# Patient Record
Sex: Female | Born: 1945
Health system: Southern US, Community
[De-identification: ages and names within clinical notes are randomized; demographics above are authoritative.]

## PROBLEM LIST (undated history)

## (undated) DIAGNOSIS — H6690 Otitis media, unspecified, unspecified ear: Secondary | ICD-10-CM

## (undated) DIAGNOSIS — E7800 Pure hypercholesterolemia, unspecified: Secondary | ICD-10-CM

## (undated) DIAGNOSIS — C189 Malignant neoplasm of colon, unspecified: Secondary | ICD-10-CM

## (undated) DIAGNOSIS — F419 Anxiety disorder, unspecified: Secondary | ICD-10-CM

## (undated) HISTORY — DX: Anxiety disorder, unspecified: F41.9

## (undated) HISTORY — DX: Malignant neoplasm of colon, unspecified: C18.9

## (undated) HISTORY — DX: Otitis media, unspecified, unspecified ear: H66.90

## (undated) HISTORY — DX: Pure hypercholesterolemia, unspecified: E78.00

---

## 1977-09-20 HISTORY — PX: VAGINAL HYSTERECTOMY: SHX2639

## 2000-09-29 ENCOUNTER — Other Ambulatory Visit: Admission: RE | Admit: 2000-09-29 | Discharge: 2000-09-29 | Payer: Self-pay | Admitting: Obstetrics and Gynecology

## 2000-09-29 ENCOUNTER — Encounter (INDEPENDENT_AMBULATORY_CARE_PROVIDER_SITE_OTHER): Payer: Self-pay | Admitting: Specialist

## 2001-12-04 ENCOUNTER — Ambulatory Visit (HOSPITAL_COMMUNITY): Admission: RE | Admit: 2001-12-04 | Discharge: 2001-12-04 | Payer: Self-pay | Admitting: Family Medicine

## 2001-12-04 ENCOUNTER — Encounter: Payer: Self-pay | Admitting: Family Medicine

## 2002-03-28 ENCOUNTER — Other Ambulatory Visit: Admission: RE | Admit: 2002-03-28 | Discharge: 2002-03-28 | Payer: Self-pay | Admitting: Obstetrics and Gynecology

## 2003-11-15 ENCOUNTER — Other Ambulatory Visit: Admission: RE | Admit: 2003-11-15 | Discharge: 2003-11-15 | Payer: Self-pay | Admitting: Obstetrics and Gynecology

## 2004-11-09 ENCOUNTER — Other Ambulatory Visit: Admission: RE | Admit: 2004-11-09 | Discharge: 2004-11-09 | Payer: Self-pay | Admitting: Obstetrics and Gynecology

## 2005-01-22 ENCOUNTER — Ambulatory Visit (HOSPITAL_COMMUNITY): Admission: RE | Admit: 2005-01-22 | Discharge: 2005-01-22 | Payer: Self-pay | Admitting: Gastroenterology

## 2005-01-22 ENCOUNTER — Encounter (INDEPENDENT_AMBULATORY_CARE_PROVIDER_SITE_OTHER): Payer: Self-pay | Admitting: *Deleted

## 2005-01-28 ENCOUNTER — Encounter: Admission: RE | Admit: 2005-01-28 | Discharge: 2005-01-28 | Payer: Self-pay | Admitting: Gastroenterology

## 2005-02-08 ENCOUNTER — Ambulatory Visit (HOSPITAL_COMMUNITY): Admission: RE | Admit: 2005-02-08 | Discharge: 2005-02-08 | Payer: Self-pay | Admitting: Gastroenterology

## 2005-02-09 ENCOUNTER — Encounter (INDEPENDENT_AMBULATORY_CARE_PROVIDER_SITE_OTHER): Payer: Self-pay | Admitting: Specialist

## 2005-02-09 ENCOUNTER — Inpatient Hospital Stay (HOSPITAL_COMMUNITY): Admission: RE | Admit: 2005-02-09 | Discharge: 2005-02-15 | Payer: Self-pay | Admitting: Surgery

## 2005-02-12 ENCOUNTER — Ambulatory Visit: Payer: Self-pay | Admitting: Hematology and Oncology

## 2005-03-01 ENCOUNTER — Ambulatory Visit (HOSPITAL_COMMUNITY): Admission: RE | Admit: 2005-03-01 | Discharge: 2005-03-01 | Payer: Self-pay | Admitting: Hematology and Oncology

## 2005-03-05 ENCOUNTER — Ambulatory Visit (HOSPITAL_COMMUNITY): Admission: RE | Admit: 2005-03-05 | Discharge: 2005-03-05 | Payer: Self-pay | Admitting: Surgery

## 2005-03-05 ENCOUNTER — Ambulatory Visit (HOSPITAL_BASED_OUTPATIENT_CLINIC_OR_DEPARTMENT_OTHER): Admission: RE | Admit: 2005-03-05 | Discharge: 2005-03-05 | Payer: Self-pay | Admitting: Surgery

## 2005-04-05 ENCOUNTER — Ambulatory Visit: Payer: Self-pay | Admitting: Hematology and Oncology

## 2005-05-28 ENCOUNTER — Ambulatory Visit: Payer: Self-pay | Admitting: Hematology and Oncology

## 2005-06-22 ENCOUNTER — Ambulatory Visit (HOSPITAL_COMMUNITY): Admission: RE | Admit: 2005-06-22 | Discharge: 2005-06-22 | Payer: Self-pay | Admitting: Hematology and Oncology

## 2005-07-13 ENCOUNTER — Ambulatory Visit: Payer: Self-pay | Admitting: Hematology and Oncology

## 2005-09-06 ENCOUNTER — Ambulatory Visit: Payer: Self-pay | Admitting: Hematology and Oncology

## 2005-10-04 ENCOUNTER — Ambulatory Visit (HOSPITAL_COMMUNITY): Admission: RE | Admit: 2005-10-04 | Discharge: 2005-10-04 | Payer: Self-pay | Admitting: Hematology and Oncology

## 2005-10-11 ENCOUNTER — Ambulatory Visit (HOSPITAL_BASED_OUTPATIENT_CLINIC_OR_DEPARTMENT_OTHER): Admission: RE | Admit: 2005-10-11 | Discharge: 2005-10-11 | Payer: Self-pay | Admitting: Surgery

## 2006-01-05 ENCOUNTER — Ambulatory Visit: Payer: Self-pay | Admitting: Hematology and Oncology

## 2006-01-06 LAB — COMPREHENSIVE METABOLIC PANEL
ALT: 37 U/L (ref 0–40)
Alkaline Phosphatase: 115 U/L (ref 39–117)
CO2: 27 mEq/L (ref 19–32)
Creatinine, Ser: 0.7 mg/dL (ref 0.4–1.2)
Potassium: 4.3 mEq/L (ref 3.5–5.3)
Total Bilirubin: 1.1 mg/dL (ref 0.3–1.2)
Total Protein: 7.3 g/dL (ref 6.0–8.3)

## 2006-01-06 LAB — CBC WITH DIFFERENTIAL/PLATELET
Basophils Absolute: 0 10*3/uL (ref 0.0–0.1)
Eosinophils Absolute: 0.1 10*3/uL (ref 0.0–0.5)
HCT: 37.6 % (ref 34.8–46.6)
HGB: 13 g/dL (ref 11.6–15.9)
MONO#: 0.4 10*3/uL (ref 0.1–0.9)
NEUT#: 2.2 10*3/uL (ref 1.5–6.5)
NEUT%: 55.6 % (ref 39.6–76.8)
RDW: 13.2 % (ref 11.3–14.5)
lymph#: 1.2 10*3/uL (ref 0.9–3.3)

## 2006-01-11 ENCOUNTER — Ambulatory Visit (HOSPITAL_COMMUNITY): Admission: RE | Admit: 2006-01-11 | Discharge: 2006-01-11 | Payer: Self-pay | Admitting: Hematology and Oncology

## 2006-02-15 ENCOUNTER — Other Ambulatory Visit: Admission: RE | Admit: 2006-02-15 | Discharge: 2006-02-15 | Payer: Self-pay | Admitting: Obstetrics and Gynecology

## 2006-03-01 ENCOUNTER — Ambulatory Visit (HOSPITAL_COMMUNITY): Admission: RE | Admit: 2006-03-01 | Discharge: 2006-03-01 | Payer: Self-pay | Admitting: Endocrinology

## 2006-03-14 ENCOUNTER — Ambulatory Visit (HOSPITAL_COMMUNITY): Admission: RE | Admit: 2006-03-14 | Discharge: 2006-03-14 | Payer: Self-pay | Admitting: Endocrinology

## 2006-03-14 ENCOUNTER — Encounter (INDEPENDENT_AMBULATORY_CARE_PROVIDER_SITE_OTHER): Payer: Self-pay | Admitting: Specialist

## 2006-05-06 ENCOUNTER — Ambulatory Visit: Payer: Self-pay | Admitting: Hematology and Oncology

## 2006-05-13 LAB — CBC WITH DIFFERENTIAL/PLATELET
Basophils Absolute: 0 10*3/uL (ref 0.0–0.1)
EOS%: 2.5 % (ref 0.0–7.0)
Eosinophils Absolute: 0.1 10*3/uL (ref 0.0–0.5)
LYMPH%: 35.4 % (ref 14.0–48.0)
MCV: 86.1 fL (ref 81.0–101.0)
MONO#: 0.5 10*3/uL (ref 0.1–0.9)
NEUT#: 2.1 10*3/uL (ref 1.5–6.5)
NEUT%: 50.1 % (ref 39.6–76.8)
RBC: 4.33 10*6/uL (ref 3.70–5.32)
RDW: 14 % (ref 11.3–14.5)
WBC: 4.2 10*3/uL (ref 3.9–10.0)

## 2006-05-13 LAB — COMPREHENSIVE METABOLIC PANEL
BUN: 18 mg/dL (ref 6–23)
CO2: 29 mEq/L (ref 19–32)
Calcium: 9.6 mg/dL (ref 8.4–10.5)
Chloride: 99 mEq/L (ref 96–112)
Creatinine, Ser: 0.69 mg/dL (ref 0.40–1.20)
Total Bilirubin: 1.4 mg/dL — ABNORMAL HIGH (ref 0.3–1.2)

## 2006-05-13 LAB — CEA: CEA: 1.2 ng/mL (ref 0.0–5.0)

## 2006-05-19 ENCOUNTER — Ambulatory Visit (HOSPITAL_COMMUNITY): Admission: RE | Admit: 2006-05-19 | Discharge: 2006-05-19 | Payer: Self-pay | Admitting: Hematology and Oncology

## 2006-05-24 ENCOUNTER — Ambulatory Visit (HOSPITAL_COMMUNITY): Admission: RE | Admit: 2006-05-24 | Discharge: 2006-05-24 | Payer: Self-pay | Admitting: Hematology and Oncology

## 2006-08-18 ENCOUNTER — Ambulatory Visit: Payer: Self-pay | Admitting: Hematology and Oncology

## 2006-08-22 LAB — CBC WITH DIFFERENTIAL/PLATELET
BASO%: 0.7 % (ref 0.0–2.0)
Basophils Absolute: 0 10*3/uL (ref 0.0–0.1)
EOS%: 3.9 % (ref 0.0–7.0)
HCT: 37.4 % (ref 34.8–46.6)
HGB: 13 g/dL (ref 11.6–15.9)
LYMPH%: 34.6 % (ref 14.0–48.0)
MCH: 30 pg (ref 26.0–34.0)
MCHC: 34.8 g/dL (ref 32.0–36.0)
MCV: 86.3 fL (ref 81.0–101.0)
MONO%: 11.1 % (ref 0.0–13.0)
NEUT%: 49.7 % (ref 39.6–76.8)

## 2006-08-22 LAB — COMPREHENSIVE METABOLIC PANEL
AST: 23 U/L (ref 0–37)
Alkaline Phosphatase: 77 U/L (ref 39–117)
BUN: 17 mg/dL (ref 6–23)
Calcium: 9.2 mg/dL (ref 8.4–10.5)
Creatinine, Ser: 0.64 mg/dL (ref 0.40–1.20)
Total Bilirubin: 0.7 mg/dL (ref 0.3–1.2)

## 2006-08-23 ENCOUNTER — Ambulatory Visit (HOSPITAL_COMMUNITY): Admission: RE | Admit: 2006-08-23 | Discharge: 2006-08-23 | Payer: Self-pay | Admitting: Hematology and Oncology

## 2006-12-14 ENCOUNTER — Ambulatory Visit: Payer: Self-pay | Admitting: Hematology and Oncology

## 2006-12-16 LAB — CBC WITH DIFFERENTIAL/PLATELET
Eosinophils Absolute: 0.2 10*3/uL (ref 0.0–0.5)
HCT: 39.2 % (ref 34.8–46.6)
LYMPH%: 28.1 % (ref 14.0–48.0)
MCHC: 35.4 g/dL (ref 32.0–36.0)
MCV: 85.5 fL (ref 81.0–101.0)
MONO#: 0.7 10*3/uL (ref 0.1–0.9)
MONO%: 10.4 % (ref 0.0–13.0)
NEUT#: 4.2 10*3/uL (ref 1.5–6.5)
NEUT%: 58.4 % (ref 39.6–76.8)
Platelets: 161 10*3/uL (ref 145–400)
WBC: 7.2 10*3/uL (ref 3.9–10.0)

## 2006-12-16 LAB — COMPREHENSIVE METABOLIC PANEL
Alkaline Phosphatase: 85 U/L (ref 39–117)
CO2: 27 mEq/L (ref 19–32)
Creatinine, Ser: 0.67 mg/dL (ref 0.40–1.20)
Glucose, Bld: 118 mg/dL — ABNORMAL HIGH (ref 70–99)
Total Bilirubin: 1.3 mg/dL — ABNORMAL HIGH (ref 0.3–1.2)

## 2006-12-16 LAB — CEA: CEA: 0.8 ng/mL (ref 0.0–5.0)

## 2006-12-22 ENCOUNTER — Encounter: Admission: RE | Admit: 2006-12-22 | Discharge: 2006-12-22 | Payer: Self-pay | Admitting: Hematology and Oncology

## 2007-02-21 ENCOUNTER — Ambulatory Visit (HOSPITAL_BASED_OUTPATIENT_CLINIC_OR_DEPARTMENT_OTHER): Admission: RE | Admit: 2007-02-21 | Discharge: 2007-02-21 | Payer: Self-pay | Admitting: Orthopedic Surgery

## 2007-04-05 ENCOUNTER — Other Ambulatory Visit: Admission: RE | Admit: 2007-04-05 | Discharge: 2007-04-05 | Payer: Self-pay | Admitting: Obstetrics and Gynecology

## 2007-06-16 ENCOUNTER — Ambulatory Visit: Payer: Self-pay | Admitting: Hematology and Oncology

## 2007-06-21 LAB — COMPREHENSIVE METABOLIC PANEL
Alkaline Phosphatase: 77 U/L (ref 39–117)
BUN: 17 mg/dL (ref 6–23)
CO2: 27 mEq/L (ref 19–32)
Creatinine, Ser: 0.74 mg/dL (ref 0.40–1.20)
Glucose, Bld: 157 mg/dL — ABNORMAL HIGH (ref 70–99)
Total Bilirubin: 1.4 mg/dL — ABNORMAL HIGH (ref 0.3–1.2)
Total Protein: 7.2 g/dL (ref 6.0–8.3)

## 2007-06-21 LAB — CBC WITH DIFFERENTIAL/PLATELET
BASO%: 0.7 % (ref 0.0–2.0)
Basophils Absolute: 0 10*3/uL (ref 0.0–0.1)
Eosinophils Absolute: 0.2 10*3/uL (ref 0.0–0.5)
HCT: 37.6 % (ref 34.8–46.6)
HGB: 13.2 g/dL (ref 11.6–15.9)
MCHC: 35 g/dL (ref 32.0–36.0)
MONO#: 0.5 10*3/uL (ref 0.1–0.9)
NEUT#: 2 10*3/uL (ref 1.5–6.5)
NEUT%: 48.5 % (ref 39.6–76.8)
WBC: 4.2 10*3/uL (ref 3.9–10.0)
lymph#: 1.4 10*3/uL (ref 0.9–3.3)

## 2007-06-21 LAB — LACTATE DEHYDROGENASE: LDH: 172 U/L (ref 94–250)

## 2007-06-23 ENCOUNTER — Encounter: Admission: RE | Admit: 2007-06-23 | Discharge: 2007-06-23 | Payer: Self-pay | Admitting: Hematology and Oncology

## 2007-06-28 LAB — CEA: CEA: 1 ng/mL (ref 0.0–5.0)

## 2007-06-29 ENCOUNTER — Ambulatory Visit (HOSPITAL_COMMUNITY): Admission: RE | Admit: 2007-06-29 | Discharge: 2007-06-29 | Payer: Self-pay | Admitting: Hematology and Oncology

## 2007-12-19 ENCOUNTER — Ambulatory Visit: Payer: Self-pay | Admitting: Hematology and Oncology

## 2008-01-12 LAB — CBC WITH DIFFERENTIAL/PLATELET
BASO%: 0.6 % (ref 0.0–2.0)
EOS%: 2.3 % (ref 0.0–7.0)
HCT: 36.6 % (ref 34.8–46.6)
MCH: 29.6 pg (ref 26.0–34.0)
MCHC: 35 g/dL (ref 32.0–36.0)
MONO#: 0.4 10*3/uL (ref 0.1–0.9)
NEUT%: 56.6 % (ref 39.6–76.8)
RBC: 4.33 10*6/uL (ref 3.70–5.32)
WBC: 4.3 10*3/uL (ref 3.9–10.0)
lymph#: 1.3 10*3/uL (ref 0.9–3.3)

## 2008-01-12 LAB — COMPREHENSIVE METABOLIC PANEL
ALT: 35 U/L (ref 0–35)
AST: 33 U/L (ref 0–37)
Calcium: 9.5 mg/dL (ref 8.4–10.5)
Chloride: 100 mEq/L (ref 96–112)
Creatinine, Ser: 0.58 mg/dL (ref 0.40–1.20)
Sodium: 137 mEq/L (ref 135–145)
Total Bilirubin: 1.6 mg/dL — ABNORMAL HIGH (ref 0.3–1.2)
Total Protein: 7 g/dL (ref 6.0–8.3)

## 2008-01-15 ENCOUNTER — Encounter: Admission: RE | Admit: 2008-01-15 | Discharge: 2008-01-15 | Payer: Self-pay | Admitting: Hematology and Oncology

## 2008-04-17 ENCOUNTER — Other Ambulatory Visit: Admission: RE | Admit: 2008-04-17 | Discharge: 2008-04-17 | Payer: Self-pay | Admitting: Obstetrics and Gynecology

## 2008-08-28 ENCOUNTER — Ambulatory Visit: Payer: Self-pay | Admitting: Hematology and Oncology

## 2008-08-30 LAB — CBC WITH DIFFERENTIAL/PLATELET
BASO%: 0.6 % (ref 0.0–2.0)
Eosinophils Absolute: 0.1 10*3/uL (ref 0.0–0.5)
LYMPH%: 32.8 % (ref 14.0–48.0)
MCHC: 34.8 g/dL (ref 32.0–36.0)
MONO#: 0.4 10*3/uL (ref 0.1–0.9)
NEUT#: 1.8 10*3/uL (ref 1.5–6.5)
Platelets: 126 10*3/uL — ABNORMAL LOW (ref 145–400)
RBC: 4.14 10*6/uL (ref 3.70–5.32)
RDW: 13.8 % (ref 11.3–14.5)
WBC: 3.5 10*3/uL — ABNORMAL LOW (ref 3.9–10.0)
lymph#: 1.1 10*3/uL (ref 0.9–3.3)

## 2008-08-30 LAB — COMPREHENSIVE METABOLIC PANEL
ALT: 24 U/L (ref 0–35)
Albumin: 4.3 g/dL (ref 3.5–5.2)
CO2: 28 mEq/L (ref 19–32)
Calcium: 9.3 mg/dL (ref 8.4–10.5)
Chloride: 100 mEq/L (ref 96–112)
Glucose, Bld: 140 mg/dL — ABNORMAL HIGH (ref 70–99)
Potassium: 4.6 mEq/L (ref 3.5–5.3)
Sodium: 138 mEq/L (ref 135–145)
Total Bilirubin: 1.3 mg/dL — ABNORMAL HIGH (ref 0.3–1.2)
Total Protein: 7 g/dL (ref 6.0–8.3)

## 2008-08-30 LAB — CEA: CEA: 1.6 ng/mL (ref 0.0–5.0)

## 2008-09-03 ENCOUNTER — Encounter: Admission: RE | Admit: 2008-09-03 | Discharge: 2008-09-03 | Payer: Self-pay | Admitting: Hematology and Oncology

## 2009-04-30 ENCOUNTER — Ambulatory Visit: Payer: Self-pay | Admitting: Hematology and Oncology

## 2009-05-02 LAB — CBC WITH DIFFERENTIAL/PLATELET
Basophils Absolute: 0 10*3/uL (ref 0.0–0.1)
Eosinophils Absolute: 0.1 10*3/uL (ref 0.0–0.5)
HGB: 12.2 g/dL (ref 11.6–15.9)
LYMPH%: 23.8 % (ref 14.0–49.7)
MCV: 86.1 fL (ref 79.5–101.0)
MONO#: 0.5 10*3/uL (ref 0.1–0.9)
MONO%: 11.8 % (ref 0.0–14.0)
NEUT#: 2.4 10*3/uL (ref 1.5–6.5)
Platelets: 106 10*3/uL — ABNORMAL LOW (ref 145–400)
WBC: 3.9 10*3/uL (ref 3.9–10.3)

## 2009-05-02 LAB — COMPREHENSIVE METABOLIC PANEL
Albumin: 4.4 g/dL (ref 3.5–5.2)
Alkaline Phosphatase: 74 U/L (ref 39–117)
BUN: 13 mg/dL (ref 6–23)
CO2: 25 mEq/L (ref 19–32)
Glucose, Bld: 137 mg/dL — ABNORMAL HIGH (ref 70–99)
Potassium: 4.4 mEq/L (ref 3.5–5.3)
Total Bilirubin: 1.3 mg/dL — ABNORMAL HIGH (ref 0.3–1.2)
Total Protein: 7.3 g/dL (ref 6.0–8.3)

## 2009-05-02 LAB — CEA: CEA: 1.4 ng/mL (ref 0.0–5.0)

## 2009-05-05 ENCOUNTER — Encounter: Admission: RE | Admit: 2009-05-05 | Discharge: 2009-05-05 | Payer: Self-pay | Admitting: Hematology and Oncology

## 2009-08-27 ENCOUNTER — Other Ambulatory Visit: Admission: RE | Admit: 2009-08-27 | Discharge: 2009-08-27 | Payer: Self-pay | Admitting: Obstetrics and Gynecology

## 2009-08-27 ENCOUNTER — Ambulatory Visit: Payer: Self-pay | Admitting: Obstetrics and Gynecology

## 2009-11-06 ENCOUNTER — Ambulatory Visit: Payer: Self-pay | Admitting: Hematology and Oncology

## 2009-11-11 LAB — COMPREHENSIVE METABOLIC PANEL
CO2: 26 mEq/L (ref 19–32)
Creatinine, Ser: 0.6 mg/dL (ref 0.40–1.20)
Glucose, Bld: 114 mg/dL — ABNORMAL HIGH (ref 70–99)
Total Bilirubin: 1.1 mg/dL (ref 0.3–1.2)

## 2009-11-11 LAB — CBC WITH DIFFERENTIAL/PLATELET
BASO%: 0.6 % (ref 0.0–2.0)
HCT: 35 % (ref 34.8–46.6)
LYMPH%: 26.6 % (ref 14.0–49.7)
MCHC: 34 g/dL (ref 31.5–36.0)
MCV: 87.3 fL (ref 79.5–101.0)
MONO#: 0.5 10*3/uL (ref 0.1–0.9)
MONO%: 11 % (ref 0.0–14.0)
NEUT%: 58.3 % (ref 38.4–76.8)
Platelets: 136 10*3/uL — ABNORMAL LOW (ref 145–400)
WBC: 4.4 10*3/uL (ref 3.9–10.3)

## 2009-11-11 LAB — CEA: CEA: 2 ng/mL (ref 0.0–5.0)

## 2010-05-27 ENCOUNTER — Ambulatory Visit: Payer: Self-pay | Admitting: Cardiology

## 2010-05-28 ENCOUNTER — Ambulatory Visit: Payer: Self-pay | Admitting: Cardiology

## 2010-08-04 ENCOUNTER — Ambulatory Visit (HOSPITAL_COMMUNITY): Admission: RE | Admit: 2010-08-04 | Discharge: 2010-08-04 | Payer: Self-pay | Admitting: Gastroenterology

## 2010-08-04 HISTORY — PX: ESOPHAGOGASTRODUODENOSCOPY: SHX1529

## 2010-08-05 ENCOUNTER — Encounter: Admission: RE | Admit: 2010-08-05 | Discharge: 2010-08-05 | Payer: Self-pay | Admitting: Hematology and Oncology

## 2010-08-06 ENCOUNTER — Ambulatory Visit: Payer: Self-pay | Admitting: Hematology and Oncology

## 2010-08-10 LAB — CBC WITH DIFFERENTIAL/PLATELET
Basophils Absolute: 0 10*3/uL (ref 0.0–0.1)
Eosinophils Absolute: 0.1 10*3/uL (ref 0.0–0.5)
HCT: 23.8 % — ABNORMAL LOW (ref 34.8–46.6)
HGB: 8.1 g/dL — ABNORMAL LOW (ref 11.6–15.9)
MONO#: 0.4 10*3/uL (ref 0.1–0.9)
NEUT%: 60.9 % (ref 38.4–76.8)
WBC: 3.7 10*3/uL — ABNORMAL LOW (ref 3.9–10.3)
lymph#: 0.9 10*3/uL (ref 0.9–3.3)

## 2010-08-10 LAB — COMPREHENSIVE METABOLIC PANEL
ALT: 21 U/L (ref 0–35)
BUN: 11 mg/dL (ref 6–23)
CO2: 27 mEq/L (ref 19–32)
Calcium: 9.3 mg/dL (ref 8.4–10.5)
Chloride: 101 mEq/L (ref 96–112)
Creatinine, Ser: 0.67 mg/dL (ref 0.40–1.20)

## 2010-08-10 LAB — CEA: CEA: 1.1 ng/mL (ref 0.0–5.0)

## 2010-08-18 LAB — CBC WITH DIFFERENTIAL/PLATELET
Basophils Absolute: 0 10*3/uL (ref 0.0–0.1)
EOS%: 1.9 % (ref 0.0–7.0)
HGB: 8.9 g/dL — ABNORMAL LOW (ref 11.6–15.9)
MCH: 25.9 pg (ref 25.1–34.0)
NEUT#: 2.6 10*3/uL (ref 1.5–6.5)
RDW: 15.4 % — ABNORMAL HIGH (ref 11.2–14.5)
lymph#: 1 10*3/uL (ref 0.9–3.3)

## 2010-08-18 LAB — IRON AND TIBC
TIBC: 463 ug/dL (ref 250–470)
UIBC: 436 ug/dL

## 2010-08-18 LAB — VITAMIN B12: Vitamin B-12: 247 pg/mL (ref 211–911)

## 2010-08-28 ENCOUNTER — Encounter
Admission: RE | Admit: 2010-08-28 | Discharge: 2010-08-28 | Payer: Self-pay | Source: Home / Self Care | Attending: Hematology and Oncology | Admitting: Hematology and Oncology

## 2010-09-16 ENCOUNTER — Ambulatory Visit: Payer: Self-pay | Admitting: Hematology and Oncology

## 2010-09-22 ENCOUNTER — Ambulatory Visit: Payer: Self-pay | Admitting: Hematology and Oncology

## 2010-09-22 ENCOUNTER — Encounter: Payer: Self-pay | Admitting: Cardiology

## 2010-09-22 LAB — COMPREHENSIVE METABOLIC PANEL
ALT: 32 U/L (ref 0–35)
AST: 37 U/L (ref 0–37)
Albumin: 4.7 g/dL (ref 3.5–5.2)
Alkaline Phosphatase: 64 U/L (ref 39–117)
BUN: 12 mg/dL (ref 6–23)
CO2: 26 mEq/L (ref 19–32)
Calcium: 9.7 mg/dL (ref 8.4–10.5)
Chloride: 99 mEq/L (ref 96–112)
Creatinine, Ser: 0.68 mg/dL (ref 0.40–1.20)
Glucose, Bld: 135 mg/dL — ABNORMAL HIGH (ref 70–99)
Potassium: 3.8 mEq/L (ref 3.5–5.3)
Sodium: 137 mEq/L (ref 135–145)
Total Bilirubin: 1 mg/dL (ref 0.3–1.2)
Total Protein: 7.1 g/dL (ref 6.0–8.3)

## 2010-09-22 LAB — FERRITIN: Ferritin: 57 ng/mL (ref 10–291)

## 2010-09-22 LAB — CBC WITH DIFFERENTIAL/PLATELET
Basophils Absolute: 0 10*3/uL (ref 0.0–0.1)
Eosinophils Absolute: 0.2 10*3/uL (ref 0.0–0.5)
HCT: 37.6 % (ref 34.8–46.6)
HGB: 12.7 g/dL (ref 11.6–15.9)
LYMPH%: 24.6 % (ref 14.0–49.7)
MCV: 85.8 fL (ref 79.5–101.0)
MONO#: 0.4 10*3/uL (ref 0.1–0.9)
NEUT#: 2.2 10*3/uL (ref 1.5–6.5)
NEUT%: 58.4 % (ref 38.4–76.8)
Platelets: 129 10*3/uL — ABNORMAL LOW (ref 145–400)
RBC: 4.38 10*6/uL (ref 3.70–5.45)
WBC: 3.8 10*3/uL — ABNORMAL LOW (ref 3.9–10.3)

## 2010-09-22 LAB — IRON AND TIBC
%SAT: 21 % (ref 20–55)
Iron: 73 ug/dL (ref 42–145)
TIBC: 342 ug/dL (ref 250–470)
UIBC: 269 ug/dL

## 2010-10-01 ENCOUNTER — Encounter
Admission: RE | Admit: 2010-10-01 | Discharge: 2010-10-01 | Payer: Self-pay | Source: Home / Self Care | Attending: Gastroenterology | Admitting: Gastroenterology

## 2010-10-10 ENCOUNTER — Encounter: Payer: Self-pay | Admitting: Hematology and Oncology

## 2010-10-11 ENCOUNTER — Encounter: Payer: Self-pay | Admitting: Hematology and Oncology

## 2010-11-26 ENCOUNTER — Other Ambulatory Visit (INDEPENDENT_AMBULATORY_CARE_PROVIDER_SITE_OTHER): Payer: 59

## 2010-11-26 DIAGNOSIS — E785 Hyperlipidemia, unspecified: Secondary | ICD-10-CM

## 2010-11-30 ENCOUNTER — Ambulatory Visit (INDEPENDENT_AMBULATORY_CARE_PROVIDER_SITE_OTHER): Payer: 59 | Admitting: Cardiology

## 2010-11-30 DIAGNOSIS — Z79899 Other long term (current) drug therapy: Secondary | ICD-10-CM

## 2010-11-30 DIAGNOSIS — E789 Disorder of lipoprotein metabolism, unspecified: Secondary | ICD-10-CM

## 2010-11-30 DIAGNOSIS — I119 Hypertensive heart disease without heart failure: Secondary | ICD-10-CM

## 2010-12-01 LAB — COMPREHENSIVE METABOLIC PANEL
ALT: 27 U/L (ref 0–35)
AST: 30 U/L (ref 0–37)
Albumin: 3.9 g/dL (ref 3.5–5.2)
Alkaline Phosphatase: 66 U/L (ref 39–117)
BUN: 8 mg/dL (ref 6–23)
Chloride: 104 mEq/L (ref 96–112)
Potassium: 4.2 mEq/L (ref 3.5–5.1)
Total Bilirubin: 1.5 mg/dL — ABNORMAL HIGH (ref 0.3–1.2)

## 2010-12-01 LAB — DIFFERENTIAL
Basophils Absolute: 0 10*3/uL (ref 0.0–0.1)
Basophils Relative: 1 % (ref 0–1)
Eosinophils Absolute: 0.1 10*3/uL (ref 0.0–0.7)
Eosinophils Relative: 2 % (ref 0–5)
Monocytes Absolute: 0.4 10*3/uL (ref 0.1–1.0)
Neutro Abs: 2.3 10*3/uL (ref 1.7–7.7)

## 2010-12-01 LAB — CBC
HCT: 25.4 % — ABNORMAL LOW (ref 36.0–46.0)
MCH: 28.6 pg (ref 26.0–34.0)
MCHC: 34.5 g/dL (ref 30.0–36.0)
MCV: 82.9 fL (ref 78.0–100.0)
RDW: 15.6 % — ABNORMAL HIGH (ref 11.5–15.5)

## 2010-12-01 LAB — PROTIME-INR: INR: 1.1 (ref 0.00–1.49)

## 2010-12-01 LAB — ABO/RH: ABO/RH(D): A POS

## 2010-12-16 ENCOUNTER — Encounter (HOSPITAL_BASED_OUTPATIENT_CLINIC_OR_DEPARTMENT_OTHER): Payer: 59 | Admitting: Hematology and Oncology

## 2010-12-16 ENCOUNTER — Other Ambulatory Visit: Payer: Self-pay | Admitting: Hematology and Oncology

## 2010-12-16 DIAGNOSIS — D509 Iron deficiency anemia, unspecified: Secondary | ICD-10-CM

## 2010-12-16 DIAGNOSIS — C189 Malignant neoplasm of colon, unspecified: Secondary | ICD-10-CM

## 2010-12-16 DIAGNOSIS — C186 Malignant neoplasm of descending colon: Secondary | ICD-10-CM

## 2010-12-17 ENCOUNTER — Other Ambulatory Visit: Payer: Self-pay | Admitting: Cardiology

## 2010-12-17 DIAGNOSIS — E7800 Pure hypercholesterolemia, unspecified: Secondary | ICD-10-CM

## 2010-12-17 NOTE — Telephone Encounter (Signed)
escribe request  

## 2010-12-17 NOTE — Telephone Encounter (Signed)
[facility] °

## 2010-12-31 ENCOUNTER — Other Ambulatory Visit: Payer: Self-pay | Admitting: Cardiology

## 2010-12-31 DIAGNOSIS — E7800 Pure hypercholesterolemia, unspecified: Secondary | ICD-10-CM

## 2011-02-02 NOTE — Op Note (Signed)
NAMEFRANCYS, Amber Crosby                 ACCOUNT NO.:  [id number]   MEDICAL RECORD NO.:  [id number]          PATIENT TYPE:  AMB   LOCATION:  DSC                          FACILITY:  [facility]   PHYSICIAN:  Katy Fitch. Sypher, M.D. DATE OF BIRTH:  [birth year]   DATE OF PROCEDURE:  02/21/2007  DATE OF DISCHARGE:                               OPERATIVE REPORT   PREOPERATIVE DIAGNOSIS:  Chronic stenosing tenosynovitis, right long  finger at A1 pulley with development of early flexion contracture, right  long finger proximal interphalangeal joint.   POSTOPERATIVE DIAGNOSIS:  Chronic stenosing tenosynovitis, right long  finger at A1 pulley with development of early flexion contracture, right  long finger proximal interphalangeal joint.   OPERATIONS:  Release of right long finger A1 pulley and examination of  long finger under anesthesia noting relief of flexion contracture after  release of A1 pulley and limited synovectomy.   OPERATIONS:  Dr. Josephine Igo.   ASSISTANT:  Annye Rusk PA-C.   ANESTHESIA:  2% lidocaine metacarpal head level block right long finger  supplemented by IV sedation, supervising anesthesiologist Dr. Krista Blue.   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 Ronny Flurry for evaluation and  management of locking of her right long finger at the A1 pulley.  Amber Crosby  has a history of type 2 diabetes.  She has had chronic stenosing  tenosynovitis treated in the past with injection and activity  modification.   Due to failure to respond to nonoperative measures, she is brought to  the operating room at this time for release of her right long finger A1  pulley.  Preoperatively she was noted to be developing a flexion  contracture of the right long finger PIP joint.  We will examine her  finger under anesthesia to determine if this is due to capsular  contracture or simply due to loss of excursion of her  superficialis  tendon.   After informed consent, she is brought to the operating room at this  time.   PROCEDURE:  Amber Crosby was brought to the operating room and placed in  supine position on the operating table.  Following light sedation her  right arm was prepped with chlorhexidine due to a shellfish allergy and  IVP allergy with concerns about iodine exposure.  The right arm was then  prepped with alcohol and 2% lidocaine infiltrated into the region of the  anticipated incision and into the right long finger flexor sheath.   After a few moments excellent anesthesia of long finger was achieved.   The right arm and hand were exsanguinated with an Esmarch bandage and  the arterial tourniquet on the proximal right brachium inflated to 250  mmHg.  Procedure commenced with short oblique incision in the distal  palmar crease.  Subcutaneous tissues were carefully divided taking care  to identify the A1 pulley.  The A1 pulley was split along its radial  border with scalpel and scissors.  The flexor tendons were delivered and  a cuff of fibrotic tenosynovium removed between the  superficialis  profundus tendons.  Thereafter full passive extension of the PIP joint  was recovered after release of the tethering tenosynovium.   The wound was inspected.  Bleeding points repaired with interrupted  sutures of 5-0 nylon.   There no apparent complications.   Amber Crosby tolerated surgery and anesthesia well.  She was transferred to  recovery room with stable vital signs.   She is discharged home with a prescription for Vicodin 5 mg one p.o. q.4-  6 h p.r.n. pain 20 tablets without refill.   We will see her back for follow-up in 7 days suture removal and  initiation of a structured exercise program.      Katy Fitch. Sypher, M.D.  Electronically Signed     RVS/MEDQ  D:  02/21/2007  T:  02/21/2007  Job:  [id number]

## 2011-02-05 NOTE — Op Note (Signed)
Amber Crosby, Amber Crosby                 ACCOUNT NO.:  [id number]   MEDICAL RECORD NO.:  [id number]          PATIENT TYPE:  AMB   LOCATION:  ENDO                         FACILITY:  [facility]   PHYSICIAN:  Petra Kuba, M.D.    DATE OF BIRTH:  [birth year]   DATE OF PROCEDURE:  01/22/2005  DATE OF DISCHARGE:                                 OPERATIVE REPORT   PROCEDURE:  Colonoscopy.   INDICATIONS:  Family history of colonic polyps.  Due for colonic screening.  Consent was signed after risks, benefits, methods, options were thoroughly  discussed in the office.   MEDICINES USED:  1.  Fentanyl 87.5 mcg.  2.  Versed 10 mg.   PROCEDURE:  Rectal inspection was pertinent for external hemorrhoids.  Digital exam was negative.  The video pediatric adjustable colonoscope was  inserted, and with minimal difficulty due to __________ we were able to  advance to the cecum.  On insertion, rare left-sided diverticula were seen,  but no other abnormalities.  To advance to the cecum required rolling her on  her back and some abdominal pressure.  The cecum was identified by the  appendiceal orifice and the ileocecal valve.  In fact, the scope was  inserted a short ways into the terminal ileum, which was normal.  Photodocumentation was obtained.  The scope was slowly withdrawn.  The prep  was adequate.  There was some liquid stool that required washing and  suctioning.  On slow withdraw through the colon, the cecum and the ascending  were normal.  In the hepatic flexure, a small pedunculated polyp was seen,  snared, electrocautery applied, and the polyp was suctioned through the  scope and collected in the trap.  Possibly, there was a little bit of  residual polypoid tissue, which was snared, electrocautery applied, and this  was suctioned through the scope and collected in the trap.  These two pieces  were put in the first container.  The scope was slowly withdrawn.  Other  than a rare left-sided diverticula,  no other abnormalities were seen until  we withdrew back to about 30 cm, at probably the proximal sigmoid and  possibly sigmoid-descending junction, where a 1.5 cm flat ulcerative mass  was seen, and multiple cold biopsies were obtained.  It was probably a  cancerous polyp.  The scope was slowly withdrawn.  The rest of the sigmoid  was normal.  Anorectal pull through and retroflexion confirmed some small  hemorrhoids.  The scope was straightened and readvanced to the mass, which  again confirmed its above-mentioned position.  A few more biopsies were  obtained.  Air was suctioned, scope removed.  The patient tolerated the  procedure adequately.  There was no obvious immediate complication.   ENDOSCOPIC DIAGNOSES:  1.  Internal and external hemorrhoids/  2.  Rare left-sided early diverticula.  3.  At 30 cm, proximal sigmoid, 1.5 cm flat ulcerative mass, status post      biopsy.  4.  Pedunculated small hepatic flexure polyp, status post snare x 2.  5.  Otherwise within normal limits in the  cecum.   PLAN:  Await pathology, but probably will need surgical options on her left-  sided lesion, and will discuss colon screening based on pathology.      MEM/MEDQ  D:  01/22/2005  T:  01/22/2005  Job:  [id number]   cc:   Cassell Clement, M.D.  [address]  [place]  [place] [address]  Fax: [phone]   Rande Brunt. Eda Paschal, M.D.  [address], [address]  [place]  [place] [address]  Fax: [phone]

## 2011-02-05 NOTE — Op Note (Signed)
Amber Crosby, Amber Crosby                 ACCOUNT NO.:  [id number]   MEDICAL RECORD NO.:  [id number]          PATIENT TYPE:  AMB   LOCATION:  DSC                          FACILITY:  [facility]   PHYSICIAN:  Sandria Bales. Ezzard Standing, M.D.  DATE OF BIRTH:  [birth year]   DATE OF PROCEDURE:  03/05/2005  DATE OF DISCHARGE:                                 OPERATIVE REPORT   PREOPERATIVE DIAGNOSIS:  Colon cancer, anticipating chemotherapy.   POSTOPERATIVE DIAGNOSIS:  Colon cancer, anticipating chemotherapy.   OPERATION PERFORMED:  Left subclavian Port-A-Cath.   SURGEON:  Sandria Bales. Ezzard Standing, M.D.   ANESTHESIA:  MAC with about 20 mL of 1% Xylocaine.   COMPLICATIONS:  None.   INDICATIONS FOR PROCEDURE:  Amber Crosby is a 65 year old white female who had a  sigmoid colectomy and was found to have a T1, N1 colon carcinoma.  She has  met Lauretta I. Odogwu, M.D. and is anticipating chemotherapy.  She now  comes to me for placement of Port-A-Cath.  I discussed with her the  indications and potential complications of Port-A-Cath placement.  The  potential complications include but not limited to bleeding, infection,  pneumothorax, and venous thrombosis.   DESCRIPTION OF PROCEDURE:  The patient was taken to the operating room.  Her  upper chest was prepped with Betadine solution and sterilely draped.  She is  given 1 g of Ancef at the initiation of the procedure, both arms were tucked  by her side and a roll placed under her back.  I used the Bard X-Port kit to  access her left subclavian vein and thread a guide wire into this and this  was checked with fluoroscopy.   I then developed a reservoir site in the upper aspect of her left breast,  threaded Silastic tubing from the reservoir to the left subclavian stick  site and introduced the catheter into the subclavian vein using 8 [place]  introducer.  The position was checked with fluoroscopy.  I then attached to  Silastic tubing to the reservoir, flushed the entire unit  first with dilute  heparin, 10 units per cc, then with concentrated heparin 100 units per cc.  The entire unit was checked with fluoroscopy showing good position of the  tip at the junction of the superior vena cava and right atrium.  The port  was sewn in place with 3-0 Vicryl sutures.  The subcutaneous tissues were  closed with 3-0 Vicryl sutures the skin closed with 5-0 Vicryl suture,  painted with tincture of benzoin and steri-stripped.   The patient tolerated the procedure well.  Sponge and needle count were  correct at end of the case.  She is going home today, will see me back in a  couple of weeks.  She is to start her chemotherapy the first week of July.  Chest x-ray is pending at time of this dictation.       DHN/MEDQ  D:  03/05/2005  T:  03/05/2005  Job:  [id number]   cc:   Dario Guardian, M.D.  [address] [place] [address]  [place] [address]  Fax: [phone]   Cassell Clement, M.D.  [address]  [place]  [place] [address]  Fax: [phone]   Petra Kuba, M.D.  [address]  [facility]  [place] [address]  Fax: [phone]   Rande Brunt. Eda Paschal, M.D.  [address], [address]  [place]  [place] [address]  Fax: [phone]   Vicente Serene I. Odogwu, M.D.  Fax: [phone]

## 2011-02-05 NOTE — Op Note (Signed)
Amber Crosby, Amber Crosby                 ACCOUNT NO.:  [id number]   MEDICAL RECORD NO.:  [id number]          PATIENT TYPE:  AMB   LOCATION:  ENDO                         FACILITY:  [facility]   PHYSICIAN:  Petra Kuba, M.D.    DATE OF BIRTH:  19-Sep-1946   DATE OF PROCEDURE:  02/08/2005  DATE OF DISCHARGE:                                 OPERATIVE REPORT   PROCEDURE:  Flexible sigmoidoscopy with injection therapy.   INDICATIONS:  Sigmoid cancer, one preop marking. Consent was signed after  risks, benefits, methods, options were thoroughly discussed.   MEDICINES USED:  Fentanyl 87.5, Versed 9.   DESCRIPTION OF PROCEDURE:  Rectal inspection is pertinent for external  hemorrhoids, small. Digital exam was negative. The video pediatric  adjustable colonoscope was inserted to 35 cm easily and the lesion was seen  at about 33 cm. It seemed to be unchanged. No other abnormalities were seen  on insertion. We went ahead and injected in the customary fashion spot  injection,  a total of three injections and a total of 2 mL were used. We  got a good bled on two of the injections and a small bleb on one of the  injections. We elected to withdraw at this juncture. On slow withdrawal back  to the rectum, no abnormalities were seen. There was a little stool that  required washing and suctioning. Anorectal pull-through and retroflexion  confirmed the small hemorrhoids. Scope was straightened, air was suctioned,  scope removed. The patient tolerated the procedure well. There was no  obvious or immediate complication.   ENDOSCOPIC DIAGNOSIS:  1.  Internal and external small hemorrhoids.  2.  Proximal sigmoid ulcerative mass at about 33 cm status post injection      with spot, three injections with a total of 2 mL.  3.  Otherwise within normal limits to the sigmoid only.   PLAN:  Await surgical pathology. I will be on standby to help p.r.n.,  otherwise probably repeat colonoscopy in one year.      MEM/MEDQ  D:  02/08/2005  T:  02/08/2005  Job:  [id number]   cc:   Sandria Bales. Ezzard Standing, M.D.  [address]  [place]  [place] [address]   Cassell Clement, M.D.  [address]  [place]  [place] [address]  Fax: [phone]   Rande Brunt. Eda Paschal, M.D.  [address]  [place]  [place] [address]  Fax: [phone]

## 2011-02-05 NOTE — Op Note (Signed)
NAMEJONATHON, CASTELO                 ACCOUNT NO.:  [id number]   MEDICAL RECORD NO.:  [id number]          PATIENT TYPE:  INP   LOCATION:  X010                         [facility]ACILITY:  [facility]   PHYSICIAN:  Sandria Bales. Ezzard Standing, M.D.  DATE OF BIRTH:  04-Oct-1945   DATE OF PROCEDURE:  02/09/2005  DATE OF DISCHARGE:                                 OPERATIVE REPORT   PREOPERATIVE DIAGNOSIS:  Sigmoid colon carcinoma, 30 cm from the anal verge.   POSTOPERATIVE DIAGNOSIS:  Mid sigmoid colon carcinoma, 30 cm from the anal  verge.   PROCEDURE:  Laparoscopic-assisted sigmoid colectomy.   SURGEON:  Dr. Ezzard Standing.   FIRST ASSISTANT:  Dr. Danna Hefty.   ANESTHESIA:  General with approximately 45 cc of 0.25% Marcaine with epi as  an infiltrate.   ESTIMATED BLOOD LOSS:  250 cc.   DRAINS:  Telfa wicks.   OPERATIVE NOTE:  Ms. Sistare is a 65 year old white female who is a patient of  Dr. Archie Endo, Dr. Caesar Chestnut, and Dr. Ronny Flurry. She had a  screening colonoscopy by Dr. Vida Rigger and was found to have a lesion 30 cm  from her anal verge which was biopsy proven as adenocarcinoma.  CT scan  showed a small hiatal hernia but no evidence of metastatic disease.  She now  comes for laparoscopic-assisted sigmoid colectomy.   The indications and potential complications were explained to the patient.  Potential complications included but were not limited to bleeding,  infection, leak from the bowel, possible recurrent tumor.  I had to size of  the incision on very little but on how much I could do laparoscopically and  how much I had to do open.   Patient had completed a mechanical bowel prep at home the day before her  colonoscopy.  The day before her surgery, Dr. Ewing Schlein did a colonoscopy to  mark the location of the tumor.  She presents after a bowel prep with the  bowel now marked.  She is given a general endotracheal anesthetic and 1 gm  of Mefoxin at the initiation of the procedure.  Foley  catheter was then  placed.  She was placed in a lithotomy position.   The abdomen and perineum were prepped with Betadine solution and sterilely  draped.  She had a Foley catheter in place, and an NG tube in place.  I went  through an infraumbilical incision with a 0 degree, 10 mm Hasson trocar.  I  then put a 5 mm in the right lower quadrant and a 5 mm in the left upper  quadrant.  I did an abdominal exploration.  The right and left lobes of the  liver were unremarkable.  The anterior wall of the stomach was unremarkable.  She had a lot of omentum which covered much of her small bowel, but I could  see a tattooed colon in kind of a proximal-to-mid sigmoid colon.  I was able  to mobilize the left colon up midway up the left colon, reflecting this  medially.  I was able to free up adhesions in the sigmoid  colon to the  pelvic brim.  I was able to identify the ureter laparoscopically.  At this  point, I thought I had adequate mobilization to perform the operation  infraumbilical and went through a lower midline abdominal incision.   To remove the trocars, I made a lower incision.  I again identified the  colon.  I found where the ureter crossed the pelvic brim, which was well  posterior to where we were dissecting.  I then went at least 10 cm proximal  and 10 cm distal to the marked tumor, and I resected the mesentery down to  the pelvic brim.   I used Kocher clamps for the dissection.  I then did an end-to-end hand-sewn  anastomosis with 2-0 silk suture.  This allowed a fingerbreadth through the  colon.  I did inverting of the sutures except for the anterior, where I did  Gambee with 2-0 silk.  I irrigated down with four liters of saline and  closed the mesenteric defect with a 2-0 silk suture.  The colon actually lay  over the pelvic brim, as it was straightened out.   I then returned the small bowel to the lower abdomen, returned the omentum  anterior to the small bowel and then  closed the lower abdomen with two  running #1 PDS sutures.  She did have at least probably a 2 inch  subcutaneous layer of fat and then skin staples through the skin, but I did  place two Telfa wicks to the wound to help drain this out because of the  thickness of her subcutaneous space.  Certainly, she is at risk for wound  infection with all that space.  She was then sterilely dressed with 4x4s and  Hypafix.  Transported to the recovery room in good condition.  The sponge  and needle counts were correct at the end of the case.  Again, the specimens  were removed.  I did open on the table and identified the tumor, which Dr.  Ewing Schlein had done a good job of marking colonoscopically.  It was in the middle  of the specimen.  The tumor actually did not look more than about 1.5 cm in  size.  I thought I would get an adequate mesenteric apron with this and then  was sent to pathology.      DHN/MEDQ  D:  02/09/2005  T:  02/09/2005  Job:  [id number]   cc:   Petra Kuba, M.D.  [address]  [place]  [place] [address]  Fax: [phone]   Dario Guardian, M.D.  [address]  [place]  [place] [address]  Fax: [phone]   Cassell Clement, M.D.  [address]  [place]  [place] [address]  Fax: [phone]   Rande Brunt. Eda Paschal, M.D.  [address]  [place]  [place] [address]  Fax: [phone]

## 2011-02-05 NOTE — Discharge Summary (Signed)
NAMECARALINA, NOP                 ACCOUNT NO.:  [id number]   MEDICAL RECORD NO.:  [id number]          PATIENT TYPE:  INP   LOCATION:  1607                         FACILITY:  [facility]   PHYSICIAN:  Sandria Bales. Ezzard Standing, M.D.  DATE OF BIRTH:  [birth year]   DATE OF ADMISSION:  02/09/2005  DATE OF DISCHARGE:  02/15/2005                                 DISCHARGE SUMMARY   DISCHARGE DIAGNOSES:  1.  Colonic adenocarcinoma 1 cm in size invading to the submucosa, 3 of 10      nodes positive (T1 N1 M0).  2.  Hypertension.  3.  Hypercholesterolemia.  4.  Non-insulin-dependent diabetes mellitus.  5.  Hiatal hernia.  6.  Status post lumbar laminectomy.   OPERATIONS PERFORMED:  The patient had a laparoscopically-assisted sigmoid  colectomy on Feb 09, 2005.   HISTORY OF PRESENT ILLNESS:  Ms. Schow is a pleasant 65 year old white female  who is a retired Engineer, civil (consulting) from [employer] who sees Dr. Archie Endo as  her primary medical doctor, Dr. Ronny Flurry from a hypertension/  cardiology standpoint. Underwent a screening colonoscopy by Dr. Leary Roca  and was found to have a superficial-appearing lesion at approximately 30 cm  from the anal verge.   HOSPITAL COURSE:  The patient completed a physiologic bowel prep. Dr. Ewing Schlein  marked this colonoscopically the day before surgery by injecting dye into  the mucosa of the colon, and the patient then completed an antibiotic prep  and presented the morning of the operation for surgery.   She underwent a laparoscopically-assisted sigmoid colectomy. At the time of  laparoscopy I identified the stained sigmoid colon and resected this segment  of the colon.   Postoperatively, she did well. Her first postoperative day her temperature  was 98.7. Her sodium 133, potassium 5.4, chloride of 100, creatinine of 0.6.  Hemoglobin 12; white blood cell count of 10,900. Her Foley was removed on  postoperative day #2. This was left in a little bit because she had some  dizziness early after surgery which we think may have been due to her beta  blockers; we held these.   She also had a little bit of an injury to her tongue and was given Magic  Mouthwash. By postoperative day #3 her dizziness was better, her tongue was  better, I had given her Toradol for pain, and we started her on a diet.   By postoperative day #6 she was afebrile, her wound looked good, she had  passed flatus, and was ready for discharge.   Her final pathology report revealed a 1 cm T1 primary carcinoma but,  unfortunately, she had three of 10 nodes positive with metastatic carcinoma.   She has requested Dr. Marikay Alar Magrinat as an oncology consult and arrangements  will be made to see him from an oncology standpoint. Her CEA drawn on May 26  was less than 0.5, and she was ready for discharge.   DISCHARGE INSTRUCTIONS:  Include Vicodin for pain. She was to have no heavy  lifting beyond about 15 pounds. She could shower. She will see me  back in  the office in 1-2 weeks and have follow-up with Dr. Darnelle Catalan for adjuvant  chemotherapy.       DHN/MEDQ  D:  04/19/2005  T:  04/19/2005  Job:  [id number]   cc:   Dario Guardian, M.D.  Fax: [phone]   Cassell Clement, M.D.  [address]  [place]  [place] [address]  Fax: [phone]   Petra Kuba, M.D.  [address]  [place]  [place] [address]  Fax: [phone]   Valentino Hue. Magrinat, M.D.  [address] [facility]  [place]  [place] [address]  Fax: [phone]

## 2011-02-05 NOTE — Op Note (Signed)
NAMEQUINCIE, HAROON                 ACCOUNT NO.:  [id number]   MEDICAL RECORD NO.:  [id number]          PATIENT TYPE:  AMB   LOCATION:  DSC                          FACILITY:  [facility]   PHYSICIAN:  Sandria Bales. Ezzard Standing, M.D.  DATE OF BIRTH:  02-Feb-1946   DATE OF PROCEDURE:  10/11/2005  DATE OF DISCHARGE:                                 OPERATIVE REPORT   PREOPERATIVE DIAGNOSIS:  Port-A-Cath, completion of chemotherapy.   POSTOPERATIVE DIAGNOSIS:  Left subclavian Port-A-Cath, completion of  chemotherapy.   OPERATION PERFORMED:  Removal of left subclavian Port-A-Cath.   SURGEON:  Sandria Bales. Ezzard Standing, M.D.   ASSISTANT:  None.   ANESTHESIA:  20 mL of local.   COMPLICATIONS:  None.   INDICATIONS FOR PROCEDURE:  Ms. Coventry is a 65 year old white female who has  completed her chemotherapy for colon cancer and now comes for removal of her  Port-A-Cath.   DESCRIPTION OF PROCEDURE:  The patient placed in supine position.  Her left  chest prepped with Betadine solution and sterilely draped.  The skin over  the Port-A-Cath was infiltrated with about 20 mL of 0.25% Marcaine.  An  incision was then made directly down on top of the Port-A-Cath.  The Port-A-  Cath was removed in its entirety.  The patient did not want to keep this and  it was thrown in the trash.   The wound was then closed with 3-0 Vicryl suture, 5-0 Vicryl suture, and  painted with tincture of benzoin and Steri-Strips.  The patient tolerated  the procedure well.  She will see me back in two or three weeks for just a  routine follow-up and wound check.      Sandria Bales. Ezzard Standing, M.D.  Electronically Signed     DHN/MEDQ  D:  10/11/2005  T:  10/11/2005  Job:  [id number]   cc:   Vicente Serene I. Odogwu, M.D.  Fax: [phone]   Cassell Clement, M.D.  Fax: [phone]   Petra Kuba, M.D.  Fax: [phone]

## 2011-04-10 ENCOUNTER — Other Ambulatory Visit: Payer: Self-pay | Admitting: Cardiology

## 2011-04-10 DIAGNOSIS — I119 Hypertensive heart disease without heart failure: Secondary | ICD-10-CM

## 2011-04-13 NOTE — Telephone Encounter (Signed)
escribe request  

## 2011-04-21 ENCOUNTER — Other Ambulatory Visit: Payer: Self-pay | Admitting: Hematology and Oncology

## 2011-04-21 ENCOUNTER — Other Ambulatory Visit: Payer: Self-pay | Admitting: Cardiology

## 2011-04-21 ENCOUNTER — Encounter: Payer: Medicare Other | Admitting: Hematology and Oncology

## 2011-04-21 DIAGNOSIS — D649 Anemia, unspecified: Secondary | ICD-10-CM

## 2011-04-21 DIAGNOSIS — C186 Malignant neoplasm of descending colon: Secondary | ICD-10-CM

## 2011-04-21 DIAGNOSIS — Z23 Encounter for immunization: Secondary | ICD-10-CM

## 2011-04-21 LAB — CBC WITH DIFFERENTIAL/PLATELET
BASO%: 1.3 % (ref 0.0–2.0)
EOS%: 6.1 % (ref 0.0–7.0)
HCT: 37.7 % (ref 34.8–46.6)
LYMPH%: 21.3 % (ref 14.0–49.7)
MCH: 32 pg (ref 25.1–34.0)
MCHC: 35.1 g/dL (ref 31.5–36.0)
MCV: 91.2 fL (ref 79.5–101.0)
MONO%: 11.6 % (ref 0.0–14.0)
NEUT%: 59.7 % (ref 38.4–76.8)
Platelets: 101 10*3/uL — ABNORMAL LOW (ref 145–400)

## 2011-04-23 ENCOUNTER — Encounter: Payer: Self-pay | Admitting: Obstetrics and Gynecology

## 2011-04-26 ENCOUNTER — Encounter: Payer: Self-pay | Admitting: Cardiology

## 2011-04-26 ENCOUNTER — Encounter (HOSPITAL_BASED_OUTPATIENT_CLINIC_OR_DEPARTMENT_OTHER): Payer: Medicare Other | Admitting: Hematology and Oncology

## 2011-04-26 DIAGNOSIS — C186 Malignant neoplasm of descending colon: Secondary | ICD-10-CM

## 2011-04-26 DIAGNOSIS — D509 Iron deficiency anemia, unspecified: Secondary | ICD-10-CM

## 2011-06-29 ENCOUNTER — Other Ambulatory Visit: Payer: Medicare Other | Admitting: *Deleted

## 2011-07-06 ENCOUNTER — Ambulatory Visit: Payer: Medicare Other | Admitting: Cardiology

## 2011-07-08 LAB — POCT HEMOGLOBIN-HEMACUE
Hemoglobin: 14.4
Operator id: 123881

## 2011-07-08 LAB — BASIC METABOLIC PANEL
BUN: 11
Chloride: 98
GFR calc Af Amer: >60
GFR calc non Af Amer: >60
Potassium: 4.3
Sodium: 137

## 2011-07-26 ENCOUNTER — Ambulatory Visit (INDEPENDENT_AMBULATORY_CARE_PROVIDER_SITE_OTHER): Payer: Medicare Other | Admitting: *Deleted

## 2011-07-26 ENCOUNTER — Other Ambulatory Visit: Payer: Self-pay | Admitting: *Deleted

## 2011-07-26 DIAGNOSIS — E119 Type 2 diabetes mellitus without complications: Secondary | ICD-10-CM

## 2011-07-26 DIAGNOSIS — E7800 Pure hypercholesterolemia, unspecified: Secondary | ICD-10-CM

## 2011-07-26 DIAGNOSIS — I10 Essential (primary) hypertension: Secondary | ICD-10-CM

## 2011-07-26 DIAGNOSIS — C189 Malignant neoplasm of colon, unspecified: Secondary | ICD-10-CM

## 2011-07-26 LAB — HEMOGLOBIN A1C: Hgb A1c MFr Bld: 6.5 % (ref 4.6–6.5)

## 2011-07-26 LAB — HEPATIC FUNCTION PANEL
ALT: 37 U/L — ABNORMAL HIGH (ref 0–35)
Albumin: 4.2 g/dL (ref 3.5–5.2)
Bilirubin, Direct: 0.3 mg/dL (ref 0.0–0.3)
Total Protein: 7.2 g/dL (ref 6.0–8.3)

## 2011-07-26 LAB — LIPID PANEL
Cholesterol: 169 mg/dL (ref 0–200)
Triglycerides: 82 mg/dL (ref 0.0–149.0)
VLDL: 16.4 mg/dL (ref 0.0–40.0)

## 2011-07-26 LAB — BASIC METABOLIC PANEL
BUN: 14 mg/dL (ref 6–23)
Creatinine, Ser: 0.6 mg/dL (ref 0.4–1.2)
GFR: 110.79 mL/min (ref >60.00)
Potassium: 4.3 mEq/L (ref 3.5–5.1)

## 2011-07-30 ENCOUNTER — Ambulatory Visit (INDEPENDENT_AMBULATORY_CARE_PROVIDER_SITE_OTHER): Payer: Medicare Other | Admitting: Cardiology

## 2011-07-30 ENCOUNTER — Encounter: Payer: Self-pay | Admitting: Cardiology

## 2011-07-30 VITALS — BP 142/82 | HR 64 | Ht 61.0 in | Wt 157.0 lb

## 2011-07-30 DIAGNOSIS — I119 Hypertensive heart disease without heart failure: Secondary | ICD-10-CM | POA: Insufficient documentation

## 2011-07-30 DIAGNOSIS — E119 Type 2 diabetes mellitus without complications: Secondary | ICD-10-CM | POA: Insufficient documentation

## 2011-07-30 DIAGNOSIS — E7800 Pure hypercholesterolemia, unspecified: Secondary | ICD-10-CM | POA: Insufficient documentation

## 2011-07-30 NOTE — Patient Instructions (Signed)
Your physician recommends that you continue on your current medications as directed. Please refer to the Current Medication list given to you today.  Your physician wants you to follow-up in: 6 months with fasting labs (lp/bmet/hfp/A1C) You will receive a reminder letter in the mail two months in advance. If you don't receive a letter, please call our office to schedule the follow-up appointment.  

## 2011-07-30 NOTE — Progress Notes (Signed)
Amber Crosby Date of Birth:  07/25/1946 [facility] / [facility] [address]   [address] [place], [place]  [address] [phone]           Fax   [phone]  History of Present Illness: This pleasant 65 year old woman is seen for a scheduled 6 month followup office visit.  He has a past history of colon cancer without evidence of recurrence.  She also has a history of portal hypertension and esophageal varices possibly secondary to an adverse effect of previous chemotherapy.  In addition she has risk factors including essential hypertension, hypercholesterolemia, diabetes type 2.  Since last visit she has been doing a little better.  Her stress level has improved since her sister has moved back to [place].  The patient has not been experiencing any chest pain or palpitations or dyspnea  Current Outpatient Prescriptions  Medication Sig Dispense Refill  . ALPRAZolam (XANAX) 0.25 MG tablet Take 0.25 mg by mouth at bedtime as needed.        [name] amLODipine (NORVASC) 5 MG tablet TAKE 1 TABLET DAILY  90 tablet  3  . Cholecalciferol (VITAMIN D PO) Take by mouth daily.        . Coenzyme Q10 (CO Q 10 PO) Take by mouth daily.        . CRESTOR 5 MG tablet TAKE 1 TABLET DAILY  90 tablet  3  . losartan-hydrochlorothiazide (HYZAAR) 100-12.5 MG per tablet TAKE 1 TABLET DAILY  90 tablet  3  . metFORMIN (GLUCOPHAGE) 500 MG tablet Take 500 mg by mouth 2 (two) times daily with a meal.        . nadolol (CORGARD) 40 MG tablet Take 80 mg by mouth daily. Take 1 and 1/2 tablets daily      . ZETIA 10 MG tablet TAKE 1 TABLET DAILY  90 tablet  3    Allergies  Allergen Reactions  . Iohexol      Desc: pt had pre-meds and got hives.so in future will need IV prep in addition to regular prep and pepcid also!!!     Patient Active Problem List  Diagnoses  . Diabetes mellitus without mention of complication  . Benign hypertensive heart disease without heart failure  . Pure hypercholesterolemia     History  Smoking status  . Unknown If Ever Smoked  Smokeless tobacco  . Not on file    History  Alcohol Use     Family History  Problem Relation Age of Onset  . Hypertension Mother   . Cancer Mother   . Diabetes Mother     Review of Systems: Constitutional: no fever chills diaphoresis or fatigue or change in weight.  Head and neck: no hearing loss, no epistaxis, no photophobia or visual disturbance. Respiratory: No cough, shortness of breath or wheezing. Cardiovascular: No chest pain peripheral edema, palpitations. Gastrointestinal: No abdominal distention, no abdominal pain, no change in bowel habits hematochezia or melena. Genitourinary: No dysuria, no frequency, no urgency, no nocturia. Musculoskeletal:No arthralgias, no back pain, no gait disturbance or myalgias. Neurological: No dizziness, no headaches, no numbness, no seizures, no syncope, no weakness, no tremors. Hematologic: No lymphadenopathy, no easy bruising. Psychiatric: No confusion, no hallucinations, no sleep disturbance.    Physical Exam: Filed Vitals:   07/30/11 1145  BP: 142/82  Pulse: 64   The patient appears to be in no distress.  Head and neck exam reveals that the pupils are equal and reactive.  The extraocular movements are full.  There is no scleral icterus.  Mouth and pharynx are benign.  No lymphadenopathy.  No carotid bruits.  The jugular venous pressure is normal.  Thyroid is not enlarged or tender.  Chest is clear to percussion and auscultation.  No rales or rhonchi.  Expansion of the chest is symmetrical.  Heart reveals no abnormal lift or heave.  First and second heart sounds are normal.  There is no murmur gallop rub or click.  The abdomen is soft and nontender.  Bowel sounds are normoactive.  There is no hepatosplenomegaly or mass.  There are no abdominal bruits.  Extremities reveal no phlebitis or edema.  Pedal pulses are good.  There is no cyanosis or clubbing.  Neurologic  exam is normal strength and no lateralizing weakness.  No sensory deficits.  Integument reveals no rash   Assessment / Plan:  Continue same medication.  Continue careful diet.  Recheck in 6 months for office visit and extensive lab work including hemoglobin A1c

## 2011-07-30 NOTE — Assessment & Plan Note (Signed)
Patient has not been having any recent symptoms from her hypertension.  She is tolerating her amlodipine without any peripheral edema

## 2011-07-30 NOTE — Assessment & Plan Note (Signed)
The patient remains on Crestor.  She's not having any side effects.  Her lipids show improvement

## 2011-07-30 NOTE — Assessment & Plan Note (Signed)
The patient is diabetic.  She is trying to watch her diet.  Her blood sugars are improving and she has not had any hypoglycemic episodes.

## 2011-08-20 ENCOUNTER — Telehealth: Payer: Self-pay | Admitting: *Deleted

## 2011-08-20 ENCOUNTER — Other Ambulatory Visit: Payer: Medicare Other | Admitting: Lab

## 2011-08-20 NOTE — Telephone Encounter (Signed)
Received call from pt on 08/18/11  Wanting to inform nurse re:   Pt had called and cancelled CT scans appt for 12/3 ;   Pt also would like to cancel f/u appt with md on 08/25/11.    Pt stated she has too much going on now;  Pt had discussed with md at her last visit about doing scans if pt would like.   Pt stated she would call office back after the holidays to reschedule scans and f/u visit. Pt stated she was feeling fine,  Had f/u with Dr. Ewing Schlein and her primary  -  All labs were fine.

## 2011-08-21 ENCOUNTER — Encounter: Payer: Self-pay | Admitting: *Deleted

## 2011-08-23 ENCOUNTER — Other Ambulatory Visit: Payer: 59

## 2011-08-25 ENCOUNTER — Ambulatory Visit: Payer: Medicare Other | Admitting: Hematology and Oncology

## 2011-09-11 ENCOUNTER — Telehealth: Payer: Self-pay | Admitting: Nurse Practitioner

## 2011-09-11 NOTE — Telephone Encounter (Signed)
Pt is primary care pt of dr. Patty Sermons.  She has family in town for the holidays and 2 of the children tested positive for flu and currently being treated.  She wishes to obtain a Rx for prophylactic Tamiflu.  I called in a Rx for Tamiflu 75mg  1 tab po daily x 10 days, #10, zero refills to [facility] @ [phone].

## 2011-09-23 DIAGNOSIS — K921 Melena: Secondary | ICD-10-CM | POA: Diagnosis not present

## 2011-09-23 DIAGNOSIS — I8510 Secondary esophageal varices without bleeding: Secondary | ICD-10-CM | POA: Diagnosis not present

## 2011-09-23 DIAGNOSIS — D509 Iron deficiency anemia, unspecified: Secondary | ICD-10-CM | POA: Diagnosis not present

## 2011-09-23 DIAGNOSIS — Z8500 Personal history of malignant neoplasm of unspecified digestive organ: Secondary | ICD-10-CM | POA: Diagnosis not present

## 2011-09-30 ENCOUNTER — Telehealth: Payer: Self-pay | Admitting: *Deleted

## 2011-09-30 NOTE — Telephone Encounter (Signed)
Pt. Left vm reporting that she has been out of town for few days & Dr. Lonell Face nurse, Thu had called re: labwork & she is returning the call.  Per  Thu, Dr. Dalene Carrow wanted pt to know that her plts were 93 so that she would be aware.  This was relayed to pt.  She reports that her sister in [place] died & if Dr.Odogwu thinks she needs to have a CT or be soon to contact her.  She is not having any bleeding or bruising but will be aware of this.

## 2011-10-01 ENCOUNTER — Other Ambulatory Visit: Payer: Self-pay | Admitting: *Deleted

## 2011-10-04 ENCOUNTER — Telehealth: Payer: Self-pay | Admitting: Hematology and Oncology

## 2011-10-04 ENCOUNTER — Other Ambulatory Visit: Payer: Self-pay | Admitting: *Deleted

## 2011-10-04 DIAGNOSIS — C186 Malignant neoplasm of descending colon: Secondary | ICD-10-CM

## 2011-10-04 NOTE — Telephone Encounter (Signed)
S/w pt re appt for 1/21.

## 2011-10-11 ENCOUNTER — Other Ambulatory Visit: Payer: Medicare Other | Admitting: Lab

## 2011-10-11 DIAGNOSIS — C186 Malignant neoplasm of descending colon: Secondary | ICD-10-CM | POA: Diagnosis not present

## 2011-10-11 LAB — CBC WITH DIFFERENTIAL/PLATELET
BASO%: 0.9 % (ref 0.0–2.0)
Basophils Absolute: 0 10*3/uL (ref 0.0–0.1)
HCT: 38.5 % (ref 34.8–46.6)
HGB: 13.4 g/dL (ref 11.6–15.9)
LYMPH%: 19.8 % (ref 14.0–49.7)
MCHC: 34.9 g/dL (ref 31.5–36.0)
MONO#: 0.5 10*3/uL (ref 0.1–0.9)
NEUT%: 65.5 % (ref 38.4–76.8)
Platelets: 115 10*3/uL — ABNORMAL LOW (ref 145–400)
WBC: 4.8 10*3/uL (ref 3.9–10.3)
lymph#: 0.9 10*3/uL (ref 0.9–3.3)

## 2011-10-11 LAB — FERRITIN: Ferritin: 229 ng/mL (ref 10–291)

## 2011-10-12 ENCOUNTER — Telehealth: Payer: Self-pay | Admitting: *Deleted

## 2011-10-12 NOTE — Telephone Encounter (Signed)
Called pt at home and left message on voice mail re:  Plts were much better from labs done 10/11/11 ;   Ferritin level also was great as per md's instructions.

## 2011-10-14 ENCOUNTER — Other Ambulatory Visit: Payer: Self-pay | Admitting: Cardiology

## 2011-11-04 ENCOUNTER — Other Ambulatory Visit: Payer: Self-pay | Admitting: Cardiology

## 2011-11-04 DIAGNOSIS — E7800 Pure hypercholesterolemia, unspecified: Secondary | ICD-10-CM

## 2011-11-04 MED ORDER — ZETIA 10 MG PO TABS: 10.0000 mg | ORAL_TABLET | Freq: Every day | ORAL | Status: DC

## 2011-11-04 MED ORDER — CRESTOR 5 MG PO TABS: 5.0000 mg | ORAL_TABLET | Freq: Every day | ORAL | Status: DC

## 2011-11-04 MED ORDER — NADOLOL 80 MG PO TABS: 80.0000 mg | ORAL_TABLET | Freq: Every day | ORAL | Status: DC

## 2011-11-04 NOTE — Telephone Encounter (Signed)
Refilled zetia,crestor and corgard  To [facility]

## 2011-11-04 NOTE — Telephone Encounter (Signed)
New Msg: pt stated she needed 90 day supply of crestor, zetia, and nadolol called into [facility].  [facility] Fax # per pt: [phone]

## 2012-01-11 DIAGNOSIS — I8510 Secondary esophageal varices without bleeding: Secondary | ICD-10-CM | POA: Diagnosis not present

## 2012-01-11 DIAGNOSIS — Z85038 Personal history of other malignant neoplasm of large intestine: Secondary | ICD-10-CM | POA: Diagnosis not present

## 2012-01-11 DIAGNOSIS — D509 Iron deficiency anemia, unspecified: Secondary | ICD-10-CM | POA: Diagnosis not present

## 2012-01-11 DIAGNOSIS — K921 Melena: Secondary | ICD-10-CM | POA: Diagnosis not present

## 2012-01-21 ENCOUNTER — Ambulatory Visit (INDEPENDENT_AMBULATORY_CARE_PROVIDER_SITE_OTHER): Payer: Medicare Other | Admitting: *Deleted

## 2012-01-21 DIAGNOSIS — E119 Type 2 diabetes mellitus without complications: Secondary | ICD-10-CM

## 2012-01-21 DIAGNOSIS — I119 Hypertensive heart disease without heart failure: Secondary | ICD-10-CM

## 2012-01-21 DIAGNOSIS — E7800 Pure hypercholesterolemia, unspecified: Secondary | ICD-10-CM | POA: Diagnosis not present

## 2012-01-21 LAB — BASIC METABOLIC PANEL
BUN: 15 mg/dL (ref 6–23)
CO2: 28 mEq/L (ref 19–32)
Chloride: 100 mEq/L (ref 96–112)
Creatinine, Ser: 0.6 mg/dL (ref 0.4–1.2)

## 2012-01-21 LAB — CBC WITH DIFFERENTIAL/PLATELET
Basophils Relative: 0.7 % (ref 0.0–3.0)
Eosinophils Absolute: 0.2 10*3/uL (ref 0.0–0.7)
MCHC: 34.6 g/dL (ref 30.0–36.0)
MCV: 93.9 fl (ref 78.0–100.0)
Monocytes Absolute: 0.5 10*3/uL (ref 0.1–1.0)
Neutrophils Relative %: 77.1 % — ABNORMAL HIGH (ref 43.0–77.0)
Platelets: 92 10*3/uL — ABNORMAL LOW (ref 150.0–400.0)
RDW: 13.7 % (ref 11.5–14.6)

## 2012-01-21 LAB — LIPID PANEL
Cholesterol: 144 mg/dL (ref 0–200)
LDL Cholesterol: 76 mg/dL (ref 0–99)
Total CHOL/HDL Ratio: 3

## 2012-01-21 LAB — HEPATIC FUNCTION PANEL
Alkaline Phosphatase: 82 U/L (ref 39–117)
Bilirubin, Direct: 0.3 mg/dL (ref 0.0–0.3)

## 2012-01-23 NOTE — Progress Notes (Signed)
Quick Note:  Please make copy of labs for patient visit. ______ 

## 2012-01-26 ENCOUNTER — Ambulatory Visit (INDEPENDENT_AMBULATORY_CARE_PROVIDER_SITE_OTHER): Payer: Medicare Other | Admitting: Cardiology

## 2012-01-26 ENCOUNTER — Encounter: Payer: Self-pay | Admitting: Cardiology

## 2012-01-26 VITALS — BP 128/80 | HR 66 | Ht 61.0 in | Wt 149.0 lb

## 2012-01-26 DIAGNOSIS — R110 Nausea: Secondary | ICD-10-CM

## 2012-01-26 DIAGNOSIS — E119 Type 2 diabetes mellitus without complications: Secondary | ICD-10-CM | POA: Diagnosis not present

## 2012-01-26 DIAGNOSIS — E7800 Pure hypercholesterolemia, unspecified: Secondary | ICD-10-CM

## 2012-01-26 DIAGNOSIS — I119 Hypertensive heart disease without heart failure: Secondary | ICD-10-CM | POA: Diagnosis not present

## 2012-01-26 MED ORDER — ONDANSETRON 4 MG PO TBDP: 4.0000 mg | ORAL_TABLET | Freq: Four times a day (QID) | ORAL | Status: DC | PRN

## 2012-01-26 MED ORDER — METFORMIN HCL 500 MG PO TABS: ORAL_TABLET | ORAL | Status: DC

## 2012-01-26 NOTE — Progress Notes (Signed)
Joaquin Courts Date of Birth:  1945-10-09 [facility] [address] [place], [place]  [address] [phone]         Fax   [phone]  History of Present Illness: This pleasant 66 year old woman is seen for a scheduled followup office visit.[name] she has a past history of hypertension and diabetes and hypercholesterolemia.  She also has a past history of esophageal varices and a past history of colon cancer.  She is a problem with chronic mild anemia and also a problem with thrombocytopenia.  Since last visit she has made a discovery concerning her diet.  She feels that she has gluten intolerance.  Since she has switched to a low gluten diet her symptoms have improved markedly in terms of bloating and postprandial diarrhea  Current Outpatient Prescriptions  Medication Sig Dispense Refill  . ALPRAZolam (XANAX) 0.25 MG tablet Take 0.25 mg by mouth at bedtime as needed.        [name] amLODipine (NORVASC) 5 MG tablet TAKE 1 TABLET DAILY  90 tablet  3  . Cholecalciferol (VITAMIN D PO) Take by mouth daily.        . Coenzyme Q10 (CO Q 10 PO) Take by mouth daily.        . CRESTOR 5 MG tablet Take 1 tablet (5 mg total) by mouth daily.  90 tablet  3  . losartan-hydrochlorothiazide (HYZAAR) 100-12.5 MG per tablet TAKE 1 TABLET DAILY  90 tablet  3  . metFORMIN (GLUCOPHAGE) 500 MG tablet 2 twice daily  360 tablet  3  . nadolol (CORGARD) 80 MG tablet Take 1 tablet (80 mg total) by mouth daily.  90 tablet  3  . ZETIA 10 MG tablet Take 1 tablet (10 mg total) by mouth daily.  90 tablet  3  . DISCONTD: metFORMIN (GLUCOPHAGE) 500 MG tablet Take 500 mg by mouth. 2 twice daily      . ondansetron (ZOFRAN ODT) 4 MG disintegrating tablet Take 1 tablet (4 mg total) by mouth every 6 (six) hours as needed for nausea.  30 tablet  2  . DISCONTD: nadolol (CORGARD) 40 MG tablet Take 80 mg by mouth daily.         Allergies  Allergen Reactions  . Meperidine And Related Nausea And Vomiting  . Contrast  Media (Iodinated Diagnostic Agents)   . Iohexol      Desc: pt had pre-meds and got hives.so in future will need IV prep in addition to regular prep and pepcid also!!!   . Morphine And Related     Patient Active Problem List  Diagnoses  . Diabetes mellitus without mention of complication  . Benign hypertensive heart disease without heart failure  . Pure hypercholesterolemia    History  Smoking status  . Never Smoker   Smokeless tobacco  . Not on file    History  Alcohol Use: Not on file    Family History  Problem Relation Age of Onset  . Hypertension Mother   . Cancer Mother   . Diabetes Mother     Review of Systems: Constitutional: no fever chills diaphoresis or fatigue or change in weight.  Head and neck: no hearing loss, no epistaxis, no photophobia or visual disturbance. Respiratory: No cough, shortness of breath or wheezing. Cardiovascular: No chest pain peripheral edema, palpitations. Gastrointestinal: No abdominal distention, no abdominal pain, no change in bowel habits hematochezia or melena. Genitourinary: No dysuria, no frequency, no urgency, no nocturia. Musculoskeletal:No arthralgias, no back pain, no  gait disturbance or myalgias. Neurological: No dizziness, no headaches, no numbness, no seizures, no syncope, no weakness, no tremors. Hematologic: No lymphadenopathy, no easy bruising. Psychiatric: No confusion, no hallucinations, no sleep disturbance.    Physical Exam: Filed Vitals:   01/26/12 0856  BP: 128/80  Pulse: 66   the general appearance reveals a well-developed well-nourished woman in no distress.The head and neck exam reveals pupils equal and reactive.  Extraocular movements are full.  There is no scleral icterus.  The mouth and pharynx are normal.  The neck is supple.  The carotids reveal no bruits.  The jugular venous pressure is normal.  The  thyroid is not enlarged.  There is no lymphadenopathy.  The chest is clear to percussion and  auscultation.  There are no rales or rhonchi.  Expansion of the chest is symmetrical.  The precordium is quiet.  The first heart sound is normal.  The second heart sound is physiologically split.  There is no murmur gallop rub or click.  There is no abnormal lift or heave.  The abdomen is soft and nontender.  The bowel sounds are normal.  The liver and spleen are not enlarged.  There are no abdominal masses.  There are no abdominal bruits.  Extremities reveal good pedal pulses.  There is no phlebitis or edema.  There is no cyanosis or clubbing.  Strength is normal and symmetrical in all extremities.  There is no lateralizing weakness.  There are no sensory deficits.  The skin is warm and dry.  There is no rash.     Assessment / Plan: Patient is to continue same medication.  Recheck here in 6 months for followup office visit EKG CBC and lipid panel hepatic function panel basal metabolic panel and hemoglobin Z6X.  Her platelet count is lower and we're faxing those results to Dr. Ewing Schlein and to her oncologist

## 2012-01-26 NOTE — Assessment & Plan Note (Signed)
The patient is not having any hypoglycemic episodes.  Her blood sugars are running high and we are going to increase her metformin back to 1000 mg twice a day

## 2012-01-26 NOTE — Assessment & Plan Note (Signed)
She has history of hypercholesterolemia and is on ezetimibe and a low-dose Crestor.  Her lipids are satisfactory on current regimen

## 2012-01-26 NOTE — Assessment & Plan Note (Signed)
The patient has not been expressing any chest pain or shortness of breath.  She has been trying to walk for exercise.  Her weight is down 8 pounds since last visit

## 2012-01-26 NOTE — Patient Instructions (Signed)
Increase Metformin to 2 twice a day  Your physician wants you to follow-up in: 6 months You will receive a reminder letter in the mail two months in advance. If you don't receive a letter, please call our office to schedule the follow-up appointment.

## 2012-01-27 ENCOUNTER — Telehealth: Payer: Self-pay | Admitting: Cardiology

## 2012-01-27 DIAGNOSIS — I119 Hypertensive heart disease without heart failure: Secondary | ICD-10-CM

## 2012-01-27 MED ORDER — LOSARTAN POTASSIUM-HCTZ 100-12.5 MG PO TABS: 1.0000 | ORAL_TABLET | Freq: Every day | ORAL | Status: DC

## 2012-01-27 MED ORDER — METFORMIN HCL 500 MG PO TABS: ORAL_TABLET | ORAL | Status: DC

## 2012-01-27 MED ORDER — AMLODIPINE BESYLATE 5 MG PO TABS: 5.0000 mg | ORAL_TABLET | Freq: Every day | ORAL | Status: DC

## 2012-01-27 NOTE — Telephone Encounter (Signed)
Pt calls back with fax number for [facility] [phone] . I will print prescriptions today and fax for pt.  She has been on phone with them all morning. Mylo Red RN

## 2012-01-27 NOTE — Telephone Encounter (Signed)
PT CALLING BACK [phone] RE MEDICATION/ [facility]

## 2012-01-27 NOTE — Telephone Encounter (Signed)
Addended by: Lisabeth Devoid F on: 01/27/2012 10:45 AM   Modules accepted: Orders

## 2012-01-27 NOTE — Telephone Encounter (Signed)
Addended by: Lisabeth Devoid F on: 01/27/2012 10:44 AM   Modules accepted: Orders

## 2012-01-28 ENCOUNTER — Other Ambulatory Visit: Payer: Self-pay

## 2012-01-28 DIAGNOSIS — R110 Nausea: Secondary | ICD-10-CM

## 2012-01-28 MED ORDER — ONDANSETRON 4 MG PO TBDP: 4.0000 mg | ORAL_TABLET | Freq: Four times a day (QID) | ORAL | Status: AC | PRN

## 2012-02-25 ENCOUNTER — Other Ambulatory Visit (INDEPENDENT_AMBULATORY_CARE_PROVIDER_SITE_OTHER): Payer: Medicare Other

## 2012-02-25 ENCOUNTER — Telehealth: Payer: Self-pay | Admitting: *Deleted

## 2012-02-25 ENCOUNTER — Telehealth: Payer: Self-pay | Admitting: Cardiology

## 2012-02-25 DIAGNOSIS — K921 Melena: Secondary | ICD-10-CM

## 2012-02-25 LAB — CBC WITH DIFFERENTIAL/PLATELET
Eosinophils Absolute: 0.3 10*3/uL (ref 0.0–0.7)
Eosinophils Relative: 3.3 % (ref 0.0–5.0)
HCT: 35.7 % — ABNORMAL LOW (ref 36.0–46.0)
Lymphs Abs: 1.2 10*3/uL (ref 0.7–4.0)
MCHC: 33.2 g/dL (ref 30.0–36.0)
MCV: 92.7 fl (ref 78.0–100.0)
Monocytes Absolute: 0.6 10*3/uL (ref 0.1–1.0)
Neutrophils Relative %: 75.7 % (ref 43.0–77.0)
Platelets: 179 10*3/uL (ref 150.0–400.0)
RDW: 13.1 % (ref 11.5–14.6)

## 2012-02-25 NOTE — Telephone Encounter (Signed)
Advised patient

## 2012-02-25 NOTE — Telephone Encounter (Signed)
Has been having black stools and Dr Ewing Schlein out of town.  His office recommended she call here to see if she could get CBC.  Patient going to [facility] lab to have drawn now. Ok per  Dr. Patty Sermons

## 2012-02-25 NOTE — Telephone Encounter (Signed)
Message copied by Burnell Blanks on Fri Feb 25, 2012  5:37 PM ------      Message from: Cassell Clement      Created: Fri Feb 25, 2012  5:22 PM       Please report.  Hemoglobin is only mildly down at 11.9.  She should followup with Dr. Ewing Schlein in another week or so.

## 2012-02-25 NOTE — Telephone Encounter (Signed)
Please return call to patient at [phone] regarding CBC labs.

## 2012-03-10 DIAGNOSIS — K2980 Duodenitis without bleeding: Secondary | ICD-10-CM | POA: Diagnosis not present

## 2012-03-10 DIAGNOSIS — K766 Portal hypertension: Secondary | ICD-10-CM | POA: Diagnosis not present

## 2012-03-10 DIAGNOSIS — I8500 Esophageal varices without bleeding: Secondary | ICD-10-CM | POA: Diagnosis not present

## 2012-03-10 DIAGNOSIS — K319 Disease of stomach and duodenum, unspecified: Secondary | ICD-10-CM | POA: Diagnosis not present

## 2012-03-10 DIAGNOSIS — K921 Melena: Secondary | ICD-10-CM | POA: Diagnosis not present

## 2012-03-10 DIAGNOSIS — D509 Iron deficiency anemia, unspecified: Secondary | ICD-10-CM | POA: Diagnosis not present

## 2012-03-22 ENCOUNTER — Telehealth: Payer: Self-pay | Admitting: Hematology and Oncology

## 2012-03-22 ENCOUNTER — Other Ambulatory Visit: Payer: Self-pay | Admitting: *Deleted

## 2012-03-22 ENCOUNTER — Telehealth: Payer: Self-pay | Admitting: *Deleted

## 2012-03-22 DIAGNOSIS — C186 Malignant neoplasm of descending colon: Secondary | ICD-10-CM

## 2012-03-22 NOTE — Telephone Encounter (Signed)
S/w pt re appts for lb/ct 7/5 and LO 7/9.

## 2012-03-22 NOTE — Telephone Encounter (Signed)
Pt lmonvm requesting appt. Message to desk nurse. Returned call and made pt aware she would be contacted w/appt when receive orders from LO.

## 2012-03-22 NOTE — Telephone Encounter (Signed)
Received message from [facility], scheduler that pt requested a f/u appt with md.   Spoke with pt and was informed that pt would like to schedule appt with md,  Labs, and CT scans if needed.   Pt stated she has appt with Dr. Ewing Schlein on 03/27/12 for discussion of possible shunting surgery for rectal bleeding caused by worsening of ulcers in stomach.   Pt had EDG this past Fri 03/17/12 by Dr. Ewing Schlein.   Pt wanted to touch base with Dr. Dalene Carrow  To discuss further about her problems.  Message to md.

## 2012-03-24 ENCOUNTER — Encounter: Payer: Self-pay | Admitting: *Deleted

## 2012-03-24 ENCOUNTER — Other Ambulatory Visit (HOSPITAL_BASED_OUTPATIENT_CLINIC_OR_DEPARTMENT_OTHER): Payer: Medicare Other | Admitting: Lab

## 2012-03-24 ENCOUNTER — Other Ambulatory Visit: Payer: Self-pay | Admitting: Hematology and Oncology

## 2012-03-24 ENCOUNTER — Ambulatory Visit (HOSPITAL_COMMUNITY)
Admission: RE | Admit: 2012-03-24 | Discharge: 2012-03-24 | Disposition: A | Discharge status: Home / Self Care | Payer: Medicare Other | Source: Ambulatory Visit | Attending: Hematology and Oncology | Admitting: Hematology and Oncology

## 2012-03-24 ENCOUNTER — Encounter (HOSPITAL_COMMUNITY): Payer: Self-pay

## 2012-03-24 DIAGNOSIS — K7689 Other specified diseases of liver: Secondary | ICD-10-CM | POA: Insufficient documentation

## 2012-03-24 DIAGNOSIS — C186 Malignant neoplasm of descending colon: Secondary | ICD-10-CM | POA: Diagnosis not present

## 2012-03-24 DIAGNOSIS — Z90710 Acquired absence of both cervix and uterus: Secondary | ICD-10-CM | POA: Insufficient documentation

## 2012-03-24 DIAGNOSIS — K449 Diaphragmatic hernia without obstruction or gangrene: Secondary | ICD-10-CM | POA: Diagnosis not present

## 2012-03-24 DIAGNOSIS — R911 Solitary pulmonary nodule: Secondary | ICD-10-CM | POA: Insufficient documentation

## 2012-03-24 DIAGNOSIS — R161 Splenomegaly, not elsewhere classified: Secondary | ICD-10-CM | POA: Insufficient documentation

## 2012-03-24 DIAGNOSIS — R188 Other ascites: Secondary | ICD-10-CM | POA: Insufficient documentation

## 2012-03-24 DIAGNOSIS — I2510 Atherosclerotic heart disease of native coronary artery without angina pectoris: Secondary | ICD-10-CM | POA: Insufficient documentation

## 2012-03-24 LAB — CBC WITH DIFFERENTIAL/PLATELET
Basophils Absolute: 0.1 10*3/uL (ref 0.0–0.1)
EOS%: 5.7 % (ref 0.0–7.0)
HCT: 33.5 % — ABNORMAL LOW (ref 34.8–46.6)
HGB: 11.4 g/dL — ABNORMAL LOW (ref 11.6–15.9)
LYMPH%: 18.4 % (ref 14.0–49.7)
MCH: 30.9 pg (ref 25.1–34.0)
MCV: 91.3 fL (ref 79.5–101.0)
MONO%: 10.7 % (ref 0.0–14.0)
NEUT%: 63.8 % (ref 38.4–76.8)
Platelets: 125 10*3/uL — ABNORMAL LOW (ref 145–400)
RDW: 14.1 % (ref 11.2–14.5)

## 2012-03-24 LAB — CMP (CANCER CENTER ONLY)
ALT(SGPT): 35 U/L (ref 10–47)
AST: 37 U/L (ref 11–38)
BUN, Bld: 8 mg/dL (ref 7–22)
CO2: 29 mEq/L (ref 18–33)
Creat: 0.5 mg/dl — ABNORMAL LOW (ref 0.6–1.2)
Total Bilirubin: 1.5 mg/dl (ref 0.20–1.60)

## 2012-03-24 LAB — CEA: CEA: 2 ng/mL (ref 0.0–5.0)

## 2012-03-27 ENCOUNTER — Other Ambulatory Visit (HOSPITAL_COMMUNITY): Payer: Medicare Other

## 2012-03-27 ENCOUNTER — Other Ambulatory Visit: Payer: Medicare Other

## 2012-03-27 DIAGNOSIS — K921 Melena: Secondary | ICD-10-CM | POA: Diagnosis not present

## 2012-03-27 DIAGNOSIS — I8510 Secondary esophageal varices without bleeding: Secondary | ICD-10-CM | POA: Diagnosis not present

## 2012-03-27 DIAGNOSIS — D509 Iron deficiency anemia, unspecified: Secondary | ICD-10-CM | POA: Diagnosis not present

## 2012-03-28 ENCOUNTER — Ambulatory Visit (HOSPITAL_BASED_OUTPATIENT_CLINIC_OR_DEPARTMENT_OTHER): Payer: Medicare Other | Admitting: Hematology and Oncology

## 2012-03-28 ENCOUNTER — Encounter: Payer: Self-pay | Admitting: Hematology and Oncology

## 2012-03-28 VITALS — BP 139/73 | HR 64 | Temp 97.8°F | Ht 61.0 in | Wt 147.0 lb

## 2012-03-28 DIAGNOSIS — I8510 Secondary esophageal varices without bleeding: Secondary | ICD-10-CM | POA: Diagnosis not present

## 2012-03-28 DIAGNOSIS — D509 Iron deficiency anemia, unspecified: Secondary | ICD-10-CM

## 2012-03-28 DIAGNOSIS — K7460 Unspecified cirrhosis of liver: Secondary | ICD-10-CM

## 2012-03-28 DIAGNOSIS — C187 Malignant neoplasm of sigmoid colon: Secondary | ICD-10-CM

## 2012-03-28 DIAGNOSIS — C189 Malignant neoplasm of colon, unspecified: Secondary | ICD-10-CM | POA: Insufficient documentation

## 2012-03-28 DIAGNOSIS — D539 Nutritional anemia, unspecified: Secondary | ICD-10-CM | POA: Insufficient documentation

## 2012-03-28 NOTE — Patient Instructions (Signed)
CELESTER MORGAN  [id number]  [facility] Discharge Instructions  RECOMMENDATIONS MADE BY THE CONSULTANT AND ANY TEST RESULTS WILL BE SENT TO YOUR REFERRING DOCTOR.   EXAM FINDINGS BY MD TODAY AND SIGNS AND SYMPTOMS TO REPORT TO CLINIC OR PRIMARY MD:   Your current list of medications are: Current Outpatient Prescriptions  Medication Sig Dispense Refill  . ALPRAZolam (XANAX) 0.25 MG tablet Take 0.25 mg by mouth at bedtime as needed.        [name] amLODipine (NORVASC) 5 MG tablet Take 1 tablet (5 mg total) by mouth daily.  90 tablet  3  . Cholecalciferol (VITAMIN D PO) Take by mouth daily.        . Coenzyme Q10 (CO Q 10 PO) Take by mouth daily.        . CRESTOR 5 MG tablet Take 1 tablet (5 mg total) by mouth daily.  90 tablet  3  . losartan-hydrochlorothiazide (HYZAAR) 100-12.5 MG per tablet Take 1 tablet by mouth daily.  90 tablet  3  . metFORMIN (GLUCOPHAGE) 500 MG tablet 2 twice daily  360 tablet  3  . NADOLOL PO Take 120 mg by mouth daily.      [name] ZETIA 10 MG tablet Take 1 tablet (10 mg total) by mouth daily.  90 tablet  3     INSTRUCTIONS GIVEN AND DISCUSSED:   SPECIAL INSTRUCTIONS/FOLLOW-UP:  See above.  I acknowledge that I have been informed and understand all the instructions given to me and received a copy. I do not have any more questions at this time, but understand that I may call the [facility] at [phone] during business hours should I have any further questions or need assistance in obtaining follow-up care.

## 2012-03-28 NOTE — Progress Notes (Signed)
This office note has been dictated.

## 2012-03-28 NOTE — Progress Notes (Signed)
CC:   Amber Crosby, M.D. Amber Crosby, M.D.  IDENTIFYING STATEMENT:  The patient is a 66 year old woman with history of colon cancer and cirrhosis and iron-deficiency anemia who presents for followup.  INTERVAL HISTORY:  Amber Crosby is doing fairly well.  Reports 2 episodes of black tarry stools approximately 2 weeks ago.  She received an endoscopy with Dr. Ewing Schlein who reported stable esophageal varices but noted that the gastric varices were bigger.  He is recommending shunt surgery at tertiary center.  The patient is waiting to hear.  Otherwise is not short of breath or fatigued.  She had obtained CT scans on 03/24/2012 that essentially showed no evidence of metastatic disease. There is a moderate size hiatal hernia with irregular thickening of distal 3rd of the esophagus.  The liver did suggest cirrhosis with evidence of portal hypertension including splenomegaly and dilatation of portal vein and gastric distal esophageal varices.  The persistent thickened wall gallbladder suggestive of chronic cholecystitis.  MEDICATIONS:  Reviewed and updated.  ALLERGIES:  Meperidine, contrast media, morphine.  Past medical history, family history and social history unchanged.  REVIEW OF SYSTEMS:  10-point review of systems negative.  PHYSICAL EXAM:  The patient is a well-appearing, well-nourished woman in no distress.  Vitals:  Pulse 64, blood pressure 139/73, temperature 97.8, respirations 20, weight 147 pounds.  HEENT:  Head is atraumatic, normocephalic.  Sclerae anicteric.  Mouth moist.  Chest:  Clear.  CVS: Unremarkable.  Abdomen:  Soft, nontender.  Bowel sounds present. Extremities:  No edema.  LABORATORY DATA:  03/24/2012, white cell count 4.2, hemoglobin 11.4, hematocrit 33.5, platelets 125.  Sodium 139, potassium 5, chloride 95, CO2 29, BUN 8, creatinine. 0.5, total bilirubin 1.5, alkaline phosphatase 80, ALT 35, AST 37, iron 55, TIBC, saturation 17%, ferritin 76.  IMPRESSION  AND PLAN:  Amber Crosby is a 66 year old woman with: 1. History of stage III, T1 N1 M0 moderately-differentiated     adenocarcinoma of the ascending colon.  She is status post sigmoid     colectomy on Feb 12, 2005 with 2/10 positive lymph nodes.  Received     adjuvant FOLFOX 6 for 6 months from March 25, 2005 through September 08, 2005.  Her most updated CT scan showed no evidence of     reoccurrence. 2. Esophageal varices with liver cirrhosis and splenomegaly.  Managed     by Dr. Ewing Schlein and Dr. Julieta Gutting at [facility]. 3. Iron-deficiency anemia.  Has had 2 episodes of black tarry stools.     Will benefit from [facility].  The patient will proceed for infusion in a.m.  She will call for questions or concerns as she has close follow up with Dr. Patty Sermons. She follows up p.r.n.    ______________________________ Laurice Record, M.D. LIO/MEDQ  D:  03/28/2012  T:  03/28/2012  Job:  [id number]

## 2012-03-29 ENCOUNTER — Ambulatory Visit (HOSPITAL_BASED_OUTPATIENT_CLINIC_OR_DEPARTMENT_OTHER): Payer: Medicare Other

## 2012-03-29 VITALS — BP 125/82 | HR 65 | Temp 98.3°F

## 2012-03-29 DIAGNOSIS — D509 Iron deficiency anemia, unspecified: Secondary | ICD-10-CM | POA: Diagnosis not present

## 2012-03-29 DIAGNOSIS — D539 Nutritional anemia, unspecified: Secondary | ICD-10-CM

## 2012-03-29 MED ORDER — SODIUM CHLORIDE 0.9 % IV SOLN
1020.0000 mg | Freq: Once | INTRAVENOUS | Status: AC
  Administered 2012-03-29: 1020 mg via INTRAVENOUS
  Filled 2012-03-29: qty 34

## 2012-03-29 MED ORDER — SODIUM CHLORIDE 0.9 % IV SOLN
Freq: Once | INTRAVENOUS | Status: AC
  Administered 2012-03-29: 09:00:00 via INTRAVENOUS

## 2012-03-29 NOTE — Patient Instructions (Signed)
Iron Deficiency Anemia  Anemia is when you have a low number of healthy red blood cells. HOME CARE   Ask your doctor or dietician what foods you should eat.   Take iron and vitamins as told by your doctor.   Eat foods that have iron in them. This includes liver, lean beef, whole-grain bread, eggs, dried fruit, and dark green leafy vegetables.  GET HELP RIGHT AWAY IF:  You pass out (faint).   You have chest pain.   You feel sick to your stomach (nauseous) or throw up (vomit).   You get very short of breath with activity.   You are weak.   You are thirstier than normal.   You have a fast heartbeat.   You start to sweat or become lightheaded when getting up from a chair or bed.  MAKE SURE YOU:  Understand these instructions.   Will watch your condition.   Will get help right away if you are not doing well or get worse.  Document Released: 10/09/2010 Document Revised: 08/26/2011 Document Reviewed: 10/09/2010 [facility] Patient Information 2012 [place], [place].

## 2012-03-30 ENCOUNTER — Encounter: Payer: Self-pay | Admitting: Cardiology

## 2012-04-18 ENCOUNTER — Other Ambulatory Visit: Payer: Self-pay | Admitting: Cardiology

## 2012-04-18 ENCOUNTER — Other Ambulatory Visit: Payer: Self-pay | Admitting: *Deleted

## 2012-04-18 MED ORDER — LOSARTAN POTASSIUM-HCTZ 100-12.5 MG PO TABS: 1.0000 | ORAL_TABLET | Freq: Every day | ORAL | Status: DC

## 2012-04-28 DIAGNOSIS — D509 Iron deficiency anemia, unspecified: Secondary | ICD-10-CM | POA: Diagnosis not present

## 2012-04-28 DIAGNOSIS — I8510 Secondary esophageal varices without bleeding: Secondary | ICD-10-CM | POA: Diagnosis not present

## 2012-04-28 DIAGNOSIS — K921 Melena: Secondary | ICD-10-CM | POA: Diagnosis not present

## 2012-04-28 DIAGNOSIS — Z8500 Personal history of malignant neoplasm of unspecified digestive organ: Secondary | ICD-10-CM | POA: Diagnosis not present

## 2012-05-25 DIAGNOSIS — K766 Portal hypertension: Secondary | ICD-10-CM | POA: Diagnosis not present

## 2012-06-20 DIAGNOSIS — I868 Varicose veins of other specified sites: Secondary | ICD-10-CM | POA: Diagnosis not present

## 2012-06-20 DIAGNOSIS — I8500 Esophageal varices without bleeding: Secondary | ICD-10-CM | POA: Diagnosis not present

## 2012-06-20 DIAGNOSIS — K766 Portal hypertension: Secondary | ICD-10-CM | POA: Diagnosis not present

## 2012-06-20 DIAGNOSIS — K319 Disease of stomach and duodenum, unspecified: Secondary | ICD-10-CM | POA: Diagnosis not present

## 2012-07-11 DIAGNOSIS — K766 Portal hypertension: Secondary | ICD-10-CM | POA: Diagnosis not present

## 2012-07-11 DIAGNOSIS — Z79899 Other long term (current) drug therapy: Secondary | ICD-10-CM | POA: Diagnosis not present

## 2012-07-11 DIAGNOSIS — I8510 Secondary esophageal varices without bleeding: Secondary | ICD-10-CM | POA: Diagnosis not present

## 2012-07-11 DIAGNOSIS — I8500 Esophageal varices without bleeding: Secondary | ICD-10-CM | POA: Diagnosis not present

## 2012-07-11 DIAGNOSIS — K319 Disease of stomach and duodenum, unspecified: Secondary | ICD-10-CM | POA: Diagnosis not present

## 2012-07-11 DIAGNOSIS — I10 Essential (primary) hypertension: Secondary | ICD-10-CM | POA: Diagnosis not present

## 2012-07-11 DIAGNOSIS — E119 Type 2 diabetes mellitus without complications: Secondary | ICD-10-CM | POA: Diagnosis not present

## 2012-07-17 DIAGNOSIS — K766 Portal hypertension: Secondary | ICD-10-CM | POA: Diagnosis not present

## 2012-07-19 DIAGNOSIS — Z23 Encounter for immunization: Secondary | ICD-10-CM | POA: Diagnosis not present

## 2012-07-26 DIAGNOSIS — D509 Iron deficiency anemia, unspecified: Secondary | ICD-10-CM | POA: Diagnosis not present

## 2012-07-26 DIAGNOSIS — K921 Melena: Secondary | ICD-10-CM | POA: Diagnosis not present

## 2012-07-26 DIAGNOSIS — I8510 Secondary esophageal varices without bleeding: Secondary | ICD-10-CM | POA: Diagnosis not present

## 2012-07-26 DIAGNOSIS — Z85038 Personal history of other malignant neoplasm of large intestine: Secondary | ICD-10-CM | POA: Diagnosis not present

## 2012-08-01 DIAGNOSIS — K766 Portal hypertension: Secondary | ICD-10-CM | POA: Diagnosis not present

## 2012-08-01 DIAGNOSIS — Z79899 Other long term (current) drug therapy: Secondary | ICD-10-CM | POA: Diagnosis not present

## 2012-08-01 DIAGNOSIS — I10 Essential (primary) hypertension: Secondary | ICD-10-CM | POA: Diagnosis not present

## 2012-08-01 DIAGNOSIS — K319 Disease of stomach and duodenum, unspecified: Secondary | ICD-10-CM | POA: Diagnosis not present

## 2012-08-01 DIAGNOSIS — E119 Type 2 diabetes mellitus without complications: Secondary | ICD-10-CM | POA: Diagnosis not present

## 2012-08-01 DIAGNOSIS — I8501 Esophageal varices with bleeding: Secondary | ICD-10-CM | POA: Diagnosis not present

## 2012-08-22 ENCOUNTER — Ambulatory Visit (INDEPENDENT_AMBULATORY_CARE_PROVIDER_SITE_OTHER): Payer: Medicare Other | Admitting: Cardiology

## 2012-08-22 ENCOUNTER — Encounter: Payer: Self-pay | Admitting: Cardiology

## 2012-08-22 VITALS — BP 143/67 | HR 62 | Ht 62.0 in | Wt 137.0 lb

## 2012-08-22 DIAGNOSIS — E7800 Pure hypercholesterolemia, unspecified: Secondary | ICD-10-CM | POA: Diagnosis not present

## 2012-08-22 DIAGNOSIS — I119 Hypertensive heart disease without heart failure: Secondary | ICD-10-CM

## 2012-08-22 NOTE — Assessment & Plan Note (Signed)
The patient is on metformin thousand milligrams twice a day.  She will return later this week for a hemoglobin A1c.  She is not having any hypoglycemic episodes.  Her weight is down 12 pounds since last visit 6 months ago

## 2012-08-22 NOTE — Progress Notes (Signed)
Amber Crosby Date of Birth:  04-28-1946 [facility] [address] [place], [place]  [address] [phone]         Fax   [phone]  History of Present Illness: This pleasant 66 year old woman is seen for a scheduled followup office visit.[name] she has a past history of hypertension and diabetes and hypercholesterolemia. She also has a past history of esophageal varices and a past history of colon cancer. She is a problem with chronic mild anemia and also a problem with thrombocytopenia. Since last visit she has made a discovery concerning her diet. She feels that she has gluten intolerance. Since she has switched to a low gluten diet her symptoms have improved markedly in terms of bloating and postprandial diarrhea .  She has a history of portal hypertension secondary to previous chemotherapy and has developed esophageal varices which are being treated endoscopically at [facility]   Current Outpatient Prescriptions  Medication Sig Dispense Refill  . ALPRAZolam (XANAX) 0.25 MG tablet Take 0.25 mg by mouth at bedtime as needed.        [name] amLODipine (NORVASC) 5 MG tablet Take 1 tablet (5 mg total) by mouth daily.  90 tablet  3  . Cholecalciferol (VITAMIN D PO) Take by mouth daily.        . Coenzyme Q10 (CO Q 10 PO) Take by mouth daily.        . CRESTOR 5 MG tablet Take 1 tablet (5 mg total) by mouth daily.  90 tablet  3  . esomeprazole (NEXIUM) 20 MG capsule Take one tablet twice daily      . losartan-hydrochlorothiazide (HYZAAR) 100-12.5 MG per tablet Take 1 tablet by mouth daily.  90 tablet  3  . metFORMIN (GLUCOPHAGE) 500 MG tablet 2 twice daily  360 tablet  3  . NADOLOL PO Take 120 mg by mouth daily.      [name] ZETIA 10 MG tablet Take 1 tablet (10 mg total) by mouth daily.  90 tablet  3    Allergies  Allergen Reactions  . Meperidine And Related Nausea And Vomiting  . Contrast Media (Iodinated Diagnostic Agents)     Pt can not have IV dye under any circumstances  .  Iohexol     Pt had reaction (hives) even with pre-meds, so patient can not have IV dye for any CT   . Morphine And Related     Patient Active Problem List  Diagnosis  . Diabetes mellitus without mention of complication  . Benign hypertensive heart disease without heart failure  . Pure hypercholesterolemia  . Unspecified deficiency anemia  . Colon cancer    History  Smoking status  . Never Smoker   Smokeless tobacco  . Not on file    History  Alcohol Use: Not on file    Family History  Problem Relation Age of Onset  . Hypertension Mother   . Cancer Mother   . Diabetes Mother     Review of Systems: Constitutional: no fever chills diaphoresis or fatigue or change in weight.  Head and neck: no hearing loss, no epistaxis, no photophobia or visual disturbance. Respiratory: No cough, shortness of breath or wheezing. Cardiovascular: No chest pain peripheral edema, palpitations. Gastrointestinal: No abdominal distention, no abdominal pain, no change in bowel habits hematochezia or melena. Genitourinary: No dysuria, no frequency, no urgency, no nocturia. Musculoskeletal:No arthralgias, no back pain, no gait disturbance or myalgias. Neurological: No dizziness, no headaches, no numbness, no seizures, no syncope,  no weakness, no tremors. Hematologic: No lymphadenopathy, no easy bruising. Psychiatric: No confusion, no hallucinations, no sleep disturbance.    Physical Exam: Filed Vitals:   08/22/12 1141  BP: 143/67  Pulse: 62   the general appearance reveals a well-developed well-nourished woman in no distress.The head and neck exam reveals pupils equal and reactive.  Extraocular movements are full.  There is no scleral icterus.  The mouth and pharynx are normal.  The neck is supple.  The carotids reveal no bruits.  The jugular venous pressure is normal.  The  thyroid is not enlarged.  There is no lymphadenopathy.  The chest is clear to percussion and auscultation.  There are no  rales or rhonchi.  Expansion of the chest is symmetrical.  The precordium is quiet.  The first heart sound is normal.  The second heart sound is physiologically split.  There is no murmur gallop rub or click.  There is no abnormal lift or heave.  The abdomen is soft and nontender.  The bowel sounds are normal.  The liver and spleen are not enlarged.  There are no abdominal masses.  There are no abdominal bruits.  Extremities reveal good pedal pulses.  There is no phlebitis or edema.  There is no cyanosis or clubbing.  Strength is normal and symmetrical in all extremities.  There is no lateralizing weakness.  There are no sensory deficits.  The skin is warm and dry.  There is no rash.  EKG shows normal sinus rhythm and is within normal limits   Assessment / Plan: Patient is doing well from a cardiovascular standpoint.  She will return later this week for fasting lab work.  She will return in 6 months for office visit and fasting lab work.  Continue same medication for now.

## 2012-08-22 NOTE — Assessment & Plan Note (Signed)
The patient has a history of hypercholesterolemia and is on Crestor and ezetimibe.  Blood work will be obtained later this week fasting.  She's not having any side effects from the statin therapy

## 2012-08-22 NOTE — Assessment & Plan Note (Signed)
The patient has not been experiencing any chest pain or shortness of breath. 

## 2012-08-22 NOTE — Patient Instructions (Addendum)
Return Friday December 6 for fasting labs (anytime after 7:30)  Your physician recommends that you continue on your current medications as directed. Please refer to the Current Medication list given to you today.  Your physician wants you to follow-up in: 6 months with fasting labs (lp/bmet/hfp)  You will receive a reminder letter in the mail two months in advance. If you don't receive a letter, please call our office to schedule the follow-up appointment.

## 2012-08-25 ENCOUNTER — Other Ambulatory Visit (INDEPENDENT_AMBULATORY_CARE_PROVIDER_SITE_OTHER): Payer: Medicare Other

## 2012-08-25 DIAGNOSIS — E7800 Pure hypercholesterolemia, unspecified: Secondary | ICD-10-CM

## 2012-08-25 DIAGNOSIS — I119 Hypertensive heart disease without heart failure: Secondary | ICD-10-CM

## 2012-08-25 DIAGNOSIS — E119 Type 2 diabetes mellitus without complications: Secondary | ICD-10-CM | POA: Diagnosis not present

## 2012-08-25 LAB — HEMOGLOBIN A1C: Hgb A1c MFr Bld: 6.3 % (ref 4.6–6.5)

## 2012-08-25 NOTE — Progress Notes (Signed)
Quick Note:  Please report to patient. The recent labs are stable. Continue same medication and careful diet. ______ 

## 2012-08-30 ENCOUNTER — Ambulatory Visit: Payer: Medicare Other | Admitting: Cardiology

## 2012-08-30 ENCOUNTER — Telehealth: Payer: Self-pay | Admitting: *Deleted

## 2012-08-30 NOTE — Telephone Encounter (Signed)
Advised patient of lab results  

## 2012-08-30 NOTE — Telephone Encounter (Signed)
Message copied by Burnell Blanks on Wed Aug 30, 2012  8:23 AM ------      Message from: Cassell Clement      Created: Fri Aug 25, 2012  3:51 PM       Please report to patient.  The recent labs are stable. Continue same medication and careful diet.

## 2012-09-05 ENCOUNTER — Telehealth: Payer: Self-pay | Admitting: Cardiology

## 2012-09-26 DIAGNOSIS — Z79899 Other long term (current) drug therapy: Secondary | ICD-10-CM | POA: Diagnosis not present

## 2012-09-26 DIAGNOSIS — K766 Portal hypertension: Secondary | ICD-10-CM | POA: Diagnosis not present

## 2012-09-26 DIAGNOSIS — I10 Essential (primary) hypertension: Secondary | ICD-10-CM | POA: Diagnosis not present

## 2012-09-26 DIAGNOSIS — K922 Gastrointestinal hemorrhage, unspecified: Secondary | ICD-10-CM | POA: Diagnosis not present

## 2012-09-26 DIAGNOSIS — I8510 Secondary esophageal varices without bleeding: Secondary | ICD-10-CM | POA: Diagnosis not present

## 2012-09-26 DIAGNOSIS — E119 Type 2 diabetes mellitus without complications: Secondary | ICD-10-CM | POA: Diagnosis not present

## 2012-10-26 ENCOUNTER — Encounter: Payer: Self-pay | Admitting: Cardiology

## 2012-10-26 DIAGNOSIS — R634 Abnormal weight loss: Secondary | ICD-10-CM | POA: Diagnosis not present

## 2012-10-26 DIAGNOSIS — K529 Noninfective gastroenteritis and colitis, unspecified: Secondary | ICD-10-CM | POA: Diagnosis not present

## 2012-10-26 DIAGNOSIS — D509 Iron deficiency anemia, unspecified: Secondary | ICD-10-CM | POA: Diagnosis not present

## 2012-11-22 ENCOUNTER — Other Ambulatory Visit: Payer: Self-pay | Admitting: *Deleted

## 2012-11-22 MED ORDER — ZETIA 10 MG PO TABS: 10.0000 mg | ORAL_TABLET | Freq: Every day | ORAL | Status: DC

## 2012-11-28 DIAGNOSIS — H268 Other specified cataract: Secondary | ICD-10-CM | POA: Diagnosis not present

## 2012-12-01 ENCOUNTER — Telehealth: Payer: Self-pay | Admitting: *Deleted

## 2012-12-01 ENCOUNTER — Other Ambulatory Visit: Payer: Self-pay | Admitting: *Deleted

## 2012-12-01 DIAGNOSIS — I119 Hypertensive heart disease without heart failure: Secondary | ICD-10-CM

## 2012-12-01 MED ORDER — AMLODIPINE BESYLATE 5 MG PO TABS: 5.0000 mg | ORAL_TABLET | Freq: Every day | ORAL | Status: DC

## 2012-12-01 MED ORDER — NADOLOL 80 MG PO TABS: ORAL_TABLET | ORAL | Status: DC

## 2012-12-01 NOTE — Telephone Encounter (Signed)
Follow-up: ° ° ° °Patient called in returning your call.  Please call back. °

## 2012-12-01 NOTE — Telephone Encounter (Signed)
Refilled meds a requested

## 2012-12-01 NOTE — Telephone Encounter (Signed)
Received refill request from [facility] regarding Nadolol, unsure of dose. Left message to call back

## 2012-12-26 DIAGNOSIS — D509 Iron deficiency anemia, unspecified: Secondary | ICD-10-CM | POA: Diagnosis not present

## 2012-12-26 DIAGNOSIS — K529 Noninfective gastroenteritis and colitis, unspecified: Secondary | ICD-10-CM | POA: Diagnosis not present

## 2012-12-26 DIAGNOSIS — R109 Unspecified abdominal pain: Secondary | ICD-10-CM | POA: Diagnosis not present

## 2012-12-28 ENCOUNTER — Encounter: Payer: Self-pay | Admitting: Cardiology

## 2012-12-28 ENCOUNTER — Other Ambulatory Visit: Payer: Self-pay | Admitting: *Deleted

## 2012-12-28 DIAGNOSIS — I119 Hypertensive heart disease without heart failure: Secondary | ICD-10-CM

## 2012-12-28 DIAGNOSIS — E7800 Pure hypercholesterolemia, unspecified: Secondary | ICD-10-CM

## 2012-12-28 MED ORDER — CRESTOR 5 MG PO TABS: 5.0000 mg | ORAL_TABLET | Freq: Every day | ORAL | Status: DC

## 2012-12-28 MED ORDER — AMLODIPINE BESYLATE 5 MG PO TABS: 5.0000 mg | ORAL_TABLET | Freq: Every day | ORAL | Status: DC

## 2013-01-30 DIAGNOSIS — K319 Disease of stomach and duodenum, unspecified: Secondary | ICD-10-CM | POA: Diagnosis not present

## 2013-01-30 DIAGNOSIS — E119 Type 2 diabetes mellitus without complications: Secondary | ICD-10-CM | POA: Diagnosis not present

## 2013-01-30 DIAGNOSIS — I10 Essential (primary) hypertension: Secondary | ICD-10-CM | POA: Diagnosis not present

## 2013-01-30 DIAGNOSIS — Z85038 Personal history of other malignant neoplasm of large intestine: Secondary | ICD-10-CM | POA: Diagnosis not present

## 2013-01-30 DIAGNOSIS — Z79899 Other long term (current) drug therapy: Secondary | ICD-10-CM | POA: Diagnosis not present

## 2013-01-30 DIAGNOSIS — I8500 Esophageal varices without bleeding: Secondary | ICD-10-CM | POA: Diagnosis not present

## 2013-01-30 DIAGNOSIS — K766 Portal hypertension: Secondary | ICD-10-CM | POA: Diagnosis not present

## 2013-02-13 ENCOUNTER — Other Ambulatory Visit: Payer: Self-pay | Admitting: *Deleted

## 2013-02-13 DIAGNOSIS — E119 Type 2 diabetes mellitus without complications: Secondary | ICD-10-CM

## 2013-02-13 MED ORDER — METFORMIN HCL 500 MG PO TABS: ORAL_TABLET | ORAL | Status: DC

## 2013-02-15 ENCOUNTER — Encounter: Payer: Self-pay | Admitting: Cardiology

## 2013-03-01 ENCOUNTER — Other Ambulatory Visit (INDEPENDENT_AMBULATORY_CARE_PROVIDER_SITE_OTHER): Payer: Medicare Other

## 2013-03-01 DIAGNOSIS — E7800 Pure hypercholesterolemia, unspecified: Secondary | ICD-10-CM | POA: Diagnosis not present

## 2013-03-01 DIAGNOSIS — I119 Hypertensive heart disease without heart failure: Secondary | ICD-10-CM | POA: Diagnosis not present

## 2013-03-01 LAB — LIPID PANEL
HDL: 59 mg/dL (ref >39.00)
Total CHOL/HDL Ratio: 3
Triglycerides: 62 mg/dL (ref 0.0–149.0)
VLDL: 12.4 mg/dL (ref 0.0–40.0)

## 2013-03-01 LAB — BASIC METABOLIC PANEL
CO2: 33 mEq/L — ABNORMAL HIGH (ref 19–32)
Calcium: 9.7 mg/dL (ref 8.4–10.5)
Creatinine, Ser: 0.6 mg/dL (ref 0.4–1.2)

## 2013-03-01 LAB — HEPATIC FUNCTION PANEL
Albumin: 4 g/dL (ref 3.5–5.2)
Bilirubin, Direct: 0.1 mg/dL (ref 0.0–0.3)
Total Protein: 6.8 g/dL (ref 6.0–8.3)

## 2013-03-01 NOTE — Progress Notes (Signed)
Quick Note:  Please make copy of labs for patient visit. ______ 

## 2013-03-05 ENCOUNTER — Ambulatory Visit (INDEPENDENT_AMBULATORY_CARE_PROVIDER_SITE_OTHER): Payer: Medicare Other | Admitting: Cardiology

## 2013-03-05 ENCOUNTER — Encounter: Payer: Self-pay | Admitting: Cardiology

## 2013-03-05 VITALS — BP 122/72 | HR 64 | Ht 62.0 in | Wt 139.1 lb

## 2013-03-05 DIAGNOSIS — I119 Hypertensive heart disease without heart failure: Secondary | ICD-10-CM

## 2013-03-05 DIAGNOSIS — E7800 Pure hypercholesterolemia, unspecified: Secondary | ICD-10-CM

## 2013-03-05 MED ORDER — ALPRAZOLAM 0.25 MG PO TABS: 0.2500 mg | ORAL_TABLET | Freq: Every evening | ORAL | Status: DC | PRN

## 2013-03-05 NOTE — Assessment & Plan Note (Signed)
Her lipids and chemistries were reviewed.  She is doing well.  Liver function studies are remaining normal.

## 2013-03-05 NOTE — Patient Instructions (Addendum)
Your physician wants you to follow-up in: 6 MONTHS WITH EKG/ LABS You will receive a reminder letter in the mail two months in advance. If you don't receive a letter, please call our office to schedule the follow-up appointment.  Your physician recommends that you return for a FASTING lipid profile: 6 MONTHS   Your physician recommends that you continue on your current medications as directed. Please refer to the Current Medication list given to you today.

## 2013-03-05 NOTE — Assessment & Plan Note (Signed)
No chest pain or shortness of breath.  No palpitations.  She tries not to get her heart rate elevated at the instruction of her gastroenterology endoscopist at [facility].  She tries to keep her heart rate below 64

## 2013-03-05 NOTE — Progress Notes (Signed)
Joaquin Courts Date of Birth:  1946-05-29 [facility] [address] [place], [place]  [address] [phone]         Fax   [phone]  History of Present Illness:  This pleasant 67 year old woman is seen for a scheduled followup office visit.[name] she has a past history of hypertension and diabetes and hypercholesterolemia. She also has a past history of esophageal varices and a past history of colon cancer. She is a problem with chronic mild anemia and also a problem with thrombocytopenia. Since last visit she has made a discovery concerning her diet. She feels that she has gluten intolerance. Since she has switched to a low gluten diet her symptoms have improved markedly in terms of bloating and postprandial diarrhea . She has a history of portal hypertension secondary to previous chemotherapy and has developed esophageal varices which are being treated endoscopically at [facility].  Since last visit she has been generally doing well.   Current Outpatient Prescriptions  Medication Sig Dispense Refill  . ALPRAZolam (XANAX) 0.25 MG tablet Take 1 tablet (0.25 mg total) by mouth at bedtime as needed.  30 tablet  3  . amLODipine (NORVASC) 5 MG tablet Take 1 tablet (5 mg total) by mouth daily.  90 tablet  3  . Cholecalciferol (VITAMIN D PO) Take by mouth daily.        . Coenzyme Q10 (CO Q 10 PO) Take by mouth daily.        . CRESTOR 5 MG tablet Take 1 tablet (5 mg total) by mouth daily.  90 tablet  3  . dicyclomine (BENTYL) 10 MG capsule Take 10 mg by mouth as needed.      [name] losartan-hydrochlorothiazide (HYZAAR) 100-12.5 MG per tablet Take 1 tablet by mouth daily.  90 tablet  3  . metFORMIN (GLUCOPHAGE) 500 MG tablet 2 twice daily  360 tablet  3  . nadolol (CORGARD) 80 MG tablet 1 and 1/2 tablets daily  135 tablet  3  . ZETIA 10 MG tablet Take 1 tablet (10 mg total) by mouth daily.  90 tablet  3   No current facility-administered medications for this visit.    Allergies    Allergen Reactions  . Meperidine And Related Nausea And Vomiting  . Contrast Media (Iodinated Diagnostic Agents)     Pt can not have IV dye under any circumstances  . Iohexol     Pt had reaction (hives) even with pre-meds, so patient can not have IV dye for any CT   . Morphine And Related     Patient Active Problem List   Diagnosis Date Noted  . Unspecified deficiency anemia 03/28/2012  . Colon cancer 03/28/2012  . Diabetes mellitus without mention of complication 07/30/2011  . Benign hypertensive heart disease without heart failure 07/30/2011  . Pure hypercholesterolemia 07/30/2011    History  Smoking status  . Never Smoker   Smokeless tobacco  . Not on file    History  Alcohol Use: Not on file    Family History  Problem Relation Age of Onset  . Hypertension Mother   . Cancer Mother   . Diabetes Mother     Review of Systems: Constitutional: no fever chills diaphoresis or fatigue or change in weight.  Head and neck: no hearing loss, no epistaxis, no photophobia or visual disturbance. Respiratory: No cough, shortness of breath or wheezing. Cardiovascular: No chest pain peripheral edema, palpitations. Gastrointestinal: No abdominal distention, no abdominal pain, no change in  bowel habits hematochezia or melena. Genitourinary: No dysuria, no frequency, no urgency, no nocturia. Musculoskeletal:No arthralgias, no back pain, no gait disturbance or myalgias. Neurological: No dizziness, no headaches, no numbness, no seizures, no syncope, no weakness, no tremors. Hematologic: No lymphadenopathy, no easy bruising. Psychiatric: No confusion, no hallucinations, no sleep disturbance.    Physical Exam: Filed Vitals:   03/05/13 1055  BP: 122/72  Pulse: 64   the general appearance reveals a well-developed well-nourished woman in no distress.The head and neck exam reveals pupils equal and reactive.  Extraocular movements are full.  There is no scleral icterus.  The mouth and  pharynx are normal.  The neck is supple.  The carotids reveal no bruits.  The jugular venous pressure is normal.  The  thyroid is not enlarged.  There is no lymphadenopathy.  The chest is clear to percussion and auscultation.  There are no rales or rhonchi.  Expansion of the chest is symmetrical.  The precordium is quiet.  The first heart sound is normal.  The second heart sound is physiologically split.  There is no murmur gallop rub or click.  There is no abnormal lift or heave.  The abdomen is soft and nontender.  The bowel sounds are normal.  The liver and spleen are not enlarged.  There are no abdominal masses.  There are no abdominal bruits.  Extremities reveal good pedal pulses.  There is no phlebitis or edema.  There is no cyanosis or clubbing.  Strength is normal and symmetrical in all extremities.  There is no lateralizing weakness.  There are no sensory deficits.  The skin is warm and dry.  There is no rash.    Assessment / Plan:  Continue same medication.  We refilled her Xanax.  Recheck 6 months for office visit EKG and fasting lab work.

## 2013-03-05 NOTE — Assessment & Plan Note (Signed)
The patient has not been experiencing any glycemic episodes.

## 2013-03-22 ENCOUNTER — Other Ambulatory Visit: Payer: Self-pay | Admitting: Cardiology

## 2013-03-22 DIAGNOSIS — R110 Nausea: Secondary | ICD-10-CM

## 2013-04-04 ENCOUNTER — Other Ambulatory Visit: Payer: Self-pay | Admitting: Cardiology

## 2013-04-09 DIAGNOSIS — Z8500 Personal history of malignant neoplasm of unspecified digestive organ: Secondary | ICD-10-CM | POA: Diagnosis not present

## 2013-04-09 DIAGNOSIS — D509 Iron deficiency anemia, unspecified: Secondary | ICD-10-CM | POA: Diagnosis not present

## 2013-04-09 DIAGNOSIS — K529 Noninfective gastroenteritis and colitis, unspecified: Secondary | ICD-10-CM | POA: Diagnosis not present

## 2013-06-12 IMAGING — CT CT ABD-PELV W/O CM
2 of 4 series · 15 of 46 positions shown, 17 images · non-contrast
Comparison: CT of abdomen and pelvis 08/28/2010.

CT CHEST

CLINICAL DATA: History of colon cancer.  Restaging scan.

CT CHEST, ABDOMEN AND PELVIS WITHOUT CONTRAST
TECHNIQUE: Multidetector CT imaging of the chest, abdomen and
pelvis was performed following the standard protocol without IV
contrast.

[Series 2: cap w/o w/o st · axial · non-contrast · 0.71mm/px · z∈[-590,-64]mm · 12 of 117 slices shown, 14 images]
[im 6/117  soft-tissue]
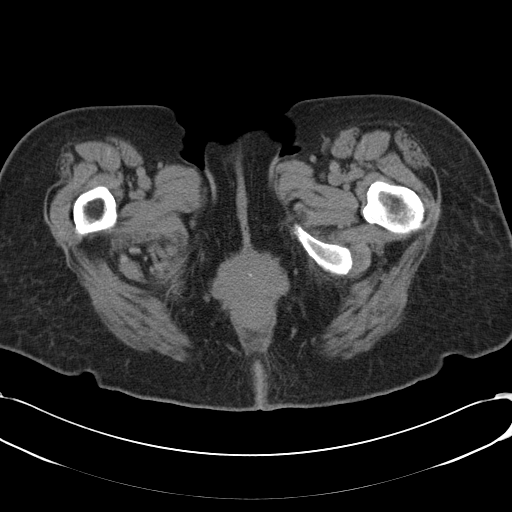
[im 6/117  bone]
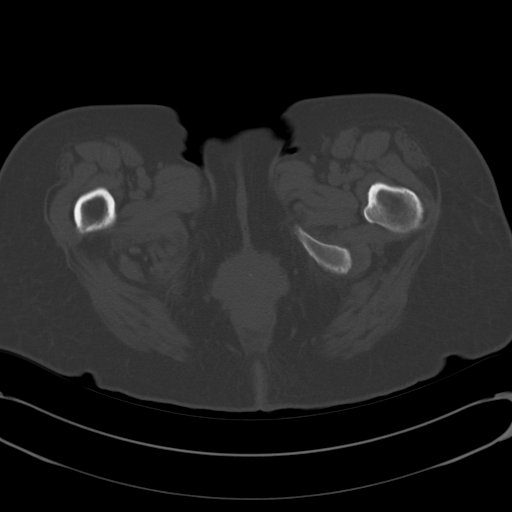
[im 16/117  soft-tissue]
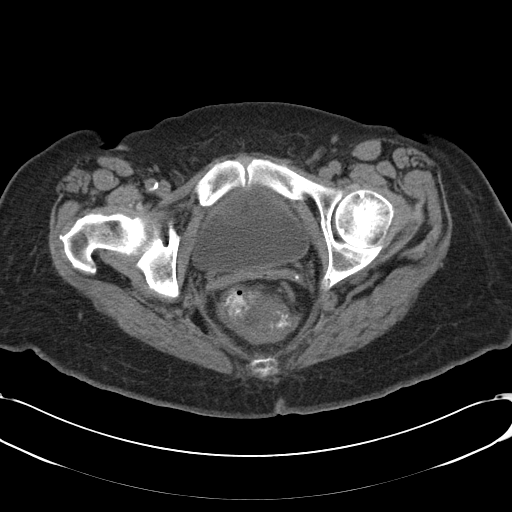
[im 27/117  soft-tissue]
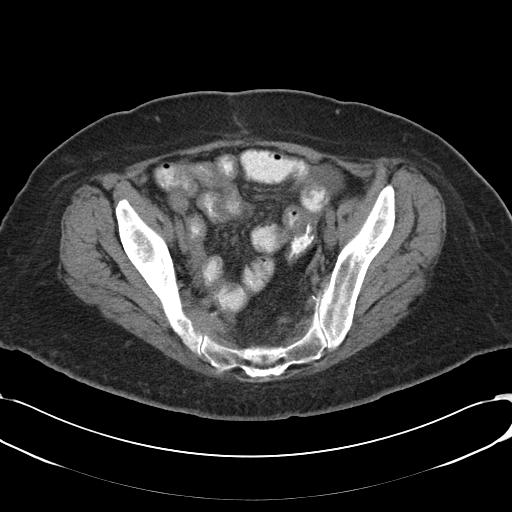
[im 37/117  soft-tissue]
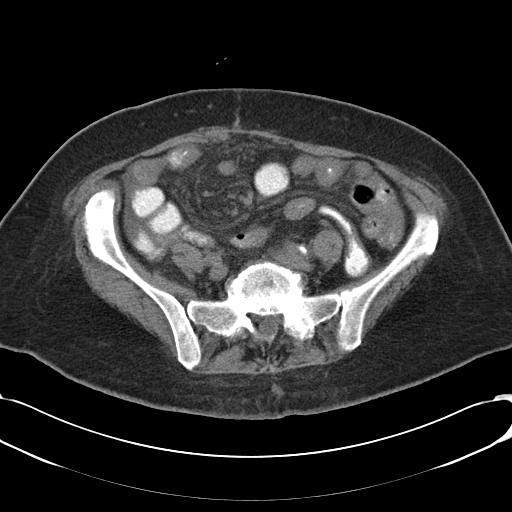
[im 43/117  soft-tissue]
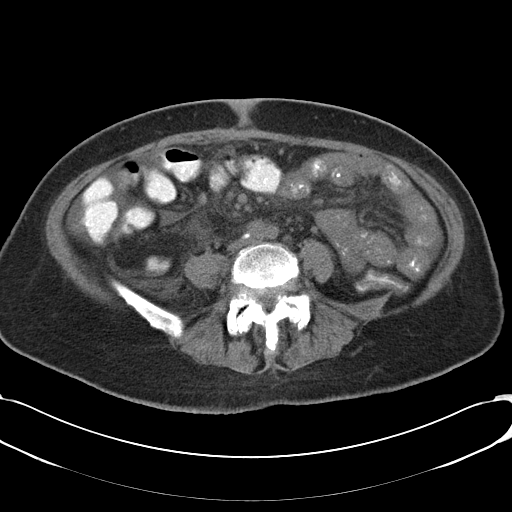
[im 53/117  soft-tissue]
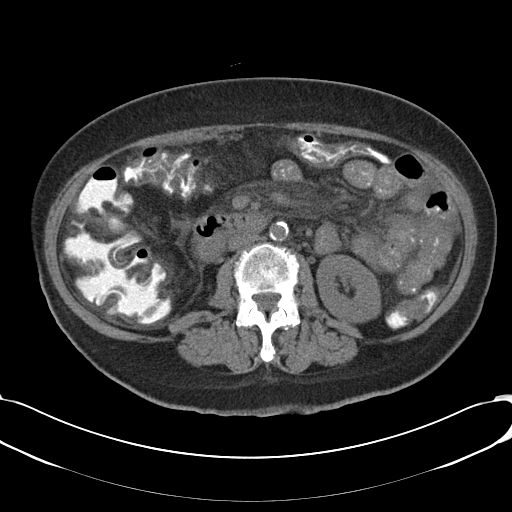
[im 64/117  soft-tissue]
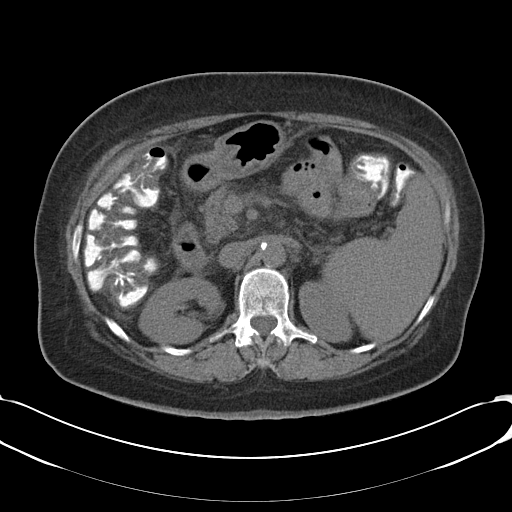
[im 74/117  soft-tissue]
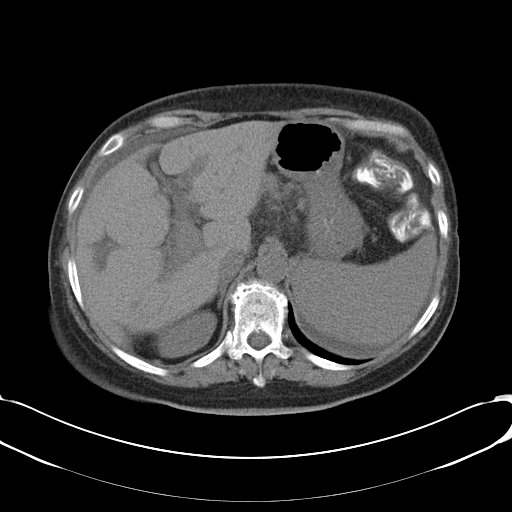
[im 80/117  soft-tissue]
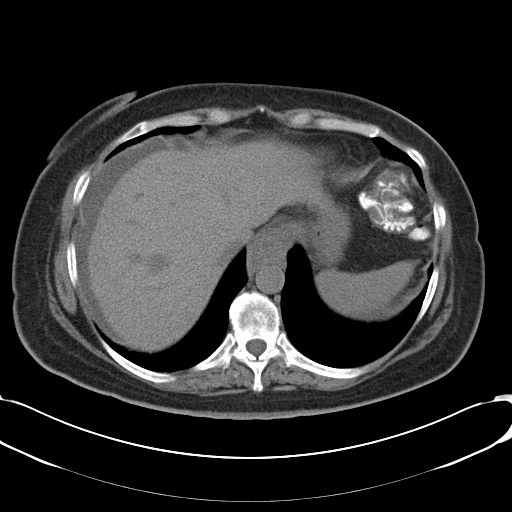
[im 80/117  bone]
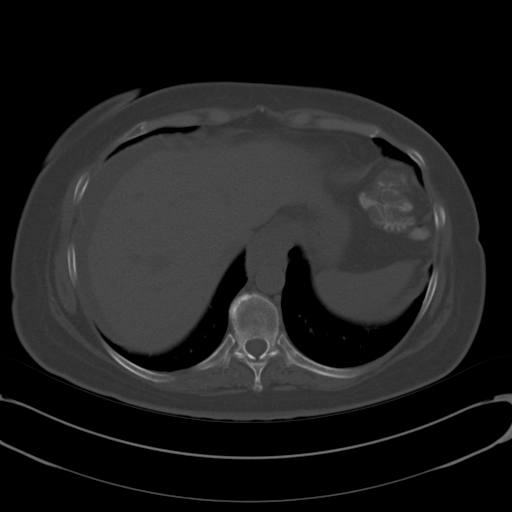
[im 90/117  soft-tissue]
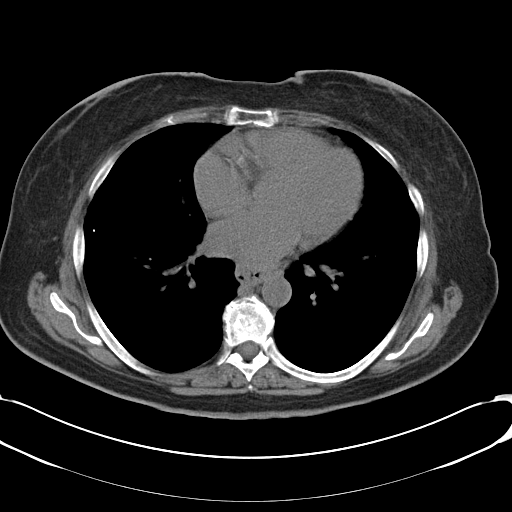
[im 101/117  soft-tissue]
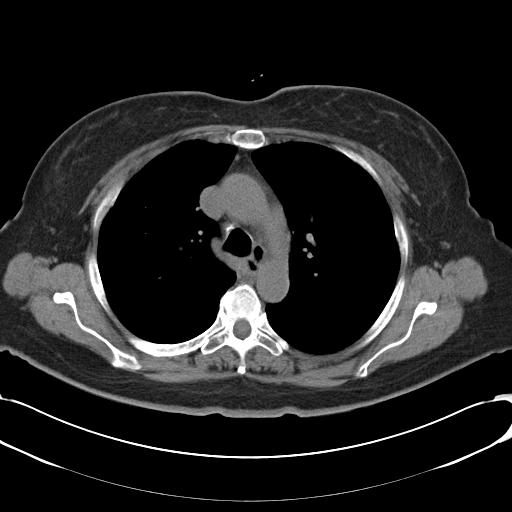
[im 111/117  soft-tissue]
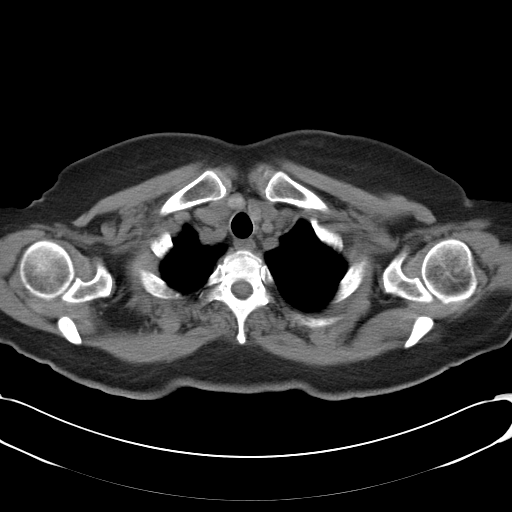

[Series 602: <mpr thick range> · coronal · 1.14mm/px · 3 of 86 slices shown]
[im 29/86  soft-tissue]
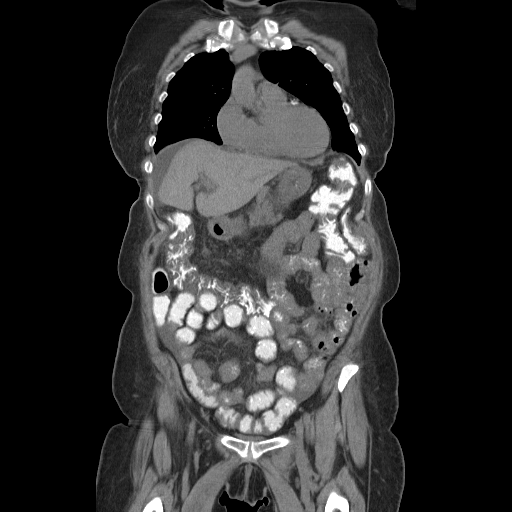
[im 38/86  soft-tissue]
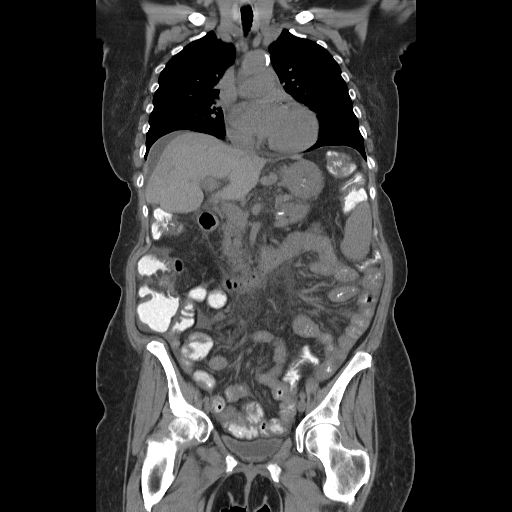
[im 48/86  soft-tissue]
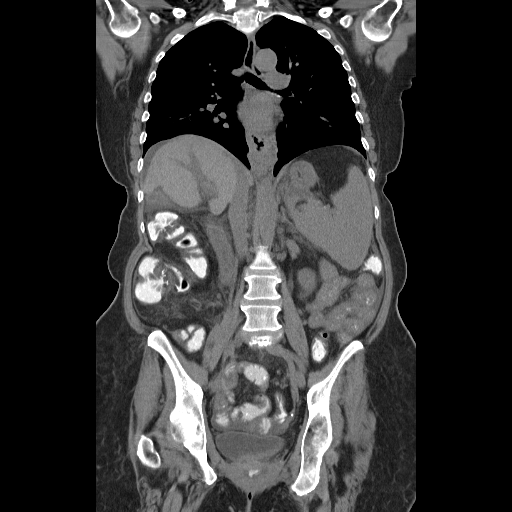

[15 of 46 positions shown; findings below may reference images not displayed]

FINDINGS: Mediastinum: Heart size is normal. There is no significant
pericardial fluid, thickening or pericardial calcification. There
is atherosclerosis of the thoracic aorta, the great vessels of the
mediastinum and the coronary arteries, including calcified
atherosclerotic plaque in the left anterior descending, left
circumflex and right coronary arteries. No pathologically enlarged
mediastinal or hilar lymph nodes. Please note that accurate
exclusion of hilar adenopathy is limited on noncontrast CT scans.
There is a small hiatal hernia. Irregular thickening of the distal
esophageal wall.

Lungs/Pleura: There are multiple tiny 2-3 mm pulmonary nodules
scattered throughout the lungs bilaterally which appear unchanged
in size, number and distribution compared to prior study
04/25/2009, and can therefore be considered radiographically
benign.  Several of these nodules are calcified, consistent with
calcified granulomas. No new or enlarging suspicious appearing
pulmonary nodules or masses are otherwise identified.  No
consolidative airspace disease.  No pleural effusions.

Musculoskeletal: There are no aggressive appearing lytic or blastic
lesions noted in the visualized portions of the skeleton.
IMPRESSION: 1.  No evidence to suggest metastatic disease to the thorax on
today's examination.
2.  Moderate sized hiatal hernia with irregular thickening of the
distal third of the esophagus.  This may represent changes of
reflux esophagitis, or could represent varices.  However, if there
is clinical concern for Loving metaplasia or esophageal
neoplasia, further evaluation with endoscopy may be warranted.
3. Atherosclerosis, including three-vessel coronary artery disease.
Assessment for potential risk factor modification, dietary therapy
or pharmacologic therapy may be warranted, if clinically indicated.

CT ABDOMEN AND PELVIS
FINDINGS: Abdomen/Pelvis: The liver has a slightly shrunken appearance and
nodular contour, suggestive of underlying cirrhosis.  There is
dilatation of the portal vein (17 mm in diameter), suggesting
portal hypertension.  Splenomegaly is similar to priors.  There are
numerous serpiginous densities in the region of the lesser
curvature of the stomach, likely represent gastric varices.  These
extend toward the GE junction.  There is a small volume of
perihepatic ascites which tracks into the pelvis to the right
paracolic gutter.  The gallbladder is contracted and thick-walled,
similar to numerous prior examinations, which could suggest chronic
cholecystitis.  The appearance of the adrenal glands and kidneys
bilaterally is unremarkable.

Atherosclerosis throughout the abdominal and pelvic vasculature,
without definite aneurysm.  No pneumoperitoneum.  No pathologic
distension of bowel.  The patient is status post hysterectomy.
Ovaries are atrophic.  Urinary bladder is unremarkable in
appearance.

Musculoskeletal: There are no aggressive appearing lytic or blastic
lesions noted in the visualized portions of the skeleton.  6 mm of
anterolisthesis of L4 on L5 is noted (unchanged).
IMPRESSION: 1.  No definitive findings to suggest metastatic disease in the
abdomen or pelvis.
2.  The appearance of the liver suggests cirrhosis, and there is
evidence of portal hypertension, including splenomegaly, dilatation
of the portal vein, probable gastric and distal esophageal varices,
and a small volume of ascites, as detailed above.
3.  Persistent contracted and thick-walled gallbladder suggestive
of chronic cholecystitis.

## 2013-06-29 DIAGNOSIS — Z23 Encounter for immunization: Secondary | ICD-10-CM | POA: Diagnosis not present

## 2013-08-03 ENCOUNTER — Other Ambulatory Visit: Payer: Self-pay | Admitting: Gastroenterology

## 2013-08-03 DIAGNOSIS — Z86010 Personal history of colonic polyps: Secondary | ICD-10-CM | POA: Diagnosis not present

## 2013-08-03 DIAGNOSIS — D126 Benign neoplasm of colon, unspecified: Secondary | ICD-10-CM | POA: Diagnosis not present

## 2013-08-03 DIAGNOSIS — Z09 Encounter for follow-up examination after completed treatment for conditions other than malignant neoplasm: Secondary | ICD-10-CM | POA: Diagnosis not present

## 2013-08-03 DIAGNOSIS — K6389 Other specified diseases of intestine: Secondary | ICD-10-CM | POA: Diagnosis not present

## 2013-08-03 DIAGNOSIS — Z85038 Personal history of other malignant neoplasm of large intestine: Secondary | ICD-10-CM | POA: Diagnosis not present

## 2013-08-03 DIAGNOSIS — Z980 Intestinal bypass and anastomosis status: Secondary | ICD-10-CM | POA: Diagnosis not present

## 2013-08-14 DIAGNOSIS — K766 Portal hypertension: Secondary | ICD-10-CM | POA: Diagnosis not present

## 2013-08-21 ENCOUNTER — Other Ambulatory Visit (INDEPENDENT_AMBULATORY_CARE_PROVIDER_SITE_OTHER): Payer: Medicare Other

## 2013-08-21 DIAGNOSIS — E7800 Pure hypercholesterolemia, unspecified: Secondary | ICD-10-CM

## 2013-08-21 DIAGNOSIS — I119 Hypertensive heart disease without heart failure: Secondary | ICD-10-CM

## 2013-08-21 LAB — HEPATIC FUNCTION PANEL
Bilirubin, Direct: 0.3 mg/dL (ref 0.0–0.3)
Total Bilirubin: 1.8 mg/dL — ABNORMAL HIGH (ref 0.3–1.2)

## 2013-08-21 LAB — BASIC METABOLIC PANEL
BUN: 12 mg/dL (ref 6–23)
CO2: 26 mEq/L (ref 19–32)
Chloride: 101 mEq/L (ref 96–112)
Glucose, Bld: 111 mg/dL — ABNORMAL HIGH (ref 70–99)
Potassium: 4.9 mEq/L (ref 3.5–5.1)

## 2013-08-21 LAB — LIPID PANEL
HDL: 57 mg/dL (ref >39.00)
LDL Cholesterol: 89 mg/dL (ref 0–99)
VLDL: 13.8 mg/dL (ref 0.0–40.0)

## 2013-08-21 NOTE — Progress Notes (Signed)
Quick Note:  Please make copy of labs for patient visit. ______ 

## 2013-08-27 ENCOUNTER — Ambulatory Visit (INDEPENDENT_AMBULATORY_CARE_PROVIDER_SITE_OTHER): Payer: Medicare Other | Admitting: Cardiology

## 2013-08-27 ENCOUNTER — Encounter: Payer: Self-pay | Admitting: Cardiology

## 2013-08-27 VITALS — BP 114/76 | HR 63 | Ht 61.0 in | Wt 140.4 lb

## 2013-08-27 DIAGNOSIS — R6889 Other general symptoms and signs: Secondary | ICD-10-CM | POA: Diagnosis not present

## 2013-08-27 DIAGNOSIS — E7800 Pure hypercholesterolemia, unspecified: Secondary | ICD-10-CM

## 2013-08-27 DIAGNOSIS — I119 Hypertensive heart disease without heart failure: Secondary | ICD-10-CM | POA: Diagnosis not present

## 2013-08-27 LAB — TSH: TSH: 0.77 u[IU]/mL (ref 0.35–5.50)

## 2013-08-27 NOTE — Patient Instructions (Signed)
Will obtain labs today and call you with the results (tsh)  Your physician recommends that you continue on your current medications as directed. Please refer to the Current Medication list given to you today.  Your physician wants you to follow-up in:6 months with fasting labs (lp/bmet/hfp)  You will receive a reminder letter in the mail two months in advance. If you don't receive a letter, please call our office to schedule the follow-up appointment.

## 2013-08-27 NOTE — Assessment & Plan Note (Signed)
The patient has a history of hypercholesterolemia.  She is on Crestor and ezetimibe.  Blood work is satisfactory we will continue same dose.  She is not having any myalgias or other side effects.

## 2013-08-27 NOTE — Progress Notes (Signed)
Joaquin Courts Date of Birth:  12/19/1945 [address] [place], [place]  [address] [phone]         Fax   [phone]  History of Present Illness:  This pleasant 67 year old woman is seen for a scheduled followup office visit.[name] she has a past history of hypertension and diabetes and hypercholesterolemia. She also has a past history of esophageal varices and a past history of colon cancer. She is a problem with chronic mild anemia and also a problem with thrombocytopenia.  She thinks that she may have some gluten intolerance.. She has a history of portal hypertension secondary to previous chemotherapy and has developed esophageal varices which are being treated endoscopically at [facility].  Since last visit she has been generally doing well.  She had a recent colonoscopy which showed some minor inflammation but biopsies were negative and there was no evidence of recurrent colon cancer.  Current Outpatient Prescriptions  Medication Sig Dispense Refill  . ALPRAZolam (XANAX) 0.25 MG tablet Take 1 tablet (0.25 mg total) by mouth at bedtime as needed.  30 tablet  3  . amLODipine (NORVASC) 5 MG tablet Take 1 tablet (5 mg total) by mouth daily.  90 tablet  3  . Cholecalciferol (VITAMIN D PO) Take by mouth daily.        . CRESTOR 5 MG tablet Take 1 tablet (5 mg total) by mouth daily.  90 tablet  3  . losartan-hydrochlorothiazide (HYZAAR) 100-12.5 MG per tablet TAKE 1 TABLET DAILY  90 tablet  3  . metFORMIN (GLUCOPHAGE) 500 MG tablet 2 twice daily  360 tablet  3  . nadolol (CORGARD) 80 MG tablet 1 and 1/2 tablets daily  135 tablet  3  . ondansetron (ZOFRAN-ODT) 4 MG disintegrating tablet TAKE 1 TABLET EVERY 6 HOURS AS NEEDED FOR NAUSEA  30 tablet  0  . ZETIA 10 MG tablet Take 1 tablet (10 mg total) by mouth daily.  90 tablet  3  . Coenzyme Q10 (CO Q 10 PO) Take by mouth daily.         No current facility-administered medications for this visit.    Allergies  Allergen Reactions   . Meperidine And Related Nausea And Vomiting  . Contrast Media [Iodinated Diagnostic Agents]     Pt can not have IV dye under any circumstances  . Iohexol     Pt had reaction (hives) even with pre-meds, so patient can not have IV dye for any CT   . Morphine And Related     Patient Active Problem List   Diagnosis Date Noted  . Cold intolerance 08/27/2013  . Unspecified deficiency anemia 03/28/2012  . Colon cancer 03/28/2012  . Diabetes mellitus without mention of complication 07/30/2011  . Benign hypertensive heart disease without heart failure 07/30/2011  . Pure hypercholesterolemia 07/30/2011    History  Smoking status  . Never Smoker   Smokeless tobacco  . Not on file    History  Alcohol Use: Not on file    Family History  Problem Relation Age of Onset  . Hypertension Mother   . Cancer Mother   . Diabetes Mother     Review of Systems: Constitutional: no fever chills diaphoresis or fatigue or change in weight.  Head and neck: no hearing loss, no epistaxis, no photophobia or visual disturbance. Respiratory: No cough, shortness of breath or wheezing. Cardiovascular: No chest pain peripheral edema, palpitations. Gastrointestinal: No abdominal distention, no abdominal pain, no change in bowel habits  hematochezia or melena. Genitourinary: No dysuria, no frequency, no urgency, no nocturia. Musculoskeletal:No arthralgias, no back pain, no gait disturbance or myalgias. Neurological: No dizziness, no headaches, no numbness, no seizures, no syncope, no weakness, no tremors. Hematologic: No lymphadenopathy, no easy bruising. Psychiatric: No confusion, no hallucinations, no sleep disturbance.    Physical Exam: Filed Vitals:   08/27/13 0923  BP: 114/76  Pulse: 63   the general appearance reveals a well-developed well-nourished woman in no distress.The head and neck exam reveals pupils equal and reactive.  Extraocular movements are full.  There is no scleral icterus.  The  mouth and pharynx are normal.  The neck is supple.  The carotids reveal no bruits.  The jugular venous pressure is normal.  The  thyroid is not enlarged.  There is no lymphadenopathy.  The chest is clear to percussion and auscultation.  There are no rales or rhonchi.  Expansion of the chest is symmetrical.  The precordium is quiet.  The first heart sound is normal.  The second heart sound is physiologically split.  There is no murmur gallop rub or click.  There is no abnormal lift or heave.  The abdomen is soft and nontender.  The bowel sounds are normal.  The liver and spleen are not enlarged.  There are no abdominal masses.  There are no abdominal bruits.  Extremities reveal good pedal pulses.  There is no phlebitis or edema.  There is no cyanosis or clubbing.  Strength is normal and symmetrical in all extremities.  There is no lateralizing weakness.  There are no sensory deficits.  The skin is warm and dry.  There is no rash.  EKG today shows normal sinus rhythm and no ischemic changes.  There are minor nonspecific T-wave flattening in the lateral leads.  Assessment / Plan:  Continue same medication.  Overall the patient is doing well.  She has been under more stress.  Her daughter is in the midst of a marital separation. She also unexpectedly lost her good friend Clabe Seal recently.  The patient is to continue same medication and be rechecked in 6 months for office visit lipid panel hepatic function panel and basal metabolic panel.  We are checking a TSH today because of history of cold intolerance.

## 2013-08-27 NOTE — Assessment & Plan Note (Signed)
The patient has been having some cold intolerance.  We have not checked any thyroid function in several years.  We will get a TSH level today.  Her weight is up 1 pound since last visit

## 2013-08-27 NOTE — Assessment & Plan Note (Signed)
The patient has not been having any symptoms of CHF.  No chest pain or shortness of breath.  No palpitations.  No orthopnea.

## 2013-08-27 NOTE — Assessment & Plan Note (Signed)
The patient has not been having any hypoglycemic episodes.  She is tolerating metformin.

## 2013-08-27 NOTE — Progress Notes (Signed)
Quick Note:  Please report to patient. The recent labs are stable. Continue same medication and careful diet. Thyroid level is normal. ______

## 2013-08-28 ENCOUNTER — Telehealth: Payer: Self-pay | Admitting: *Deleted

## 2013-08-28 NOTE — Telephone Encounter (Signed)
Advised patient of lab results  

## 2013-08-28 NOTE — Telephone Encounter (Signed)
Message copied by Burnell Blanks on Tue Aug 28, 2013  8:49 AM ------      Message from: Cassell Clement      Created: Mon Aug 27, 2013  9:19 PM       Please report to patient.  The recent labs are stable. Continue same medication and careful diet. Thyroid level is normal. ------

## 2013-09-19 ENCOUNTER — Encounter: Payer: Self-pay | Admitting: Cardiology

## 2013-11-27 DIAGNOSIS — H268 Other specified cataract: Secondary | ICD-10-CM | POA: Diagnosis not present

## 2013-11-27 DIAGNOSIS — E119 Type 2 diabetes mellitus without complications: Secondary | ICD-10-CM | POA: Diagnosis not present

## 2013-11-27 DIAGNOSIS — H5200 Hypermetropia, unspecified eye: Secondary | ICD-10-CM | POA: Diagnosis not present

## 2014-01-01 DIAGNOSIS — I10 Essential (primary) hypertension: Secondary | ICD-10-CM | POA: Diagnosis not present

## 2014-01-01 DIAGNOSIS — I8510 Secondary esophageal varices without bleeding: Secondary | ICD-10-CM | POA: Diagnosis not present

## 2014-01-01 DIAGNOSIS — Z85038 Personal history of other malignant neoplasm of large intestine: Secondary | ICD-10-CM | POA: Diagnosis not present

## 2014-01-01 DIAGNOSIS — I8500 Esophageal varices without bleeding: Secondary | ICD-10-CM | POA: Diagnosis not present

## 2014-01-01 DIAGNOSIS — K319 Disease of stomach and duodenum, unspecified: Secondary | ICD-10-CM | POA: Diagnosis not present

## 2014-01-01 DIAGNOSIS — I868 Varicose veins of other specified sites: Secondary | ICD-10-CM | POA: Diagnosis not present

## 2014-01-01 DIAGNOSIS — E119 Type 2 diabetes mellitus without complications: Secondary | ICD-10-CM | POA: Diagnosis not present

## 2014-01-01 DIAGNOSIS — K766 Portal hypertension: Secondary | ICD-10-CM | POA: Diagnosis not present

## 2014-01-24 ENCOUNTER — Encounter: Payer: Self-pay | Admitting: Cardiology

## 2014-01-29 DIAGNOSIS — K766 Portal hypertension: Secondary | ICD-10-CM | POA: Diagnosis not present

## 2014-01-29 DIAGNOSIS — E119 Type 2 diabetes mellitus without complications: Secondary | ICD-10-CM | POA: Diagnosis not present

## 2014-01-29 DIAGNOSIS — Z90710 Acquired absence of both cervix and uterus: Secondary | ICD-10-CM | POA: Diagnosis not present

## 2014-01-29 DIAGNOSIS — Z79899 Other long term (current) drug therapy: Secondary | ICD-10-CM | POA: Diagnosis not present

## 2014-01-29 DIAGNOSIS — I10 Essential (primary) hypertension: Secondary | ICD-10-CM | POA: Diagnosis not present

## 2014-01-29 DIAGNOSIS — Z885 Allergy status to narcotic agent status: Secondary | ICD-10-CM | POA: Diagnosis not present

## 2014-01-29 DIAGNOSIS — K319 Disease of stomach and duodenum, unspecified: Secondary | ICD-10-CM | POA: Diagnosis not present

## 2014-01-29 DIAGNOSIS — I868 Varicose veins of other specified sites: Secondary | ICD-10-CM | POA: Diagnosis not present

## 2014-01-29 DIAGNOSIS — I8510 Secondary esophageal varices without bleeding: Secondary | ICD-10-CM | POA: Diagnosis not present

## 2014-01-29 DIAGNOSIS — Z888 Allergy status to other drugs, medicaments and biological substances status: Secondary | ICD-10-CM | POA: Diagnosis not present

## 2014-01-29 DIAGNOSIS — I8500 Esophageal varices without bleeding: Secondary | ICD-10-CM | POA: Diagnosis not present

## 2014-01-29 DIAGNOSIS — Z91041 Radiographic dye allergy status: Secondary | ICD-10-CM | POA: Diagnosis not present

## 2014-01-29 DIAGNOSIS — E785 Hyperlipidemia, unspecified: Secondary | ICD-10-CM | POA: Insufficient documentation

## 2014-02-23 ENCOUNTER — Other Ambulatory Visit: Payer: Self-pay | Admitting: Cardiology

## 2014-02-27 ENCOUNTER — Encounter: Payer: Self-pay | Admitting: Cardiology

## 2014-03-04 ENCOUNTER — Other Ambulatory Visit: Payer: Medicare Other

## 2014-03-07 ENCOUNTER — Ambulatory Visit (INDEPENDENT_AMBULATORY_CARE_PROVIDER_SITE_OTHER): Payer: Medicare Other | Admitting: Cardiology

## 2014-03-07 ENCOUNTER — Encounter: Payer: Self-pay | Admitting: Cardiology

## 2014-03-07 VITALS — BP 120/70 | HR 60 | Ht 62.0 in | Wt 141.6 lb

## 2014-03-07 DIAGNOSIS — IMO0001 Reserved for inherently not codable concepts without codable children: Secondary | ICD-10-CM

## 2014-03-07 DIAGNOSIS — E7800 Pure hypercholesterolemia, unspecified: Secondary | ICD-10-CM

## 2014-03-07 DIAGNOSIS — C189 Malignant neoplasm of colon, unspecified: Secondary | ICD-10-CM | POA: Diagnosis not present

## 2014-03-07 DIAGNOSIS — E119 Type 2 diabetes mellitus without complications: Secondary | ICD-10-CM

## 2014-03-07 DIAGNOSIS — I119 Hypertensive heart disease without heart failure: Secondary | ICD-10-CM

## 2014-03-07 DIAGNOSIS — E1165 Type 2 diabetes mellitus with hyperglycemia: Secondary | ICD-10-CM

## 2014-03-07 NOTE — Assessment & Plan Note (Signed)
Her blood pressure has been remaining stable. She is not having any exertional chest pain or shortness of breath or palpitations.  No dizziness or syncope.

## 2014-03-07 NOTE — Assessment & Plan Note (Signed)
No evidence of any recurrence of her colon cancer.  No evidence of any active GI bleeding.

## 2014-03-07 NOTE — Patient Instructions (Addendum)
Your physician recommends that you continue on your current medications as directed. Please refer to the Current Medication list given to you today.   Your physician recommends that you return for lab work on June 22 for cbc/lipid/lft/bmet will call with the results   Your physician wants you to follow-up in: 6 months with fasting labs (lp/bmet/hfp/tsh/A1C/)   You will receive a reminder letter in the mail two months in advance. If you don't receive a letter, please call our office to schedule the follow-up appointment @ [phone].

## 2014-03-07 NOTE — Progress Notes (Signed)
Arta Silence Date of Birth:  Aug 21, 1946 [address] [place], [place]  [address] [phone]        Fax   [phone]   History of Present Illness: This pleasant 68 year old woman is seen for a scheduled followup office visit.[name] she has a past history of hypertension and diabetes and hypercholesterolemia. She also has a past history of esophageal varices and a past history of colon cancer. She is a problem with chronic mild anemia and also a problem with thrombocytopenia. She thinks that she may have some gluten intolerance.. She has a history of portal hypertension and esophageal varices secondary to previous chemotherapy and has developed esophageal varices which are being treated endoscopically at [facility]. Since last visit she has been generally doing well. She had a recent colonoscopy which showed some minor inflammation but biopsies were negative and there was no evidence of recurrent colon cancer.  She has occasional spurts of diarrhea which she attributes to IBS  Current Outpatient Prescriptions  Medication Sig Dispense Refill  . ALPRAZolam (XANAX) 0.25 MG tablet Take 1 tablet (0.25 mg total) by mouth at bedtime as needed.  30 tablet  3  . amLODipine (NORVASC) 5 MG tablet Take 1 tablet (5 mg total) by mouth daily.  90 tablet  3  . Cholecalciferol (VITAMIN D PO) Take by mouth daily.        . Coenzyme Q10 (CO Q 10 PO) Take by mouth daily.        . CRESTOR 5 MG tablet Take 1 tablet (5 mg total) by mouth daily.  90 tablet  3  . losartan-hydrochlorothiazide (HYZAAR) 100-12.5 MG per tablet TAKE 1 TABLET DAILY  90 tablet  3  . metFORMIN (GLUCOPHAGE) 500 MG tablet TAKE 2 TABLETS TWICE A DAY  360 tablet  3  . nadolol (CORGARD) 80 MG tablet 1 and 1/2 tablets daily  135 tablet  3  . ondansetron (ZOFRAN-ODT) 4 MG disintegrating tablet TAKE 1 TABLET EVERY 6 HOURS AS NEEDED FOR NAUSEA  30 tablet  0  . ZETIA 10 MG tablet Take 1 tablet (10 mg total) by mouth  daily.  90 tablet  3   No current facility-administered medications for this visit.    Allergies  Allergen Reactions  . Meperidine And Related Nausea And Vomiting  . Contrast Media [Iodinated Diagnostic Agents]     Pt can not have IV dye under any circumstances  . Iohexol     Pt had reaction (hives) even with pre-meds, so patient can not have IV dye for any CT   . Morphine And Related     Patient Active Problem List   Diagnosis Date Noted  . Cold intolerance 08/27/2013  . Unspecified deficiency anemia 03/28/2012  . Colon cancer 03/28/2012  . Diabetes mellitus without mention of complication 13/04/6577  . Benign hypertensive heart disease without heart failure 07/30/2011  . Pure hypercholesterolemia 07/30/2011    History  Smoking status  . Never Smoker   Smokeless tobacco  . Not on file    History  Alcohol Use No    Family History  Problem Relation Age of Onset  . Hypertension Mother   . Cancer Mother   . Diabetes Mother     Review of Systems: Constitutional: no fever chills diaphoresis or fatigue or change in weight.  Head and neck: no hearing loss, no epistaxis, no photophobia or visual disturbance. Respiratory: No cough, shortness of breath or wheezing. Cardiovascular: No  chest pain peripheral edema, palpitations. Gastrointestinal: No abdominal distention, no abdominal pain, no change in bowel habits hematochezia or melena. Genitourinary: No dysuria, no frequency, no urgency, no nocturia. Musculoskeletal:No arthralgias, no back pain, no gait disturbance or myalgias. Neurological: No dizziness, no headaches, no numbness, no seizures, no syncope, no weakness, no tremors. Hematologic: No lymphadenopathy, no easy bruising. Psychiatric: No confusion, no hallucinations, no sleep disturbance.    Physical Exam: Filed Vitals:   03/07/14 1116  BP: 120/70  Pulse: 60   the general appearance reveals a well-developed well-nourished woman in no distress.The head and  neck exam reveals pupils equal and reactive.  Extraocular movements are full.  There is no scleral icterus.  The mouth and pharynx are normal.  The neck is supple.  The carotids reveal no bruits.  The jugular venous pressure is normal.  The  thyroid is not enlarged.  There is no lymphadenopathy.  The chest is clear to percussion and auscultation.  There are no rales or rhonchi.  Expansion of the chest is symmetrical.  The precordium is quiet.  The first heart sound is normal.  The second heart sound is physiologically split.  There is no murmur gallop rub or click.  There is no abnormal lift or heave.  The abdomen is soft and nontender.  The bowel sounds are normal.  The liver and spleen are not enlarged.  There are no abdominal masses.  There are no abdominal bruits.  Extremities reveal good pedal pulses.  There is no phlebitis or edema.  There is no cyanosis or clubbing.  Strength is normal and symmetrical in all extremities.  There is no lateralizing weakness.  There are no sensory deficits.  The skin is warm and dry.  There is no rash.     Assessment / Plan: 1. essential hypertension without heart failure 2. Dyslipidemia 3. diabetes mellitus type II 4.  Past history of colon cancer 5. history of esophageal varices secondary to chemotherapy for colon cancer  Plan: Continue same medication.  We will be checking fasting lab work.  Consider reduction or stopping ezetimibe if her numbers are satisfactory.  She would like to avoid the costly ezetimibe if possible.  Recheck in 6 months for followup office visit lipid panel hepatic function panel basal metabolic panel TSH and I7P

## 2014-03-07 NOTE — Assessment & Plan Note (Signed)
The patient has not been experiencing any hypoglycemic episodes.

## 2014-03-07 NOTE — Assessment & Plan Note (Signed)
Currently the patient is on both low-dose Crestor and an ezetimibe.  She wonders if she needs both.  We will check lab work and consider stopping ezetimibe if her numbers are satisfactory.  She is able to tolerate low dose statins but not high dose statins.

## 2014-03-11 ENCOUNTER — Other Ambulatory Visit: Payer: Medicare Other

## 2014-03-12 ENCOUNTER — Other Ambulatory Visit (INDEPENDENT_AMBULATORY_CARE_PROVIDER_SITE_OTHER): Payer: Medicare Other | Admitting: *Deleted

## 2014-03-12 DIAGNOSIS — E7800 Pure hypercholesterolemia, unspecified: Secondary | ICD-10-CM | POA: Diagnosis not present

## 2014-03-12 DIAGNOSIS — I119 Hypertensive heart disease without heart failure: Secondary | ICD-10-CM

## 2014-03-12 DIAGNOSIS — E119 Type 2 diabetes mellitus without complications: Secondary | ICD-10-CM | POA: Diagnosis not present

## 2014-03-12 LAB — CBC WITH DIFFERENTIAL/PLATELET
BASOS PCT: 0.8 % (ref 0.0–3.0)
Basophils Absolute: 0 10*3/uL (ref 0.0–0.1)
EOS ABS: 0.2 10*3/uL (ref 0.0–0.7)
Eosinophils Relative: 4.5 % (ref 0.0–5.0)
HCT: 36.7 % (ref 36.0–46.0)
HEMOGLOBIN: 12.3 g/dL (ref 12.0–15.0)
LYMPHS PCT: 16.7 % (ref 12.0–46.0)
Lymphs Abs: 0.8 10*3/uL (ref 0.7–4.0)
MCHC: 33.5 g/dL (ref 30.0–36.0)
MCV: 91.8 fl (ref 78.0–100.0)
Monocytes Absolute: 0.4 10*3/uL (ref 0.1–1.0)
Monocytes Relative: 9.3 % (ref 3.0–12.0)
NEUTROS ABS: 3.3 10*3/uL (ref 1.4–7.7)
NEUTROS PCT: 68.7 % (ref 43.0–77.0)
Platelets: 100 10*3/uL — ABNORMAL LOW (ref 150.0–400.0)
RBC: 3.99 Mil/uL (ref 3.87–5.11)
RDW: 14 % (ref 11.5–15.5)
WBC: 4.8 10*3/uL (ref 4.0–10.5)

## 2014-03-12 LAB — HEPATIC FUNCTION PANEL
ALBUMIN: 4.2 g/dL (ref 3.5–5.2)
ALT: 33 U/L (ref 0–35)
AST: 31 U/L (ref 0–37)
Alkaline Phosphatase: 62 U/L (ref 39–117)
BILIRUBIN DIRECT: 0.3 mg/dL (ref 0.0–0.3)
TOTAL PROTEIN: 7.1 g/dL (ref 6.0–8.3)
Total Bilirubin: 1.5 mg/dL — ABNORMAL HIGH (ref 0.2–1.2)

## 2014-03-12 LAB — LIPID PANEL
Cholesterol: 206 mg/dL — ABNORMAL HIGH (ref 0–200)
HDL: 55.9 mg/dL (ref >39.00)
LDL Cholesterol: 139 mg/dL — ABNORMAL HIGH (ref 0–99)
NonHDL: 150.1
TRIGLYCERIDES: 56 mg/dL (ref 0.0–149.0)
Total CHOL/HDL Ratio: 4
VLDL: 11.2 mg/dL (ref 0.0–40.0)

## 2014-03-12 LAB — BASIC METABOLIC PANEL
BUN: 12 mg/dL (ref 6–23)
CO2: 29 meq/L (ref 19–32)
CREATININE: 0.5 mg/dL (ref 0.4–1.2)
Calcium: 9.6 mg/dL (ref 8.4–10.5)
Chloride: 100 mEq/L (ref 96–112)
GFR: 119.36 mL/min (ref >60.00)
Glucose, Bld: 115 mg/dL — ABNORMAL HIGH (ref 70–99)
Potassium: 3.7 mEq/L (ref 3.5–5.1)
Sodium: 137 mEq/L (ref 135–145)

## 2014-03-12 NOTE — Progress Notes (Signed)
Quick Note:  Please report to patient. The recent labs are stable. Continue same medication and careful diet. The LDL is still too high so we need to continue the zetia. ______

## 2014-03-13 ENCOUNTER — Telehealth: Payer: Self-pay | Admitting: *Deleted

## 2014-03-13 MED ORDER — ZETIA 10 MG PO TABS: 10.0000 mg | ORAL_TABLET | Freq: Every day | ORAL | Status: DC

## 2014-03-13 NOTE — Telephone Encounter (Signed)
Advised patient of lab results and sent Rx for Zetia

## 2014-03-13 NOTE — Telephone Encounter (Signed)
Message copied by Earvin Hansen on Wed Mar 13, 2014  9:20 AM ------      Message from: Darlin Coco      Created: Tue Mar 12, 2014 10:55 PM       Please report to patient.  The recent labs are stable. Continue same medication and careful diet. The LDL is still too high so we need to continue the zetia. ------

## 2014-04-03 ENCOUNTER — Ambulatory Visit (INDEPENDENT_AMBULATORY_CARE_PROVIDER_SITE_OTHER): Payer: Medicare Other | Admitting: Family Medicine

## 2014-04-03 VITALS — BP 118/65 | HR 66 | Temp 98.3°F | Resp 16 | Ht 61.0 in | Wt 143.6 lb

## 2014-04-03 DIAGNOSIS — E119 Type 2 diabetes mellitus without complications: Secondary | ICD-10-CM

## 2014-04-03 DIAGNOSIS — R7401 Elevation of levels of liver transaminase levels: Secondary | ICD-10-CM

## 2014-04-03 DIAGNOSIS — N10 Acute tubulo-interstitial nephritis: Secondary | ICD-10-CM

## 2014-04-03 DIAGNOSIS — R509 Fever, unspecified: Secondary | ICD-10-CM | POA: Diagnosis not present

## 2014-04-03 DIAGNOSIS — R740 Nonspecific elevation of levels of transaminase and lactic acid dehydrogenase [LDH]: Secondary | ICD-10-CM

## 2014-04-03 DIAGNOSIS — R7402 Elevation of levels of lactic acid dehydrogenase (LDH): Secondary | ICD-10-CM

## 2014-04-03 LAB — POCT URINALYSIS DIPSTICK
BILIRUBIN UA: NEGATIVE
GLUCOSE UA: NEGATIVE
Ketones, UA: NEGATIVE
Nitrite, UA: POSITIVE
Protein, UA: NEGATIVE
SPEC GRAV UA: 1.01
Urobilinogen, UA: >=8
pH, UA: 6

## 2014-04-03 LAB — POCT GLYCOSYLATED HEMOGLOBIN (HGB A1C): Hemoglobin A1C: 5.9

## 2014-04-03 LAB — POCT UA - MICROSCOPIC ONLY
Casts, Ur, LPF, POC: NEGATIVE
Crystals, Ur, HPF, POC: NEGATIVE
Mucus, UA: NEGATIVE
Yeast, UA: NEGATIVE

## 2014-04-03 LAB — COMPREHENSIVE METABOLIC PANEL
ALT: 43 U/L — ABNORMAL HIGH (ref 0–35)
AST: 51 U/L — ABNORMAL HIGH (ref 0–37)
Albumin: 4 g/dL (ref 3.5–5.2)
Alkaline Phosphatase: 90 U/L (ref 39–117)
BILIRUBIN TOTAL: 2.5 mg/dL — AB (ref 0.2–1.2)
BUN: 11 mg/dL (ref 6–23)
CALCIUM: 8.8 mg/dL (ref 8.4–10.5)
CHLORIDE: 98 meq/L (ref 96–112)
CO2: 25 meq/L (ref 19–32)
Creat: 0.59 mg/dL (ref 0.50–1.10)
Glucose, Bld: 137 mg/dL — ABNORMAL HIGH (ref 70–99)
Potassium: 4 mEq/L (ref 3.5–5.3)
Sodium: 134 mEq/L — ABNORMAL LOW (ref 135–145)
Total Protein: 6.7 g/dL (ref 6.0–8.3)

## 2014-04-03 LAB — GLUCOSE, POCT (MANUAL RESULT ENTRY): POC Glucose: 143 mg/dl — AB (ref 70–99)

## 2014-04-03 MED ORDER — LEVOFLOXACIN 500 MG PO TABS: 500.0000 mg | ORAL_TABLET | Freq: Every day | ORAL | Status: DC

## 2014-04-03 MED ORDER — CEFTRIAXONE SODIUM 1 G IJ SOLR
1.0000 g | Freq: Once | INTRAMUSCULAR | Status: AC
  Administered 2014-04-03: 1 g via INTRAMUSCULAR

## 2014-04-03 MED ORDER — ACETAMINOPHEN 325 MG PO TABS
1000.0000 mg | ORAL_TABLET | Freq: Once | ORAL | Status: AC
  Administered 2014-04-03: 975 mg via ORAL

## 2014-04-03 NOTE — Patient Instructions (Signed)
We hope that you feel better soon- let us know if you do not.  Try to drink fluids and eat some bland foods.  We are going to have you use levaquin daily for your pyelonephritis for 10 days. If you are not improved in 24 hours or so please come back or go to the ER, or give me a call.  I will let you know when I learn any more about your urine cultures and other labs.    Your diabetes control looks fine.

## 2014-04-03 NOTE — Progress Notes (Addendum)
[facility] [address], [place] [address] [phone]  Date:  04/03/2014   Name:  Amber Crosby   DOB:  10/10/1945   MRN:  [id number]  PCP:  No primary provider on file.    Chief Complaint: weak and achy, Chills and Dysuria   History of Present Illness:  Amber Crosby is a 68 y.o. very pleasant female patient who presents with the following:  Here today as a new pt,  She has been ill for about one week  Sx wax and wane.  She has noted some "spells of uncontrolled diarrhea" but not for a couple of days.   She has some esophageal varices.   She has noted some urinary sx for 1-2 weeks.   She has noted urinary frequency and dysuria.  Her back hurts- she hurts all over.   She has not eaten "in 3 days."    She has had just a little to drink.  She has felt chilled, but her home temperature has been ok  Patient Active Problem List   Diagnosis Date Noted  . Cold intolerance 08/27/2013  . Unspecified deficiency anemia 03/28/2012  . Colon cancer 03/28/2012  . Type II or unspecified type diabetes mellitus without mention of complication, uncontrolled 07/30/2011  . Benign hypertensive heart disease without heart failure 07/30/2011  . Pure hypercholesterolemia 07/30/2011    Past Medical History  Diagnosis Date  . Essential hypertension   . Hypercholesterolemia   . Diabetes mellitus     Type 2  . Anxiety   . Ear infection   . Hemorrhoids     Internal-external small hemorrhoids  . Colon cancer     Without evidence of recurrence    Past Surgical History  Procedure Laterality Date  . Vaginal hysterectomy  1979  . Esophagogastroduodenoscopy   08/04/2010    History  Substance Use Topics  . Smoking status: Never Smoker   . Smokeless tobacco: Not on file  . Alcohol Use: No    Family History  Problem Relation Age of Onset  . Hypertension Mother   . Cancer Mother   . Diabetes Mother     Allergies  Allergen Reactions  . Meperidine And Related  Nausea And Vomiting  . Contrast Media [Iodinated Diagnostic Agents]     Pt can not have IV dye under any circumstances  . Iohexol     Pt had reaction (hives) even with pre-meds, so patient can not have IV dye for any CT   . Morphine And Related     Medication list has been reviewed and updated.  Current Outpatient Prescriptions on File Prior to Visit  Medication Sig Dispense Refill  . ALPRAZolam (XANAX) 0.25 MG tablet Take 1 tablet (0.25 mg total) by mouth at bedtime as needed.  30 tablet  3  . amLODipine (NORVASC) 5 MG tablet Take 1 tablet (5 mg total) by mouth daily.  90 tablet  3  . Cholecalciferol (VITAMIN D PO) Take by mouth daily.        . Coenzyme Q10 (CO Q 10 PO) Take by mouth daily.        . CRESTOR 5 MG tablet Take 1 tablet (5 mg total) by mouth daily.  90 tablet  3  . losartan-hydrochlorothiazide (HYZAAR) 100-12.5 MG per tablet TAKE 1 TABLET DAILY  90 tablet  3  . metFORMIN (GLUCOPHAGE) 500 MG tablet TAKE 2 TABLETS TWICE A DAY  360 tablet  3  . nadolol (CORGARD)  80 MG tablet 1 and 1/2 tablets daily  135 tablet  3  . ondansetron (ZOFRAN-ODT) 4 MG disintegrating tablet TAKE 1 TABLET EVERY 6 HOURS AS NEEDED FOR NAUSEA  30 tablet  0  . ZETIA 10 MG tablet Take 1 tablet (10 mg total) by mouth daily.  90 tablet  3   No current facility-administered medications on file prior to visit.    Review of Systems:  As per HPI- otherwise negative.   Physical Examination: Filed Vitals:   04/03/14 1233  BP: 130/68  Pulse: 79  Temp: 102.2 F (39 C)  Resp: 16   Filed Vitals:   04/03/14 1233  Height: 5\' 1"  (1.549 m)  Weight: 143 lb 9.6 oz (65.137 kg)   Body mass index is 27.15 kg/(m^2). Ideal Body Weight: Weight in (lb) to have BMI = 25: 132  GEN: WDWN, NAD, Non-toxic, A & O x 3 HEENT: Atraumatic, Normocephalic. Neck supple. No masses, No LAD. Ears and Nose: No external deformity. CV: RRR, No M/G/R. No JVD. No thrill. No extra heart sounds. PULM: CTA B, no wheezes, crackles,  rhonchi. No retractions. No resp. distress. No accessory muscle use. ABD: S, NT, ND, +BS. No rebound. No HSM. EXTR: No c/c/e NEURO Normal gait.  PSYCH: Normally interactive. Conversant. Not depressed or anxious appearing.  Calm demeanor.   Results for orders placed in visit on 04/03/14  POCT URINALYSIS DIPSTICK      Result Value Ref Range   Color, UA yellow     Clarity, UA cloudy     Glucose, UA neg     Bilirubin, UA neg     Ketones, UA neg     Spec Grav, UA 1.010     Blood, UA moderate     pH, UA 6.0     Protein, UA neg     Urobilinogen, UA >=8.0     Nitrite, UA positive     Leukocytes, UA moderate (2+)    POCT GLYCOSYLATED HEMOGLOBIN (HGB A1C)      Result Value Ref Range   Hemoglobin A1C 5.9    GLUCOSE, POCT (MANUAL RESULT ENTRY)      Result Value Ref Range   POC Glucose 143 (*) 70 - 99 mg/dl  POCT UA - MICROSCOPIC ONLY      Result Value Ref Range   WBC, Ur, HPF, POC tntc     RBC, urine, microscopic tntc     Bacteria, U Microscopic 4+     Mucus, UA neg     Epithelial cells, urine per micros 1-3     Crystals, Ur, HPF, POC neg     Casts, Ur, LPF, POC neg     Yeast, UA neg     Assessment and Plan: Fever, unspecified - Plan: POCT urinalysis dipstick, Urine culture, Comprehensive metabolic panel, POCT UA - Microscopic Only, acetaminophen (TYLENOL) tablet 975 mg, CANCELED: POCT urine pregnancy  Type II or unspecified type diabetes mellitus without mention of complication, not stated as uncontrolled - Plan: POCT glycosylated hemoglobin (Hb A1C), POCT glucose (manual entry)  Acute pyelonephritis - Plan: cefTRIAXone (ROCEPHIN) injection 1 g, levofloxacin (LEVAQUIN) 500 MG tablet  likely acute pyelo.  Treated with 1 L of IV saline, rocephin IM. Start start daily levaquin for 10 days.  She felt better and was able to DC home.  Close follow-up if not continuing to improve.  Await her other labs  [name] would like a copy of her labs as well Signed Lamar Blinks, MD  Called  to check on her 7/17- urine culture showed e coli sensitive to levaquin.  She is better ,but having some mild diarrhea.  States this is not anything unusual for her as she has a lot of issues with her GI system at baseline.  At this point she would like to continue the [facility] but will let us know if her diarrhea becomes severe

## 2014-04-04 NOTE — Telephone Encounter (Signed)
Close encounter 

## 2014-04-05 ENCOUNTER — Encounter: Payer: Self-pay | Admitting: Family Medicine

## 2014-04-05 LAB — URINE CULTURE

## 2014-04-05 NOTE — Addendum Note (Signed)
Addended by: Lamar Blinks C on: 04/05/2014 05:31 PM   Modules accepted: Orders

## 2014-04-06 NOTE — Addendum Note (Signed)
Addended by: Lamar Blinks C on: 04/06/2014 06:13 AM   Modules accepted: Level of Service

## 2014-04-08 ENCOUNTER — Telehealth: Payer: Self-pay

## 2014-04-08 NOTE — Telephone Encounter (Signed)
Patient was prescribed antibiotics by our office and states she has developed a yeast infection. Request medication to be called in to [facility]. Patient's contact number is [phone]

## 2014-04-09 ENCOUNTER — Other Ambulatory Visit: Payer: Self-pay | Admitting: Dermatology

## 2014-04-09 DIAGNOSIS — C44621 Squamous cell carcinoma of skin of unspecified upper limb, including shoulder: Secondary | ICD-10-CM | POA: Diagnosis not present

## 2014-04-09 DIAGNOSIS — D485 Neoplasm of uncertain behavior of skin: Secondary | ICD-10-CM | POA: Diagnosis not present

## 2014-04-09 MED ORDER — FLUCONAZOLE 150 MG PO TABS: 150.0000 mg | ORAL_TABLET | Freq: Once | ORAL | Status: DC

## 2014-04-09 NOTE — Telephone Encounter (Signed)
Sent in Diflucan to pt pharmacy. LM notify pt.

## 2014-04-12 ENCOUNTER — Other Ambulatory Visit: Payer: Self-pay | Admitting: Cardiology

## 2014-04-20 ENCOUNTER — Ambulatory Visit (INDEPENDENT_AMBULATORY_CARE_PROVIDER_SITE_OTHER): Payer: Medicare Other | Admitting: Family Medicine

## 2014-04-20 VITALS — BP 108/62 | HR 67 | Temp 98.3°F | Resp 16 | Ht 61.0 in | Wt 139.0 lb

## 2014-04-20 DIAGNOSIS — R509 Fever, unspecified: Secondary | ICD-10-CM | POA: Diagnosis not present

## 2014-04-20 DIAGNOSIS — D696 Thrombocytopenia, unspecified: Secondary | ICD-10-CM

## 2014-04-20 DIAGNOSIS — K766 Portal hypertension: Secondary | ICD-10-CM | POA: Diagnosis not present

## 2014-04-20 DIAGNOSIS — N10 Acute tubulo-interstitial nephritis: Secondary | ICD-10-CM

## 2014-04-20 DIAGNOSIS — I8510 Secondary esophageal varices without bleeding: Secondary | ICD-10-CM

## 2014-04-20 DIAGNOSIS — I8500 Esophageal varices without bleeding: Secondary | ICD-10-CM

## 2014-04-20 LAB — POCT UA - MICROSCOPIC ONLY
CRYSTALS, UR, HPF, POC: NEGATIVE
Casts, Ur, LPF, POC: NEGATIVE
MUCUS UA: NEGATIVE
YEAST UA: NEGATIVE

## 2014-04-20 LAB — POCT URINALYSIS DIPSTICK
Bilirubin, UA: NEGATIVE
Glucose, UA: NEGATIVE
Ketones, UA: NEGATIVE
Nitrite, UA: NEGATIVE
PH UA: 6
SPEC GRAV UA: 1.01
UROBILINOGEN UA: 0.2

## 2014-04-20 LAB — POCT CBC
Granulocyte percent: 84.6 %G — AB (ref 37–80)
HEMATOCRIT: 37.7 % (ref 37.7–47.9)
Hemoglobin: 12.7 g/dL (ref 12.2–16.2)
Lymph, poc: 0.8 (ref 0.6–3.4)
MCH, POC: 30.3 pg (ref 27–31.2)
MCHC: 33.6 g/dL (ref 31.8–35.4)
MCV: 90.2 fL (ref 80–97)
MID (cbc): 0.6 (ref 0–0.9)
MPV: 7.4 fL (ref 0–99.8)
POC Granulocyte: 7.5 — AB (ref 2–6.9)
POC LYMPH PERCENT: 8.7 %L — AB (ref 10–50)
POC MID %: 6.7 %M (ref 0–12)
Platelet Count, POC: 81 10*3/uL — AB (ref 142–424)
RBC: 4.18 M/uL (ref 4.04–5.48)
RDW, POC: 13.7 %
WBC: 8.9 10*3/uL (ref 4.6–10.2)

## 2014-04-20 LAB — COMPLETE METABOLIC PANEL WITH GFR
ALBUMIN: 4 g/dL (ref 3.5–5.2)
ALT: 25 U/L (ref 0–35)
AST: 29 U/L (ref 0–37)
Alkaline Phosphatase: 62 U/L (ref 39–117)
BUN: 9 mg/dL (ref 6–23)
CHLORIDE: 101 meq/L (ref 96–112)
CO2: 29 mEq/L (ref 19–32)
Calcium: 9.1 mg/dL (ref 8.4–10.5)
Creat: 0.61 mg/dL (ref 0.50–1.10)
GLUCOSE: 102 mg/dL — AB (ref 70–99)
POTASSIUM: 4.1 meq/L (ref 3.5–5.3)
Sodium: 139 mEq/L (ref 135–145)
Total Bilirubin: 2.3 mg/dL — ABNORMAL HIGH (ref 0.2–1.2)
Total Protein: 6.5 g/dL (ref 6.0–8.3)

## 2014-04-20 MED ORDER — FLUCONAZOLE 150 MG PO TABS: 150.0000 mg | ORAL_TABLET | Freq: Once | ORAL | Status: DC

## 2014-04-20 MED ORDER — AMOXICILLIN-POT CLAVULANATE 875-125 MG PO TABS: 1.0000 | ORAL_TABLET | Freq: Two times a day (BID) | ORAL | Status: DC

## 2014-04-20 NOTE — Progress Notes (Signed)
Subjective:    Patient ID: Amber Crosby, female    DOB: 1946/06/02, 68 y.o.   MRN: [id number] This chart was scribed for Shawnee Knapp, MD by Martinique Peace, ED Scribe. The patient was seen in [facility]. The patient's care was started at 11:05 AM.  Chief Complaint  Patient presents with  . Follow-up    body aches and chills last night    HPI HPI Comments: CYTHIA Crosby is a 68 y.o. female who presents to the [facility] following up regarding continuing complaints joint pain and max temperature of 104 with new onset of slight nausea and chills that started yesterday around 5:30 PM. Pt was seen two weeks previously to address an ongoing fever that had lasted a week. Pt was given iv fluids, IM Rocephin and then treated with 10 days of levaquin. Pt's urine culture showed sensativity to 10 days of Levaquin and resulted in a yeast infection. Pt was diagnosed with kidney infection by Dr. Edilia Bo. Pt reports that fever was gone and she was feeling slightly fatigued but overall better until these symptoms returned. Pt had been advised previously that if there was any flare-ups then she was to return here to be checked. Pt reports history of colon re-section in 2006 to address colon-rectum cancer which is the cause of why her bowel movements are never really "normal".  Pt also reports history of portal hypertension as a reaction to the chemotherapeutic agent she was given. Pt states that she has been cancer free since 2006. Pt reports that she did not get a lot of sleep last night and has taken 2 Tylenol and Zofran pta without much relief.   Past Medical History  Diagnosis Date  . Essential hypertension   . Hypercholesterolemia   . Diabetes mellitus     Type 2  . Anxiety   . Ear infection   . Hemorrhoids     Internal-external small hemorrhoids  . Colon cancer     Without evidence of recurrence   Current Outpatient Prescriptions on File Prior to Visit  Medication Sig Dispense Refill  . ALPRAZolam (XANAX) 0.25  MG tablet Take 1 tablet (0.25 mg total) by mouth at bedtime as needed.  30 tablet  3  . amLODipine (NORVASC) 5 MG tablet Take 1 tablet (5 mg total) by mouth daily.  90 tablet  3  . Cholecalciferol (VITAMIN D PO) Take by mouth daily.        . Coenzyme Q10 (CO Q 10 PO) Take by mouth daily.        . CRESTOR 5 MG tablet TAKE 1 TABLET DAILY  90 tablet  1  . fluconazole (DIFLUCAN) 150 MG tablet Take 1 tablet (150 mg total) by mouth once.  1 tablet  1  . losartan-hydrochlorothiazide (HYZAAR) 100-12.5 MG per tablet TAKE 1 TABLET DAILY  90 tablet  3  . metFORMIN (GLUCOPHAGE) 500 MG tablet TAKE 2 TABLETS TWICE A DAY  360 tablet  3  . nadolol (CORGARD) 80 MG tablet 1 and 1/2 tablets daily  135 tablet  3  . ondansetron (ZOFRAN-ODT) 4 MG disintegrating tablet TAKE 1 TABLET EVERY 6 HOURS AS NEEDED FOR NAUSEA  30 tablet  0  . ZETIA 10 MG tablet Take 1 tablet (10 mg total) by mouth daily.  90 tablet  3   No current facility-administered medications on file prior to visit.   Allergies  Allergen Reactions  . Meperidine And Related Nausea And Vomiting  . Contrast Media [Iodinated  Diagnostic Agents]     Pt can not have IV dye under any circumstances  . Iohexol     Pt had reaction (hives) even with pre-meds, so patient can not have IV dye for any CT   . Morphine And Related       Review of Systems  Constitutional: Positive for fever (max: 104), chills, appetite change and fatigue.  HENT: Negative for sinus pressure.   Respiratory: Negative for cough.   Gastrointestinal: Positive for nausea. Negative for abdominal pain.  Genitourinary: Negative for dysuria, urgency, frequency, hematuria, decreased urine volume, vaginal bleeding, vaginal discharge, difficulty urinating, vaginal pain and pelvic pain.  Musculoskeletal:       Joint pain.   Neurological: Negative for headaches.  Psychiatric/Behavioral: Negative for confusion.       Objective:  BP 108/62  Pulse 67  Temp(Src) 98.3 F (36.8 C)  Resp  16  Ht 5\' 1"  (1.549 m)  Wt 139 lb (63.05 kg)  BMI 26.28 kg/m2  SpO2 98%  Physical Exam  Nursing note and vitals reviewed. Constitutional: She is oriented to person, place, and time. She appears well-developed and well-nourished. No distress.  HENT:  Head: Normocephalic and atraumatic.  Eyes: Conjunctivae and EOM are normal.  Neck: Neck supple. No tracheal deviation present.  Cardiovascular: Normal rate, regular rhythm, normal heart sounds and intact distal pulses.   No murmur heard. Pulmonary/Chest: Effort normal. No respiratory distress.  Abdominal: Soft. Bowel sounds are normal. There is tenderness.  Mild suprapubic tenderness.   Musculoskeletal: Normal range of motion. She exhibits tenderness.  MIld CS tenderness.   Neurological: She is alert and oriented to person, place, and time.  Skin: Skin is warm and dry.  Psychiatric: She has a normal mood and affect. Her behavior is normal.    Results for orders placed in visit on 04/20/14  POCT UA - MICROSCOPIC ONLY      Result Value Ref Range   WBC, Ur, HPF, POC 3-5     RBC, urine, microscopic 1-3     Bacteria, U Microscopic trace     Mucus, UA neg     Epithelial cells, urine per micros 2-4     Crystals, Ur, HPF, POC neg     Casts, Ur, LPF, POC neg     Yeast, UA neg    POCT URINALYSIS DIPSTICK      Result Value Ref Range   Color, UA amber     Clarity, UA clear     Glucose, UA neg     Bilirubin, UA neg     Ketones, UA neg     Spec Grav, UA 1.010     Blood, UA trace     pH, UA 6.0     Protein, UA trace     Urobilinogen, UA 0.2     Nitrite, UA neg     Leukocytes, UA Trace    POCT CBC      Result Value Ref Range   WBC 8.9  4.6 - 10.2 K/uL   Lymph, poc 0.8  0.6 - 3.4   POC LYMPH PERCENT 8.7 (*) 10 - 50 %L   MID (cbc) 0.6  0 - 0.9   POC MID % 6.7  0 - 12 %M   POC Granulocyte 7.5 (*) 2 - 6.9   Granulocyte percent 84.6 (*) 37 - 80 %G   RBC 4.18  4.04 - 5.48 M/uL   Hemoglobin 12.7  12.2 - 16.2 g/dL   HCT, POC 37.7  37.7  - 47.9 %   MCV 90.2  80 - 97 fL   MCH, POC 30.3  27 - 31.2 pg   MCHC 33.6  31.8 - 35.4 g/dL   RDW, POC 13.7     Platelet Count, POC 81 (*) 142 - 424 K/uL   MPV 7.4  0 - 99.8 fL      Assessment & Plan:  11:11 AM- Treatment plan was discussed with patient who verbalizes understanding and agrees.   Fever, unspecified - Plan: POCT CBC, COMPLETE METABOLIC PANEL WITH GFR, Urine culture  Acute pyelonephritis - Plan: POCT UA - Microscopic Only, POCT urinalysis dipstick, Urine culture - UA looks benign but will start pt on abx while clx is pending due to severity of sxs and recent pyelo - last clx was sensitive to augmentin.  Thrombocytopenia, unspecified - chronic, not significantly changed from baseline  Portal hypertension with esophageal varices - from prior chemo drugs - being followed at [facility], no colon cancer free  Meds ordered this encounter  Medications  . amoxicillin-clavulanate (AUGMENTIN) 875-125 MG per tablet    Sig: Take 1 tablet by mouth 2 (two) times daily.    Dispense:  20 tablet    Refill:  0  . fluconazole (DIFLUCAN) 150 MG tablet    Sig: Take 1 tablet (150 mg total) by mouth once. May repeat after 3d if needed.    Dispense:  2 tablet    Refill:  1    I personally performed the services described in this documentation, which was scribed in my presence. The recorded information has been reviewed and considered, and addended by me as needed.  Delman Cheadle, MD MPH

## 2014-04-21 ENCOUNTER — Encounter: Payer: Self-pay | Admitting: Family Medicine

## 2014-04-22 LAB — URINE CULTURE

## 2014-05-06 ENCOUNTER — Other Ambulatory Visit: Payer: Self-pay | Admitting: *Deleted

## 2014-05-06 DIAGNOSIS — I119 Hypertensive heart disease without heart failure: Secondary | ICD-10-CM

## 2014-05-06 MED ORDER — LOSARTAN POTASSIUM-HCTZ 100-12.5 MG PO TABS: ORAL_TABLET | ORAL | Status: DC

## 2014-05-06 MED ORDER — AMLODIPINE BESYLATE 5 MG PO TABS: 5.0000 mg | ORAL_TABLET | Freq: Every day | ORAL | Status: DC

## 2014-08-02 DIAGNOSIS — Z23 Encounter for immunization: Secondary | ICD-10-CM | POA: Diagnosis not present

## 2014-09-03 ENCOUNTER — Ambulatory Visit: Payer: Medicare Other | Admitting: Cardiology

## 2014-09-05 DIAGNOSIS — R110 Nausea: Secondary | ICD-10-CM | POA: Diagnosis not present

## 2014-09-05 DIAGNOSIS — A09 Infectious gastroenteritis and colitis, unspecified: Secondary | ICD-10-CM | POA: Diagnosis not present

## 2014-09-16 DIAGNOSIS — M151 Heberden's nodes (with arthropathy): Secondary | ICD-10-CM | POA: Diagnosis not present

## 2014-09-16 DIAGNOSIS — D2111 Benign neoplasm of connective and other soft tissue of right upper limb, including shoulder: Secondary | ICD-10-CM | POA: Diagnosis not present

## 2014-09-17 DIAGNOSIS — M799 Soft tissue disorder, unspecified: Secondary | ICD-10-CM | POA: Diagnosis not present

## 2014-09-18 DIAGNOSIS — D2111 Benign neoplasm of connective and other soft tissue of right upper limb, including shoulder: Secondary | ICD-10-CM | POA: Diagnosis not present

## 2014-09-18 DIAGNOSIS — M151 Heberden's nodes (with arthropathy): Secondary | ICD-10-CM | POA: Diagnosis not present

## 2014-09-24 DIAGNOSIS — K766 Portal hypertension: Secondary | ICD-10-CM | POA: Diagnosis not present

## 2014-09-24 DIAGNOSIS — R112 Nausea with vomiting, unspecified: Secondary | ICD-10-CM | POA: Diagnosis not present

## 2014-09-24 DIAGNOSIS — I8510 Secondary esophageal varices without bleeding: Secondary | ICD-10-CM | POA: Diagnosis not present

## 2014-09-24 DIAGNOSIS — K3189 Other diseases of stomach and duodenum: Secondary | ICD-10-CM | POA: Diagnosis not present

## 2014-09-24 DIAGNOSIS — Z91041 Radiographic dye allergy status: Secondary | ICD-10-CM | POA: Diagnosis not present

## 2014-09-24 DIAGNOSIS — Z885 Allergy status to narcotic agent status: Secondary | ICD-10-CM | POA: Diagnosis not present

## 2014-09-24 DIAGNOSIS — I8500 Esophageal varices without bleeding: Secondary | ICD-10-CM | POA: Diagnosis not present

## 2014-09-24 DIAGNOSIS — E119 Type 2 diabetes mellitus without complications: Secondary | ICD-10-CM | POA: Diagnosis not present

## 2014-09-26 ENCOUNTER — Other Ambulatory Visit: Payer: Self-pay | Admitting: Dermatology

## 2014-09-26 DIAGNOSIS — L309 Dermatitis, unspecified: Secondary | ICD-10-CM | POA: Diagnosis not present

## 2014-09-26 DIAGNOSIS — Z85828 Personal history of other malignant neoplasm of skin: Secondary | ICD-10-CM | POA: Diagnosis not present

## 2014-09-26 DIAGNOSIS — D225 Melanocytic nevi of trunk: Secondary | ICD-10-CM | POA: Diagnosis not present

## 2014-09-26 DIAGNOSIS — L821 Other seborrheic keratosis: Secondary | ICD-10-CM | POA: Diagnosis not present

## 2014-09-26 DIAGNOSIS — D485 Neoplasm of uncertain behavior of skin: Secondary | ICD-10-CM | POA: Diagnosis not present

## 2014-09-30 ENCOUNTER — Other Ambulatory Visit: Payer: Self-pay | Admitting: *Deleted

## 2014-09-30 DIAGNOSIS — I119 Hypertensive heart disease without heart failure: Secondary | ICD-10-CM

## 2014-09-30 MED ORDER — AMLODIPINE BESYLATE 5 MG PO TABS: 5.0000 mg | ORAL_TABLET | Freq: Every day | ORAL | Status: DC

## 2014-10-14 ENCOUNTER — Other Ambulatory Visit: Payer: Medicare Other

## 2014-10-16 ENCOUNTER — Other Ambulatory Visit (INDEPENDENT_AMBULATORY_CARE_PROVIDER_SITE_OTHER): Payer: Medicare Other | Admitting: *Deleted

## 2014-10-16 DIAGNOSIS — E7800 Pure hypercholesterolemia, unspecified: Secondary | ICD-10-CM

## 2014-10-16 DIAGNOSIS — I119 Hypertensive heart disease without heart failure: Secondary | ICD-10-CM | POA: Diagnosis not present

## 2014-10-16 DIAGNOSIS — E119 Type 2 diabetes mellitus without complications: Secondary | ICD-10-CM

## 2014-10-16 LAB — BASIC METABOLIC PANEL
BUN: 14 mg/dL (ref 6–23)
CO2: 26 mEq/L (ref 19–32)
CREATININE: 0.54 mg/dL (ref 0.40–1.20)
Calcium: 9.6 mg/dL (ref 8.4–10.5)
Chloride: 101 mEq/L (ref 96–112)
GFR: 119.14 mL/min (ref >60.00)
Glucose, Bld: 117 mg/dL — ABNORMAL HIGH (ref 70–99)
POTASSIUM: 4 meq/L (ref 3.5–5.1)
Sodium: 136 mEq/L (ref 135–145)

## 2014-10-16 LAB — LIPID PANEL
CHOL/HDL RATIO: 3
CHOLESTEROL: 149 mg/dL (ref 0–200)
HDL: 55.4 mg/dL (ref >39.00)
LDL Cholesterol: 73 mg/dL (ref 0–99)
NONHDL: 93.6
Triglycerides: 105 mg/dL (ref 0.0–149.0)
VLDL: 21 mg/dL (ref 0.0–40.0)

## 2014-10-16 LAB — HEPATIC FUNCTION PANEL
ALT: 29 U/L (ref 0–35)
AST: 33 U/L (ref 0–37)
Albumin: 4.1 g/dL (ref 3.5–5.2)
Alkaline Phosphatase: 72 U/L (ref 39–117)
Bilirubin, Direct: 0.3 mg/dL (ref 0.0–0.3)
Total Bilirubin: 1.2 mg/dL (ref 0.2–1.2)
Total Protein: 7.1 g/dL (ref 6.0–8.3)

## 2014-10-16 LAB — TSH: TSH: 1.07 u[IU]/mL (ref 0.35–4.50)

## 2014-10-16 NOTE — Addendum Note (Signed)
Addended by: Eulis Foster on: 10/16/2014 09:08 AM   Modules accepted: Orders

## 2014-10-16 NOTE — Progress Notes (Signed)
Quick Note:  Please make copy of labs for patient visit. ______ 

## 2014-10-17 ENCOUNTER — Encounter: Payer: Self-pay | Admitting: Cardiology

## 2014-10-17 ENCOUNTER — Ambulatory Visit (INDEPENDENT_AMBULATORY_CARE_PROVIDER_SITE_OTHER): Payer: Medicare Other | Admitting: Cardiology

## 2014-10-17 VITALS — BP 118/66 | HR 67 | Ht 62.0 in | Wt 141.0 lb

## 2014-10-17 DIAGNOSIS — E7800 Pure hypercholesterolemia, unspecified: Secondary | ICD-10-CM

## 2014-10-17 DIAGNOSIS — K766 Portal hypertension: Secondary | ICD-10-CM | POA: Diagnosis not present

## 2014-10-17 DIAGNOSIS — I8500 Esophageal varices without bleeding: Secondary | ICD-10-CM | POA: Diagnosis not present

## 2014-10-17 MED ORDER — ROSUVASTATIN CALCIUM 5 MG PO TABS: 5.0000 mg | ORAL_TABLET | ORAL | Status: DC

## 2014-10-17 MED ORDER — EZETIMIBE 10 MG PO TABS: 10.0000 mg | ORAL_TABLET | ORAL | Status: DC

## 2014-10-17 NOTE — Progress Notes (Signed)
Cardiology Office Note   Date:  10/17/2014   ID:  Amber, Crosby 1946/03/15, MRN [id number]  PCP:  No PCP Per Patient  Cardiologist:   Darlin Coco, MD   No chief complaint on file.     History of Present Illness: Amber Crosby is a 69 y.o. female who presents for follow-up office visit  This pleasant 69 year old woman is seen for a scheduled followup office visit.Amber Crosby she has a past history of hypertension and diabetes and hypercholesterolemia. She also has a past history of esophageal varices and a past history of colon cancer. She is a problem with chronic mild anemia and also a problem with thrombocytopenia. She thinks that she may have some gluten intolerance.. She has a history of portal hypertension and esophageal varices secondary to previous chemotherapy and has developed esophageal varices which are being treated endoscopically at [facility].  She had a recent endoscopy at [facility].  She did not require any further banding.  Her next endoscopy will be in September 2016.  Since last visit she has been generally doing well.  The patient is now on a gluten-free diet.  She is also on a line.  Her diarrhea has improved on this new regimen.  She is now taking cinnamon 1 capsule daily and attributes her significant improvement in lipid status to this  Past Medical History  Diagnosis Date  . Essential hypertension   . Hypercholesterolemia   . Diabetes mellitus     Type 2  . Anxiety   . Ear infection   . Hemorrhoids     Internal-external small hemorrhoids  . Colon cancer     Without evidence of recurrence    Past Surgical History  Procedure Laterality Date  . Vaginal hysterectomy  1979  . Esophagogastroduodenoscopy   08/04/2010     Current Outpatient Prescriptions  Medication Sig Dispense Refill  . ALPRAZolam (XANAX) 0.25 MG tablet Take 1 tablet (0.25 mg total) by mouth at bedtime as needed. 30 tablet 3  . amLODipine (NORVASC) 5 MG tablet Take 1 tablet (5 mg  total) by mouth daily. 90 tablet 0  . amoxicillin-clavulanate (AUGMENTIN) 875-125 MG per tablet Take 1 tablet by mouth 2 (two) times daily. 20 tablet 0  . Cholecalciferol (VITAMIN D PO) Take by mouth daily.      . Coenzyme Q10 (CO Q 10 PO) Take by mouth daily.      . CRESTOR 5 MG tablet TAKE 1 TABLET DAILY 90 tablet 1  . losartan-hydrochlorothiazide (HYZAAR) 100-12.5 MG per tablet TAKE 1 TABLET DAILY 90 tablet 1  . metFORMIN (GLUCOPHAGE) 500 MG tablet TAKE 2 TABLETS TWICE A DAY 360 tablet 3  . nadolol (CORGARD) 80 MG tablet 1 and 1/2 tablets daily 135 tablet 3  . ondansetron (ZOFRAN-ODT) 4 MG disintegrating tablet TAKE 1 TABLET EVERY 6 HOURS AS NEEDED FOR NAUSEA 30 tablet 0  . Probiotic Product (ALIGN PO) Take by mouth daily.    Amber Crosby ZETIA 10 MG tablet Take 1 tablet (10 mg total) by mouth daily. 90 tablet 3   No current facility-administered medications for this visit.    Allergies:   Meperidine and related; Contrast media; Iohexol; and Morphine and related    Social History:  The patient  reports that she has never smoked. She does not have any smokeless tobacco history on file. She reports that she does not drink alcohol or use illicit drugs.   Family History:  The patient's family history includes Cancer in  her mother; Diabetes in her mother; Hypertension in her mother.    ROS:  Please see the history of present illness.   Otherwise, review of systems are positive for none.   All other systems are reviewed and negative.    PHYSICAL EXAM: VS:  BP 118/66 mmHg  Pulse 67  Ht 5\' 2"  (1.575 m)  Wt 141 lb (63.957 kg)  BMI 25.78 kg/m2 , BMI Body mass index is 25.78 kg/(m^2). GEN: Well nourished, well developed, in no acute distress HEENT: normal Neck: no JVD, carotid bruits, or masses Cardiac: RRR; no murmurs, rubs, or gallops,no edema  Respiratory:  clear to auscultation bilaterally, normal work of breathing GI: soft, nontender, nondistended, + BS MS: no deformity or atrophy Skin: warm  and dry, no rash Neuro:  Strength and sensation are intact Psych: euthymic mood, full affect   EKG:  EKG is ordered today. The ekg ordered today demonstrates normal sinus rhythm.  No ischemic changes.  No change since 08/27/13   Recent Labs: 03/12/2014: Platelets 100.0* 04/20/2014: Hemoglobin 12.7 10/16/2014: ALT 29; BUN 14; Creatinine 0.54; Potassium 4.0; Sodium 136; TSH 1.07    Lipid Panel    Component Value Date/Time   CHOL 149 10/16/2014 0908   TRIG 105.0 10/16/2014 0908   HDL 55.40 10/16/2014 0908   CHOLHDL 3 10/16/2014 0908   VLDL 21.0 10/16/2014 0908   LDLCALC 73 10/16/2014 0908      Wt Readings from Last 3 Encounters:  10/17/14 141 lb (63.957 kg)  04/20/14 139 lb (63.05 kg)  04/03/14 143 lb 9.6 oz (65.137 kg)      Other studies Reviewed:   ASSESSMENT AND PLAN:  1. essential hypertension without heart failure 2. Dyslipidemia 3. diabetes mellitus type II 4. Past history of colon cancer 5. history of esophageal varices secondary to chemotherapy for colon cancer 6.  Diarrhea, improving on gluten-free diet and Align.    Current medicines are reviewed at length with the patient today.  The patient does not have concerns regarding medicines.  The following changes have been made:  We are going to reduce Zetia  to every other day and her Crestor to every other day on alternate days.  She will continue taking her cinnamon capsule daily.  We will recheck her in 6 months and check labs prior to next visit  Labs/ tests ordered today include:  No orders of the defined types were placed in this encounter.     Disposition:   FU with [name] in 6 months  OV and Labs.  Signed, Darlin Coco, MD  10/17/2014 4:26 PM    [facility] [address], [place], [place]  [address] Phone: [phone]; Fax: [phone]

## 2014-10-17 NOTE — Patient Instructions (Signed)
DECREASE ZETIA TO EVERY OTHER DAY  DECREASE CRESTOR TO EVERY OTHER DAY  Your physician wants you to follow-up in: 6 months with fasting labs (lp/bmet/hfp)  You will receive a reminder letter in the mail two months in advance. If you don't receive a letter, please call our office to schedule the follow-up appointment.

## 2014-10-20 ENCOUNTER — Other Ambulatory Visit: Payer: Self-pay | Admitting: Cardiology

## 2014-10-21 DIAGNOSIS — F419 Anxiety disorder, unspecified: Secondary | ICD-10-CM | POA: Diagnosis not present

## 2014-10-21 DIAGNOSIS — Z23 Encounter for immunization: Secondary | ICD-10-CM | POA: Diagnosis not present

## 2014-10-21 DIAGNOSIS — R109 Unspecified abdominal pain: Secondary | ICD-10-CM | POA: Diagnosis not present

## 2014-10-21 DIAGNOSIS — I8500 Esophageal varices without bleeding: Secondary | ICD-10-CM | POA: Diagnosis not present

## 2014-10-21 DIAGNOSIS — Z780 Asymptomatic menopausal state: Secondary | ICD-10-CM | POA: Diagnosis not present

## 2014-10-21 DIAGNOSIS — E119 Type 2 diabetes mellitus without complications: Secondary | ICD-10-CM | POA: Diagnosis not present

## 2014-10-21 DIAGNOSIS — Z85038 Personal history of other malignant neoplasm of large intestine: Secondary | ICD-10-CM | POA: Diagnosis not present

## 2014-10-21 DIAGNOSIS — E780 Pure hypercholesterolemia: Secondary | ICD-10-CM | POA: Diagnosis not present

## 2014-10-21 DIAGNOSIS — I10 Essential (primary) hypertension: Secondary | ICD-10-CM | POA: Diagnosis not present

## 2014-10-31 DIAGNOSIS — Z1231 Encounter for screening mammogram for malignant neoplasm of breast: Secondary | ICD-10-CM | POA: Diagnosis not present

## 2014-10-31 DIAGNOSIS — M8580 Other specified disorders of bone density and structure, unspecified site: Secondary | ICD-10-CM | POA: Diagnosis not present

## 2014-11-18 ENCOUNTER — Encounter: Payer: Self-pay | Admitting: Cardiology

## 2014-11-25 ENCOUNTER — Encounter: Payer: Self-pay | Admitting: Cardiology

## 2014-12-03 DIAGNOSIS — E119 Type 2 diabetes mellitus without complications: Secondary | ICD-10-CM | POA: Diagnosis not present

## 2014-12-03 DIAGNOSIS — H40012 Open angle with borderline findings, low risk, left eye: Secondary | ICD-10-CM | POA: Diagnosis not present

## 2014-12-03 DIAGNOSIS — H5203 Hypermetropia, bilateral: Secondary | ICD-10-CM | POA: Diagnosis not present

## 2015-02-16 ENCOUNTER — Other Ambulatory Visit: Payer: Self-pay | Admitting: Cardiology

## 2015-03-29 ENCOUNTER — Other Ambulatory Visit: Payer: Self-pay | Admitting: Cardiology

## 2015-04-10 ENCOUNTER — Other Ambulatory Visit (INDEPENDENT_AMBULATORY_CARE_PROVIDER_SITE_OTHER): Payer: Medicare Other | Admitting: *Deleted

## 2015-04-10 DIAGNOSIS — E7800 Pure hypercholesterolemia, unspecified: Secondary | ICD-10-CM

## 2015-04-10 LAB — LIPID PANEL
CHOL/HDL RATIO: 3
Cholesterol: 197 mg/dL (ref 0–200)
HDL: 57.4 mg/dL (ref >39.00)
LDL Cholesterol: 126 mg/dL — ABNORMAL HIGH (ref 0–99)
NonHDL: 139.6
Triglycerides: 68 mg/dL (ref 0.0–149.0)
VLDL: 13.6 mg/dL (ref 0.0–40.0)

## 2015-04-10 LAB — BASIC METABOLIC PANEL
BUN: 17 mg/dL (ref 6–23)
CO2: 29 mEq/L (ref 19–32)
CREATININE: 0.57 mg/dL (ref 0.40–1.20)
Calcium: 9.5 mg/dL (ref 8.4–10.5)
Chloride: 102 mEq/L (ref 96–112)
GFR: 111.78 mL/min (ref >60.00)
GLUCOSE: 106 mg/dL — AB (ref 70–99)
POTASSIUM: 3.8 meq/L (ref 3.5–5.1)
SODIUM: 138 meq/L (ref 135–145)

## 2015-04-10 LAB — HEPATIC FUNCTION PANEL
ALBUMIN: 3.8 g/dL (ref 3.5–5.2)
ALK PHOS: 68 U/L (ref 39–117)
ALT: 28 U/L (ref 0–35)
AST: 26 U/L (ref 0–37)
BILIRUBIN TOTAL: 1.4 mg/dL — AB (ref 0.2–1.2)
Bilirubin, Direct: 0.2 mg/dL (ref 0.0–0.3)
TOTAL PROTEIN: 6.7 g/dL (ref 6.0–8.3)

## 2015-04-10 NOTE — Progress Notes (Signed)
Quick Note:  Please make copy of labs for patient visit. ______ 

## 2015-04-16 ENCOUNTER — Ambulatory Visit (INDEPENDENT_AMBULATORY_CARE_PROVIDER_SITE_OTHER): Payer: Medicare Other | Admitting: Cardiology

## 2015-04-16 ENCOUNTER — Encounter: Payer: Self-pay | Admitting: Cardiology

## 2015-04-16 VITALS — BP 140/76 | HR 64 | Ht 62.0 in | Wt 146.8 lb

## 2015-04-16 DIAGNOSIS — I119 Hypertensive heart disease without heart failure: Secondary | ICD-10-CM

## 2015-04-16 DIAGNOSIS — E7800 Pure hypercholesterolemia, unspecified: Secondary | ICD-10-CM

## 2015-04-16 DIAGNOSIS — E119 Type 2 diabetes mellitus without complications: Secondary | ICD-10-CM

## 2015-04-16 NOTE — Progress Notes (Signed)
Cardiology Office Note   Date:  04/16/2015   ID:  Amber, Crosby 05-10-1946, MRN [id number]  PCP:  No PCP Per Patient  Cardiologist: Darlin Coco MD  No chief complaint on file.     History of Present Illness: Amber Crosby is a 69 y.o. female who presents for a scheduled follow-up visit. This pleasant 69 year old woman is seen for a scheduled followup office visit.[name] she has a past history of hypertension and diabetes and hypercholesterolemia. She also has a past history of esophageal varices and a past history of colon cancer. She is a problem with chronic mild anemia and also a problem with thrombocytopenia. She thinks that she may have some gluten intolerance.. She has a history of portal hypertension and esophageal varices secondary to previous chemotherapy and has developed esophageal varices which are being treated endoscopically at [facility]. She had a recent endoscopy at [facility]. She did not require any further banding. Her next endoscopy will be in September 2016. Since last visit she has been generally doing well. The patient is now on a gluten-free diet.Her daughter has gone through a divorce and there has been more stress in the family.  She thinks that this is playing a role in her diarrhea. Since last visit she has not been experiencing any chest pain or shortness of breath.  She is not hearing any peripheral edema.  Her weight is up 5 pounds and she has not been able to exercise as much as usual. She goes back to [facility] on September 6 to see if she needs another banding of her esophageal varices.   Past Medical History  Diagnosis Date  . Essential hypertension   . Hypercholesterolemia   . Diabetes mellitus     Type 2  . Anxiety   . Ear infection   . Hemorrhoids     Internal-external small hemorrhoids  . Colon cancer     Without evidence of recurrence    Past Surgical History  Procedure Laterality Date  . Vaginal hysterectomy  1979  .  Esophagogastroduodenoscopy   08/04/2010     Current Outpatient Prescriptions  Medication Sig Dispense Refill  . ALPRAZolam (XANAX) 0.25 MG tablet Take 0.25 mg by mouth at bedtime as needed for anxiety.    [name] amLODipine (NORVASC) 5 MG tablet Take 1 tablet (5 mg total) by mouth daily. 90 tablet 0  . Cholecalciferol (VITAMIN D PO) Take by mouth daily.      . Coenzyme Q10 (CO Q 10 PO) Take by mouth daily.      [name] ezetimibe (ZETIA) 10 MG tablet Take 1 tablet (10 mg total) by mouth every other day. 45 tablet 3  . losartan-hydrochlorothiazide (HYZAAR) 100-12.5 MG per tablet TAKE 1 TABLET DAILY 90 tablet 0  . metFORMIN (GLUCOPHAGE) 500 MG tablet TAKE 2 TABLETS TWICE A DAY 360 tablet 3  . nadolol (CORGARD) 80 MG tablet 1 and 1/2 tablets daily 135 tablet 3  . ondansetron (ZOFRAN) 8 MG tablet Take 8 mg by mouth every 8 (eight) hours as needed for nausea or vomiting.    . Probiotic Product (ALIGN PO) Take by mouth daily.    . rosuvastatin (CRESTOR) 5 MG tablet Take 1 tablet (5 mg total) by mouth every other day. 45 tablet 3   No current facility-administered medications for this visit.    Allergies:   Meperidine and related; Contrast media; Iohexol; and Morphine and related    Social History:  The patient  reports that she has never smoked. She does not have any smokeless tobacco history on file. She reports that she does not drink alcohol or use illicit drugs.   Family History:  The patient's family history includes Cancer in her mother; Diabetes in her mother; Hypertension in her mother.    ROS:  Please see the history of present illness.   Otherwise, review of systems are positive for none.   All other systems are reviewed and negative.    PHYSICAL EXAM: VS:  BP 140/76 mmHg  Pulse 64  Ht 5\' 2"  (1.575 m)  Wt 146 lb 12.8 oz (66.588 kg)  BMI 26.84 kg/m2 , BMI Body mass index is 26.84 kg/(m^2). GEN: Well nourished, well developed, in no acute distress HEENT: normal Neck: no JVD, carotid  bruits, or masses Cardiac: RRR; no murmurs, rubs, or gallops,no edema  Respiratory:  clear to auscultation bilaterally, normal work of breathing GI: soft, nontender, nondistended, + BS MS: no deformity or atrophy Skin: warm and dry, no rash Neuro:  Strength and sensation are intact Psych: euthymic mood, full affect   EKG:  EKG is not ordered today.    Recent Labs: 04/20/2014: Hemoglobin 12.7 10/16/2014: TSH 1.07 04/10/2015: ALT 28; BUN 17; Creatinine, Ser 0.57; Potassium 3.8; Sodium 138    Lipid Panel    Component Value Date/Time   CHOL 197 04/10/2015 0734   TRIG 68.0 04/10/2015 0734   HDL 57.40 04/10/2015 0734   CHOLHDL 3 04/10/2015 0734   VLDL 13.6 04/10/2015 0734   LDLCALC 126* 04/10/2015 0734      Wt Readings from Last 3 Encounters:  04/16/15 146 lb 12.8 oz (66.588 kg)  10/17/14 141 lb (63.957 kg)  04/20/14 139 lb (63.05 kg)         ASSESSMENT AND PLAN:   1. essential hypertension without heart failure 2. Dyslipidemia 3. diabetes mellitus type II 4. Past history of colon cancer 5. history of esophageal varices secondary to chemotherapy for colon cancer 6. Diarrhea, improving on gluten-free diet and Align.   Current medicines are reviewed at length with the patient today.  The patient does not have concerns regarding medicines.  The following changes have been made:  no change  Labs/ tests ordered today include:   Orders Placed This Encounter  Procedures  . Lipid panel  . Hepatic function panel  . Basic metabolic panel  . Hemoglobin A1c     Disposition:  Continue current medication.  We reviewed today's labs which are satisfactory.  Recheck in 6 months for office visit EKG and A1c lipid panel hepatic function panel and basal metabolic panel.  Try to lose weight and try to increase exercise.  Berna Spare MD 04/16/2015 4:01 PM    [facility] [place], [place], [place]  [address] Phone: [phone]; Fax:  [phone]

## 2015-04-16 NOTE — Patient Instructions (Signed)
Medication Instructions:  Your physician recommends that you continue on your current medications as directed. Please refer to the Current Medication list given to you today.  Labwork: NONE  Testing/Procedures: NONE  Follow-Up: Your physician wants you to follow-up in: 6 months with fasting labs (lp/bmet/hfp/A1C) AND EKG  You will receive a reminder letter in the mail two months in advance. If you don't receive a letter, please call our office to schedule the follow-up appointment.

## 2015-04-22 DIAGNOSIS — I10 Essential (primary) hypertension: Secondary | ICD-10-CM | POA: Diagnosis not present

## 2015-04-22 DIAGNOSIS — E780 Pure hypercholesterolemia: Secondary | ICD-10-CM | POA: Diagnosis not present

## 2015-04-22 DIAGNOSIS — E119 Type 2 diabetes mellitus without complications: Secondary | ICD-10-CM | POA: Diagnosis not present

## 2015-04-22 DIAGNOSIS — F419 Anxiety disorder, unspecified: Secondary | ICD-10-CM | POA: Diagnosis not present

## 2015-04-22 DIAGNOSIS — D696 Thrombocytopenia, unspecified: Secondary | ICD-10-CM | POA: Diagnosis not present

## 2015-05-02 ENCOUNTER — Other Ambulatory Visit: Payer: Self-pay | Admitting: Cardiology

## 2015-05-06 DIAGNOSIS — Z23 Encounter for immunization: Secondary | ICD-10-CM | POA: Diagnosis not present

## 2015-05-27 DIAGNOSIS — I10 Essential (primary) hypertension: Secondary | ICD-10-CM | POA: Diagnosis not present

## 2015-05-27 DIAGNOSIS — K766 Portal hypertension: Secondary | ICD-10-CM | POA: Diagnosis not present

## 2015-05-27 DIAGNOSIS — I8500 Esophageal varices without bleeding: Secondary | ICD-10-CM | POA: Diagnosis not present

## 2015-05-27 DIAGNOSIS — Z885 Allergy status to narcotic agent status: Secondary | ICD-10-CM | POA: Diagnosis not present

## 2015-05-27 DIAGNOSIS — K3189 Other diseases of stomach and duodenum: Secondary | ICD-10-CM | POA: Diagnosis not present

## 2015-05-27 DIAGNOSIS — E119 Type 2 diabetes mellitus without complications: Secondary | ICD-10-CM | POA: Diagnosis not present

## 2015-05-27 DIAGNOSIS — I8510 Secondary esophageal varices without bleeding: Secondary | ICD-10-CM | POA: Diagnosis not present

## 2015-06-23 ENCOUNTER — Other Ambulatory Visit: Payer: Self-pay | Admitting: Cardiology

## 2015-07-22 ENCOUNTER — Other Ambulatory Visit: Payer: Self-pay | Admitting: Cardiology

## 2015-08-21 ENCOUNTER — Other Ambulatory Visit: Payer: Self-pay | Admitting: Cardiology

## 2015-10-02 ENCOUNTER — Other Ambulatory Visit: Payer: Self-pay | Admitting: Cardiology

## 2015-10-16 ENCOUNTER — Other Ambulatory Visit (INDEPENDENT_AMBULATORY_CARE_PROVIDER_SITE_OTHER): Payer: Medicare Other | Admitting: *Deleted

## 2015-10-16 ENCOUNTER — Other Ambulatory Visit: Payer: Self-pay | Admitting: Cardiology

## 2015-10-16 DIAGNOSIS — I119 Hypertensive heart disease without heart failure: Secondary | ICD-10-CM

## 2015-10-16 DIAGNOSIS — I8500 Esophageal varices without bleeding: Secondary | ICD-10-CM

## 2015-10-16 DIAGNOSIS — I8510 Secondary esophageal varices without bleeding: Secondary | ICD-10-CM | POA: Diagnosis not present

## 2015-10-16 DIAGNOSIS — E7800 Pure hypercholesterolemia, unspecified: Secondary | ICD-10-CM | POA: Diagnosis not present

## 2015-10-16 DIAGNOSIS — K766 Portal hypertension: Secondary | ICD-10-CM

## 2015-10-16 DIAGNOSIS — Z79899 Other long term (current) drug therapy: Secondary | ICD-10-CM | POA: Diagnosis not present

## 2015-10-16 LAB — HEPATIC FUNCTION PANEL
ALBUMIN: 3.9 g/dL (ref 3.6–5.1)
ALT: 32 U/L — ABNORMAL HIGH (ref 6–29)
AST: 32 U/L (ref 10–35)
Alkaline Phosphatase: 64 U/L (ref 33–130)
BILIRUBIN INDIRECT: 1.4 mg/dL — AB (ref 0.2–1.2)
BILIRUBIN TOTAL: 1.8 mg/dL — AB (ref 0.2–1.2)
Bilirubin, Direct: 0.4 mg/dL — ABNORMAL HIGH (ref <=0.2)
TOTAL PROTEIN: 7 g/dL (ref 6.1–8.1)

## 2015-10-16 LAB — BASIC METABOLIC PANEL
BUN: 12 mg/dL (ref 7–25)
CHLORIDE: 99 mmol/L (ref 98–110)
CO2: 23 mmol/L (ref 20–31)
Calcium: 9.5 mg/dL (ref 8.6–10.4)
Creat: 0.54 mg/dL (ref 0.50–0.99)
Glucose, Bld: 110 mg/dL — ABNORMAL HIGH (ref 65–99)
POTASSIUM: 3.8 mmol/L (ref 3.5–5.3)
Sodium: 138 mmol/L (ref 135–146)

## 2015-10-16 LAB — HEMOGLOBIN A1C
Hgb A1c MFr Bld: 6.4 % — ABNORMAL HIGH (ref <5.7)
MEAN PLASMA GLUCOSE: 137 mg/dL — AB (ref <117)

## 2015-10-16 LAB — LIPID PANEL
Cholesterol: 186 mg/dL (ref 125–200)
HDL: 59 mg/dL (ref >=46)
LDL CALC: 112 mg/dL (ref <130)
TRIGLYCERIDES: 75 mg/dL (ref <150)
Total CHOL/HDL Ratio: 3.2 Ratio (ref <=5.0)
VLDL: 15 mg/dL (ref <30)

## 2015-10-16 NOTE — Progress Notes (Signed)
Quick Note:  Please make copy of labs for patient visit. ______ 

## 2015-10-16 NOTE — Addendum Note (Signed)
Addended by: Eulis Foster on: 10/16/2015 08:09 AM   Modules accepted: Orders

## 2015-10-16 NOTE — Addendum Note (Signed)
Addended by: Eulis Foster on: 10/16/2015 07:56 AM   Modules accepted: Orders

## 2015-10-17 NOTE — Progress Notes (Signed)
Quick Note:  Please make copy of labs for patient visit. ______ 

## 2015-10-18 DIAGNOSIS — H60391 Other infective otitis externa, right ear: Secondary | ICD-10-CM | POA: Diagnosis not present

## 2015-10-23 ENCOUNTER — Ambulatory Visit (INDEPENDENT_AMBULATORY_CARE_PROVIDER_SITE_OTHER): Payer: Medicare Other | Admitting: Cardiology

## 2015-10-23 ENCOUNTER — Encounter: Payer: Self-pay | Admitting: Cardiology

## 2015-10-23 VITALS — BP 138/76 | HR 60 | Ht 62.0 in | Wt 146.5 lb

## 2015-10-23 DIAGNOSIS — F419 Anxiety disorder, unspecified: Secondary | ICD-10-CM | POA: Diagnosis not present

## 2015-10-23 DIAGNOSIS — E119 Type 2 diabetes mellitus without complications: Secondary | ICD-10-CM

## 2015-10-23 DIAGNOSIS — E7800 Pure hypercholesterolemia, unspecified: Secondary | ICD-10-CM | POA: Diagnosis not present

## 2015-10-23 DIAGNOSIS — I119 Hypertensive heart disease without heart failure: Secondary | ICD-10-CM

## 2015-10-23 MED ORDER — ALPRAZOLAM 0.25 MG PO TABS: 0.2500 mg | ORAL_TABLET | Freq: Every evening | ORAL | Status: AC | PRN

## 2015-10-23 NOTE — Progress Notes (Signed)
Cardiology Office Note   Date:  10/23/2015   ID:  Amber Crosby, DOB 29-Oct-1945, MRN [id number]  PCP:  Tawanna Solo, MD  Cardiologist: Darlin Coco MD  Chief Complaint  Patient presents with  . Follow-up      History of Present Illness: Amber Crosby is a 70 y.o. female who presents for  Six-month follow-up visit  This pleasant 70 year old woman is seen for a scheduled followup office visit.[name] she has a past history of hypertension and diabetes and hypercholesterolemia. She also has a past history of esophageal varices and a past history of colon cancer. She has a problem with chronic mild anemia and also a problem with thrombocytopenia. She thinks that she may have some gluten intolerance.. She has a history of portal hypertension and esophageal varices secondary to previous chemotherapy and has developed esophageal varices which are being treated endoscopically at [facility]. She had a recent endoscopy at [facility]. She did not require any further banding. Her next endoscopy will be in September 2017. Since last visit she has been generally doing well. The patient is now on a gluten-free diet.Her daughter has gone through a divorce and there has been more stress in the family. She thinks that this is playing a role in her diarrhea. The patient has another daughter who has a son age 44 who has severe celiac disease and severe anxieties. The patient has been having a lot of anxiety and stress regarding her family situation with her children and grandchildren.  since last visit she has had no new cardiac symptoms.  No chest pain or shortness of breath or palpitations.  Past Medical History  Diagnosis Date  . Essential hypertension   . Hypercholesterolemia   . Diabetes mellitus     Type 2  . Anxiety   . Ear infection   . Hemorrhoids     Internal-external small hemorrhoids  . Colon cancer [facility])     Without evidence of recurrence    Past Surgical History  Procedure  Laterality Date  . Vaginal hysterectomy  1979  . Esophagogastroduodenoscopy   08/04/2010     Current Outpatient Prescriptions  Medication Sig Dispense Refill  . ALPRAZolam (XANAX) 0.25 MG tablet Take 1 tablet (0.25 mg total) by mouth at bedtime as needed for anxiety. 90 tablet 0  . amLODipine (NORVASC) 5 MG tablet Take 5 mg by mouth daily.    . Cholecalciferol (VITAMIN D PO) Take 1 tablet by mouth daily.     [name] CIPRODEX otic suspension Place 4 drops into the right ear 2 (two) times daily.  0  . ezetimibe (ZETIA) 10 MG tablet Take 1 tablet (10 mg total) by mouth every other day. 45 tablet 1  . losartan-hydrochlorothiazide (HYZAAR) 100-12.5 MG tablet Take 1 tablet by mouth daily. 90 tablet 1  . metFORMIN (GLUCOPHAGE) 500 MG tablet Take 1,000 mg by mouth 2 (two) times daily with a meal.    . nadolol (CORGARD) 80 MG tablet Take 120 mg by mouth daily.    . ondansetron (ZOFRAN) 8 MG tablet Take 8 mg by mouth every 8 (eight) hours as needed for nausea or vomiting.    . rosuvastatin (CRESTOR) 5 MG tablet Take 1 tablet (5 mg total) by mouth every other day. 45 tablet 3   No current facility-administered medications for this visit.    Allergies:   Meperidine and related; Contrast media; Iohexol; and Morphine and related    Social History:  The patient  reports  that she has never smoked. She does not have any smokeless tobacco history on file. She reports that she does not drink alcohol or use illicit drugs.   Family History:  The patient's family history includes Cancer in her mother; Diabetes in her mother; Hypertension in her mother.    ROS:  Please see the history of present illness.   Otherwise, review of systems are positive for none.   All other systems are reviewed and negative.    PHYSICAL EXAM: VS:  BP 138/76 mmHg  Pulse 60  Ht 5\' 2"  (1.575 m)  Wt 146 lb 8 oz (66.452 kg)  BMI 26.79 kg/m2 , BMI Body mass index is 26.79 kg/(m^2). GEN: Well nourished, well developed, in no acute  distress HEENT: normal Neck: no JVD, carotid bruits, or masses Cardiac: RRR; no murmurs, rubs, or gallops,no edema  Respiratory:  clear to auscultation bilaterally, normal work of breathing GI: soft, nontender, nondistended, + BS MS: no deformity or atrophy Skin: warm and dry, no rash Neuro:  Strength and sensation are intact Psych: euthymic mood, full affect   EKG:  EKG is ordered today. The ekg ordered today demonstrates  Normal sinus rhythm at 61 bpm. Within normal limits.   Recent Labs: 10/16/2015: ALT 32*; BUN 12; Creat 0.54; Potassium 3.8; Sodium 138    Lipid Panel    Component Value Date/Time   CHOL 186 10/16/2015 0809   TRIG 75 10/16/2015 0809   HDL 59 10/16/2015 0809   CHOLHDL 3.2 10/16/2015 0809   VLDL 15 10/16/2015 0809   LDLCALC 112 10/16/2015 0809      Wt Readings from Last 3 Encounters:  10/23/15 146 lb 8 oz (66.452 kg)  04/16/15 146 lb 12.8 oz (66.588 kg)  10/17/14 141 lb (63.957 kg)        ASSESSMENT AND PLAN:  1. essential hypertension without heart failure 2. Dyslipidemia 3. diabetes mellitus type II 4. Past history of colon cancer 5. history of esophageal varices secondary to chemotherapy for colon cancer 6. Diarrhea, improving on gluten-free diet and Align. 7. Family stress with children and grandchildren   Current medicines are reviewed at length with the patient today.  The patient does not have concerns regarding medicines.  The following changes have been made:  no change  Labs/ tests ordered today include:   Orders Placed This Encounter  Procedures  . EKG 12-Lead      disposition: The patient will continue her same medication. She will be seeing Dr. May God in 2 weeks regarding her GI tract symptoms. She will be returning to [facility] regarding her esophageal varices next summer.  She will return in 6 months for a follow-up office visit with Dr. Oval Linsey.  Her husband sees Dr. Oval Linsey.  Obtain lipid panel hepatic function  panel and basal metabolic panel at the time of next office visit.  Berna Spare MD 10/23/2015 12:58 PM    [facility] [place], [place],   [address] Phone: [phone]; Fax: [phone]

## 2015-10-23 NOTE — Patient Instructions (Signed)
Medication Instructions:  Your physician recommends that you continue on your current medications as directed. Please refer to the Current Medication list given to you today.  Labwork: none  Testing/Procedures: none  Follow-Up: Your physician wants you to follow-up in: 6 months with fasting labs (lp/bmet/hfp) with Dr Pat Kocher will receive a reminder letter in the mail two months in advance. If you don't receive a letter, please call our office to schedule the follow-up appointment  If you need a refill on your cardiac medications before your next appointment, please call your pharmacy.

## 2015-11-27 DIAGNOSIS — I10 Essential (primary) hypertension: Secondary | ICD-10-CM | POA: Diagnosis not present

## 2015-11-27 DIAGNOSIS — Z7984 Long term (current) use of oral hypoglycemic drugs: Secondary | ICD-10-CM | POA: Diagnosis not present

## 2015-11-27 DIAGNOSIS — E7800 Pure hypercholesterolemia, unspecified: Secondary | ICD-10-CM | POA: Diagnosis not present

## 2015-11-27 DIAGNOSIS — M8580 Other specified disorders of bone density and structure, unspecified site: Secondary | ICD-10-CM | POA: Diagnosis not present

## 2015-11-27 DIAGNOSIS — Z85038 Personal history of other malignant neoplasm of large intestine: Secondary | ICD-10-CM | POA: Diagnosis not present

## 2015-11-27 DIAGNOSIS — F419 Anxiety disorder, unspecified: Secondary | ICD-10-CM | POA: Diagnosis not present

## 2015-11-27 DIAGNOSIS — E119 Type 2 diabetes mellitus without complications: Secondary | ICD-10-CM | POA: Diagnosis not present

## 2015-12-09 DIAGNOSIS — H40012 Open angle with borderline findings, low risk, left eye: Secondary | ICD-10-CM | POA: Diagnosis not present

## 2015-12-09 DIAGNOSIS — H10402 Unspecified chronic conjunctivitis, left eye: Secondary | ICD-10-CM | POA: Diagnosis not present

## 2015-12-09 DIAGNOSIS — H52203 Unspecified astigmatism, bilateral: Secondary | ICD-10-CM | POA: Diagnosis not present

## 2015-12-09 DIAGNOSIS — E119 Type 2 diabetes mellitus without complications: Secondary | ICD-10-CM | POA: Diagnosis not present

## 2016-01-02 ENCOUNTER — Other Ambulatory Visit: Payer: Self-pay | Admitting: *Deleted

## 2016-01-02 ENCOUNTER — Other Ambulatory Visit: Payer: Self-pay

## 2016-01-02 MED ORDER — ROSUVASTATIN CALCIUM 5 MG PO TABS: 5.0000 mg | ORAL_TABLET | ORAL | Status: DC

## 2016-01-08 DIAGNOSIS — D500 Iron deficiency anemia secondary to blood loss (chronic): Secondary | ICD-10-CM | POA: Diagnosis not present

## 2016-01-08 DIAGNOSIS — Z85038 Personal history of other malignant neoplasm of large intestine: Secondary | ICD-10-CM | POA: Diagnosis not present

## 2016-01-08 DIAGNOSIS — A09 Infectious gastroenteritis and colitis, unspecified: Secondary | ICD-10-CM | POA: Diagnosis not present

## 2016-03-03 ENCOUNTER — Other Ambulatory Visit: Payer: Self-pay | Admitting: Cardiovascular Disease

## 2016-03-03 MED ORDER — AMLODIPINE BESYLATE 5 MG PO TABS: 5.0000 mg | ORAL_TABLET | Freq: Every day | ORAL | Status: DC

## 2016-03-03 NOTE — Telephone Encounter (Signed)
Rx request sent to pharmacy.  

## 2016-04-02 ENCOUNTER — Other Ambulatory Visit: Payer: Self-pay | Admitting: *Deleted

## 2016-04-02 MED ORDER — LOSARTAN POTASSIUM-HCTZ 100-12.5 MG PO TABS: 1.0000 | ORAL_TABLET | Freq: Every day | ORAL | Status: DC

## 2016-04-02 MED ORDER — EZETIMIBE 10 MG PO TABS: 10.0000 mg | ORAL_TABLET | ORAL | Status: DC

## 2016-04-02 NOTE — Telephone Encounter (Signed)
Rx has been sent to the pharmacy electronically. ° °

## 2016-05-18 ENCOUNTER — Ambulatory Visit (INDEPENDENT_AMBULATORY_CARE_PROVIDER_SITE_OTHER): Payer: Medicare Other | Admitting: Cardiovascular Disease

## 2016-05-18 VITALS — BP 140/74 | HR 63 | Ht 62.0 in | Wt 145.8 lb

## 2016-05-18 DIAGNOSIS — E785 Hyperlipidemia, unspecified: Secondary | ICD-10-CM | POA: Diagnosis not present

## 2016-05-18 DIAGNOSIS — D696 Thrombocytopenia, unspecified: Secondary | ICD-10-CM

## 2016-05-18 DIAGNOSIS — I119 Hypertensive heart disease without heart failure: Secondary | ICD-10-CM

## 2016-05-18 LAB — LIPID PANEL
CHOL/HDL RATIO: 2.8 ratio (ref <=5.0)
CHOLESTEROL: 188 mg/dL (ref 125–200)
HDL: 67 mg/dL (ref >=46)
LDL Cholesterol: 108 mg/dL (ref <130)
Triglycerides: 66 mg/dL (ref <150)
VLDL: 13 mg/dL (ref <30)

## 2016-05-18 LAB — COMPREHENSIVE METABOLIC PANEL
ALK PHOS: 72 U/L (ref 33–130)
ALT: 30 U/L — AB (ref 6–29)
AST: 32 U/L (ref 10–35)
Albumin: 4.4 g/dL (ref 3.6–5.1)
BILIRUBIN TOTAL: 2.7 mg/dL — AB (ref 0.2–1.2)
BUN: 14 mg/dL (ref 7–25)
CALCIUM: 10 mg/dL (ref 8.6–10.4)
CO2: 25 mmol/L (ref 20–31)
Chloride: 101 mmol/L (ref 98–110)
Creat: 0.55 mg/dL — ABNORMAL LOW (ref 0.60–0.93)
GLUCOSE: 109 mg/dL — AB (ref 65–99)
Potassium: 4 mmol/L (ref 3.5–5.3)
Sodium: 140 mmol/L (ref 135–146)
Total Protein: 7.6 g/dL (ref 6.1–8.1)

## 2016-05-18 LAB — CBC WITH DIFFERENTIAL/PLATELET
BASOS ABS: 57 {cells}/uL (ref 0–200)
Basophils Relative: 1 %
EOS ABS: 342 {cells}/uL (ref 15–500)
Eosinophils Relative: 6 %
HCT: 39.1 % (ref 35.0–45.0)
HEMOGLOBIN: 13.6 g/dL (ref 11.7–15.5)
LYMPHS ABS: 912 {cells}/uL (ref 850–3900)
Lymphocytes Relative: 16 %
MCH: 30.6 pg (ref 27.0–33.0)
MCHC: 34.8 g/dL (ref 32.0–36.0)
MCV: 87.9 fL (ref 80.0–100.0)
MONOS PCT: 10 %
MPV: 11.7 fL (ref 7.5–12.5)
Monocytes Absolute: 570 cells/uL (ref 200–950)
NEUTROS ABS: 3819 {cells}/uL (ref 1500–7800)
NEUTROS PCT: 67 %
Platelets: 125 10*3/uL — ABNORMAL LOW (ref 140–400)
RBC: 4.45 MIL/uL (ref 3.80–5.10)
RDW: 14.4 % (ref 11.0–15.0)
WBC: 5.7 10*3/uL (ref 3.8–10.8)

## 2016-05-18 NOTE — Patient Instructions (Signed)
Medication Instructions:  Your physician recommends that you continue on your current medications as directed. Please refer to the Current Medication list given to you today.  Labwork: LIPID/CMET/CBC at [facility] lab on the first floor  Testing/Procedures: none  Follow-Up: Your physician wants you to follow-up in: 6 month ov You will receive a reminder letter in the mail two months in advance. If you don't receive a letter, please call our office to schedule the follow-up appointment.  If you need a refill on your cardiac medications before your next appointment, please call your pharmacy.

## 2016-05-18 NOTE — Progress Notes (Signed)
Cardiology Office Note   Date:  05/18/2016   ID:  Delaynee, Edquist [birth year], MRN [id number]  PCP:  Tawanna Solo, MD  Cardiologist:   Skeet Latch, MD   No chief complaint on file.     History of Present Illness: Amber Crosby is a 70 y.o. female with diabetes mellitus type 2, hypertension, hyperlipidemia, esophageal varices and portal hypertension 2/2 chemotherapy who presents for follow up.  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.   Overall she has been doing well.  She has some weakness in her left leg since taking chemotherapy in 2006.  She developed melena and was found to have esophageal varices in 2011.  This was also thought to be a result of the chemotherapy.  She has not noted any chest pain or shortness of breath.  She has not been getting much exercise lately but has no symptoms when doing yard work.  She notes mild lower extremity edema when sitting for long periods of time.  She denies orthopnea or PND.  She occasionally checks her BP at home and it is typically around 110-120/70-80s.  Amber Crosby was previously taking rosuvastatin daily but reduced it to every other day because of concern for potential memory deficits.  She was not experiencing any problems at the time.  Past Medical History:  Diagnosis Date  . Anxiety   . Colon cancer [facility])    Without evidence of recurrence  . Diabetes mellitus    Type 2  . Ear infection   . Essential hypertension   . Hemorrhoids    Internal-external small hemorrhoids  . Hypercholesterolemia     Past Surgical History:  Procedure Laterality Date  . ESOPHAGOGASTRODUODENOSCOPY   08/04/2010  . VAGINAL HYSTERECTOMY  1979     Current Outpatient Prescriptions  Medication Sig Dispense Refill  . ALPRAZolam (XANAX) 0.25 MG tablet Take 1 tablet (0.25 mg total) by mouth at bedtime as needed for anxiety. 90 tablet 0  . amLODipine (NORVASC) 5 MG tablet Take 1 tablet (5 mg total) by mouth  daily. Please keep your upcoming appointment (04/21/16) for refills. 30 tablet 3  . Cholecalciferol (VITAMIN D PO) Take 1 tablet by mouth daily.     [name] CIPRODEX otic suspension Place 4 drops into the right ear 2 (two) times daily.  0  . ezetimibe (ZETIA) 10 MG tablet Take 1 tablet (10 mg total) by mouth every other day. 45 tablet 2  . losartan-hydrochlorothiazide (HYZAAR) 100-12.5 MG tablet Take 1 tablet by mouth daily. 90 tablet 1  . metFORMIN (GLUCOPHAGE) 500 MG tablet Take 1,000 mg by mouth 2 (two) times daily with a meal.    . nadolol (CORGARD) 80 MG tablet Take 120 mg by mouth daily.    . ondansetron (ZOFRAN) 8 MG tablet Take 8 mg by mouth every 8 (eight) hours as needed for nausea or vomiting.    . rosuvastatin (CRESTOR) 5 MG tablet Take 1 tablet (5 mg total) by mouth every other day. 45 tablet 1   No current facility-administered medications for this visit.     Allergies:   Meperidine and related; Contrast media [iodinated diagnostic agents]; Iohexol; and Morphine and related    Social History:  The patient  reports that she has never smoked. She does not have any smokeless tobacco history on file. She reports that she does not drink alcohol or use drugs.   Family History:  The patient's family history includes  Cancer in her mother; Diabetes in her mother; Hypertension in her mother.    ROS:  Please see the history of present illness.   Otherwise, review of systems are positive for loose stools.   All other systems are reviewed and negative.    PHYSICAL EXAM: VS:  BP 140/74 (BP Location: Right Arm, Patient Position: Sitting, Cuff Size: Normal)   Pulse 63   Ht 5\' 2"  (1.575 m)   Wt 145 lb 12.8 oz (66.1 kg)   BMI 26.67 kg/m  , BMI Body mass index is 26.67 kg/m. GENERAL:  Well appearing HEENT:  Pupils equal round and reactive, fundi not visualized, oral mucosa unremarkable NECK:  No jugular venous distention, waveform within normal limits, carotid upstroke brisk and symmetric, no  bruits, no thyromegaly LYMPHATICS:  No cervical adenopathy LUNGS:  Clear to auscultation bilaterally HEART:  RRR.  PMI not displaced or sustained,S1 and S2 within normal limits, no S3, no S4, no clicks, no rubs, no murmurs ABD:  Flat, positive bowel sounds normal in frequency in pitch, no bruits, no rebound, no guarding, no midline pulsatile mass, no hepatomegaly, no splenomegaly EXT:  2 plus pulses throughout, no edema, no cyanosis no clubbing SKIN:  No rashes no nodules NEURO:  Cranial nerves II through XII grossly intact, motor grossly intact throughout PSYCH:  Cognitively intact, oriented to person place and time    EKG:  EKG is ordered today. The ekg ordered today demonstrates sinus rhythm rate 63 bpm.     Recent Labs: 10/16/2015: ALT 32; BUN 12; Creat 0.54; Potassium 3.8; Sodium 138    Lipid Panel    Component Value Date/Time   CHOL 186 10/16/2015 0809   TRIG 75 10/16/2015 0809   HDL 59 10/16/2015 0809   CHOLHDL 3.2 10/16/2015 0809   VLDL 15 10/16/2015 0809   LDLCALC 112 10/16/2015 0809      Wt Readings from Last 3 Encounters:  05/18/16 145 lb 12.8 oz (66.1 kg)  10/23/15 146 lb 8 oz (66.5 kg)  04/16/15 146 lb 12.8 oz (66.6 kg)      ASSESSMENT AND PLAN:  # Hypertension: Blood pressure is slightly high, but she thinks this is due to white coat hypertension.  I have asked her to keep a log of her blood pressures and call if they are >140/90.  Continue amlodipine, losartan, HCTZ and nadolol.  # Hyperlipidemia: Amber Crosby is taking rosuvastatin every other day.  Lipids have not been checked since she made that change at her last appointment.  We will check fasting lipids and a CMP.  # Thrombocytopenia: Check CBC.   Current medicines are reviewed at length with the patient today.  The patient does not have concerns regarding medicines.  The following changes have been made:  no change  Labs/ tests ordered today include:  Orders Placed This Encounter  Procedures  .  EKG 12-Lead     Disposition:   FU with Amber Vanwinkle C. Oval Linsey, MD, [facility] in 6 months.     This note was written with the assistance of speech recognition software.  Please excuse any transcriptional errors.  Signed, Tynika Luddy C. Oval Linsey, MD, [facility]  05/18/2016 9:27 AM    [facility]

## 2016-05-19 ENCOUNTER — Encounter: Payer: Self-pay | Admitting: Cardiovascular Disease

## 2016-06-01 DIAGNOSIS — E119 Type 2 diabetes mellitus without complications: Secondary | ICD-10-CM | POA: Diagnosis not present

## 2016-06-01 DIAGNOSIS — I8510 Secondary esophageal varices without bleeding: Secondary | ICD-10-CM | POA: Diagnosis not present

## 2016-06-01 DIAGNOSIS — Z888 Allergy status to other drugs, medicaments and biological substances status: Secondary | ICD-10-CM | POA: Diagnosis not present

## 2016-06-01 DIAGNOSIS — I8500 Esophageal varices without bleeding: Secondary | ICD-10-CM | POA: Diagnosis not present

## 2016-06-01 DIAGNOSIS — Z85038 Personal history of other malignant neoplasm of large intestine: Secondary | ICD-10-CM | POA: Diagnosis not present

## 2016-06-01 DIAGNOSIS — Z79899 Other long term (current) drug therapy: Secondary | ICD-10-CM | POA: Diagnosis not present

## 2016-06-01 DIAGNOSIS — K2980 Duodenitis without bleeding: Secondary | ICD-10-CM | POA: Diagnosis not present

## 2016-06-01 DIAGNOSIS — K766 Portal hypertension: Secondary | ICD-10-CM | POA: Diagnosis not present

## 2016-06-01 DIAGNOSIS — Z91041 Radiographic dye allergy status: Secondary | ICD-10-CM | POA: Diagnosis not present

## 2016-06-01 DIAGNOSIS — K317 Polyp of stomach and duodenum: Secondary | ICD-10-CM | POA: Diagnosis not present

## 2016-06-01 DIAGNOSIS — E785 Hyperlipidemia, unspecified: Secondary | ICD-10-CM | POA: Diagnosis not present

## 2016-06-01 DIAGNOSIS — Z01818 Encounter for other preprocedural examination: Secondary | ICD-10-CM | POA: Diagnosis not present

## 2016-06-01 DIAGNOSIS — K3189 Other diseases of stomach and duodenum: Secondary | ICD-10-CM | POA: Diagnosis not present

## 2016-06-01 DIAGNOSIS — I10 Essential (primary) hypertension: Secondary | ICD-10-CM | POA: Diagnosis not present

## 2016-06-01 DIAGNOSIS — Z885 Allergy status to narcotic agent status: Secondary | ICD-10-CM | POA: Diagnosis not present

## 2016-06-01 DIAGNOSIS — Z7984 Long term (current) use of oral hypoglycemic drugs: Secondary | ICD-10-CM | POA: Diagnosis not present

## 2016-06-10 DIAGNOSIS — Z23 Encounter for immunization: Secondary | ICD-10-CM | POA: Diagnosis not present

## 2016-07-01 DIAGNOSIS — Z1159 Encounter for screening for other viral diseases: Secondary | ICD-10-CM | POA: Diagnosis not present

## 2016-07-01 DIAGNOSIS — Z79899 Other long term (current) drug therapy: Secondary | ICD-10-CM | POA: Diagnosis not present

## 2016-07-01 DIAGNOSIS — F419 Anxiety disorder, unspecified: Secondary | ICD-10-CM | POA: Diagnosis not present

## 2016-07-01 DIAGNOSIS — Z85038 Personal history of other malignant neoplasm of large intestine: Secondary | ICD-10-CM | POA: Diagnosis not present

## 2016-07-01 DIAGNOSIS — E119 Type 2 diabetes mellitus without complications: Secondary | ICD-10-CM | POA: Diagnosis not present

## 2016-07-01 DIAGNOSIS — I10 Essential (primary) hypertension: Secondary | ICD-10-CM | POA: Diagnosis not present

## 2016-07-01 DIAGNOSIS — D696 Thrombocytopenia, unspecified: Secondary | ICD-10-CM | POA: Diagnosis not present

## 2016-07-01 DIAGNOSIS — Z7984 Long term (current) use of oral hypoglycemic drugs: Secondary | ICD-10-CM | POA: Diagnosis not present

## 2016-07-01 DIAGNOSIS — E7800 Pure hypercholesterolemia, unspecified: Secondary | ICD-10-CM | POA: Diagnosis not present

## 2016-07-18 ENCOUNTER — Other Ambulatory Visit: Payer: Self-pay | Admitting: Cardiovascular Disease

## 2016-07-19 ENCOUNTER — Other Ambulatory Visit: Payer: Self-pay | Admitting: *Deleted

## 2016-07-19 NOTE — Telephone Encounter (Signed)
Review for refill. 

## 2016-08-19 ENCOUNTER — Other Ambulatory Visit: Payer: Self-pay | Admitting: Gastroenterology

## 2016-08-19 DIAGNOSIS — K7469 Other cirrhosis of liver: Secondary | ICD-10-CM | POA: Diagnosis not present

## 2016-08-19 DIAGNOSIS — Z85038 Personal history of other malignant neoplasm of large intestine: Secondary | ICD-10-CM | POA: Diagnosis not present

## 2016-08-19 DIAGNOSIS — A09 Infectious gastroenteritis and colitis, unspecified: Secondary | ICD-10-CM | POA: Diagnosis not present

## 2016-08-20 ENCOUNTER — Ambulatory Visit
Admission: RE | Admit: 2016-08-20 | Discharge: 2016-08-20 | Disposition: A | Discharge status: Home / Self Care | Payer: Medicare Other | Source: Ambulatory Visit | Attending: Gastroenterology | Admitting: Gastroenterology

## 2016-08-20 DIAGNOSIS — K7469 Other cirrhosis of liver: Secondary | ICD-10-CM

## 2016-08-20 DIAGNOSIS — K7460 Unspecified cirrhosis of liver: Secondary | ICD-10-CM | POA: Diagnosis not present

## 2016-09-02 ENCOUNTER — Other Ambulatory Visit: Payer: Self-pay | Admitting: Cardiovascular Disease

## 2016-09-02 NOTE — Telephone Encounter (Signed)
Rx(s) sent to pharmacy electronically.  

## 2016-09-02 NOTE — Telephone Encounter (Signed)
Please review for refill.  

## 2016-09-14 DIAGNOSIS — H6691 Otitis media, unspecified, right ear: Secondary | ICD-10-CM | POA: Diagnosis not present

## 2016-09-23 DIAGNOSIS — K921 Melena: Secondary | ICD-10-CM | POA: Diagnosis not present

## 2016-10-14 DIAGNOSIS — L570 Actinic keratosis: Secondary | ICD-10-CM | POA: Diagnosis not present

## 2016-10-14 DIAGNOSIS — Z85828 Personal history of other malignant neoplasm of skin: Secondary | ICD-10-CM | POA: Diagnosis not present

## 2016-11-01 ENCOUNTER — Other Ambulatory Visit: Payer: Self-pay | Admitting: Cardiovascular Disease

## 2016-11-01 NOTE — Telephone Encounter (Signed)
Refill request

## 2016-11-04 ENCOUNTER — Ambulatory Visit: Payer: Medicare Other | Admitting: Cardiovascular Disease

## 2016-12-16 ENCOUNTER — Ambulatory Visit (INDEPENDENT_AMBULATORY_CARE_PROVIDER_SITE_OTHER): Payer: Medicare Other | Admitting: Cardiovascular Disease

## 2016-12-16 VITALS — BP 128/74 | HR 61 | Ht 62.0 in | Wt 150.0 lb

## 2016-12-16 DIAGNOSIS — E7800 Pure hypercholesterolemia, unspecified: Secondary | ICD-10-CM

## 2016-12-16 DIAGNOSIS — I119 Hypertensive heart disease without heart failure: Secondary | ICD-10-CM

## 2016-12-16 MED ORDER — AMLODIPINE BESYLATE 5 MG PO TABS: 5.0000 mg | ORAL_TABLET | Freq: Every day | ORAL | 3 refills | Status: DC

## 2016-12-16 NOTE — Patient Instructions (Signed)

## 2016-12-16 NOTE — Progress Notes (Signed)
Cardiology Office Note   Date:  12/16/2016   ID:  Amber Crosby, DOB 05-02-1946, MRN [id number]  PCP:  Amber Solo, MD  Cardiologist:   Amber Latch, MD   Chief Complaint  Patient presents with  . 6 month visit    pt states no Sx.      History of Present Illness: Amber Crosby is a 71 y.o. female with diabetes mellitus type 2, hypertension, hyperlipidemia, esophageal varices and portal hypertension 2/2 chemotherapy who presents for follow up.  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   Overall she has been doing well.  She has some weakness in her left leg since taking chemotherapy in 2006.  She developed melena and was found to have esophageal varices in 2011.  This was also thought to be a result of the chemotherapy.  Amber Crosby was previously taking rosuvastatin daily but reduced it to every other day because of concern for potential memory deficits.  She was not experiencing any problems at the time.  She denies any chest pain or shortness of breath. She hasn't been exercising regularly but hopes to start walking now that the weather is improving.  At her last appointment her blood pressure was elevated.  She was asked to keep a log of her BP at home.  She notes that her BP has been consistently <130/80 since that time.  Her main complaint today is unpredictable diarrhea.  She follows up with her GI reguarly and goes to [facility] at the end of the summer for evaluation of her varices.  Her h/h has been stable and she denies melena.    Past Medical History:  Diagnosis Date  . Anxiety   . Colon cancer [facility])    Without evidence of recurrence  . Diabetes mellitus    Type 2  . Ear infection   . Essential hypertension   . Hemorrhoids    Internal-external small hemorrhoids  . Hypercholesterolemia     Past Surgical History:  Procedure Laterality Date  . ESOPHAGOGASTRODUODENOSCOPY   08/04/2010  . VAGINAL HYSTERECTOMY  1979     Current  Outpatient Prescriptions  Medication Sig Dispense Refill  . ALPRAZolam (XANAX) 0.25 MG tablet Take 1 tablet (0.25 mg total) by mouth at bedtime as needed for anxiety. 90 tablet 0  . amLODipine (NORVASC) 5 MG tablet Take 1 tablet (5 mg total) by mouth daily. 30 tablet 5  . Cholecalciferol (VITAMIN D PO) Take 1 tablet by mouth daily.     [name] ezetimibe (ZETIA) 10 MG tablet Take 1 tablet (10 mg total) by mouth every other day. 45 tablet 2  . losartan-hydrochlorothiazide (HYZAAR) 100-12.5 MG tablet TAKE 1 TABLET DAILY 90 tablet 0  . metFORMIN (GLUCOPHAGE) 500 MG tablet Take 1,000 mg by mouth 2 (two) times daily with a meal.    . nadolol (CORGARD) 80 MG tablet Take 120 mg by mouth daily.    . ondansetron (ZOFRAN) 8 MG tablet Take 8 mg by mouth every 8 (eight) hours as needed for nausea or vomiting.    . rosuvastatin (CRESTOR) 5 MG tablet Take 1 tablet (5 mg total) by mouth every other day. 45 tablet 1   No current facility-administered medications for this visit.     Allergies:   Meperidine and related; Contrast media [iodinated diagnostic agents]; Iohexol; and Morphine and related    Social History:  The patient  reports that she has never smoked. She does not  have any smokeless tobacco history on file. She reports that she does not drink alcohol or use drugs.   Family History:  The patient's family history includes Cancer in her mother; Diabetes in her mother; Hypertension in her mother.    ROS:  Please see the history of present illness.   Otherwise, review of systems are positive for loose stools.   All other systems are reviewed and negative.    PHYSICAL EXAM: VS:  BP 128/74 (BP Location: Left Arm, Patient Position: Sitting, Cuff Size: Normal)   Pulse 61   Ht 5\' 2"  (1.575 m)   Wt 68 kg (150 lb)   BMI 27.44 kg/m  , BMI Body mass index is 27.44 kg/m. GENERAL:  Well appearing HEENT:  Pupils equal round and reactive, fundi not visualized, oral mucosa unremarkable NECK:  No jugular venous  distention, waveform within normal limits, carotid upstroke brisk and symmetric, no bruits, no thyromegaly LYMPHATICS:  No cervical adenopathy LUNGS:  Clear to auscultation bilaterally HEART:  RRR.  PMI not displaced or sustained,S1 and S2 within normal limits, no S3, no S4, no clicks, no rubs, no murmurs ABD:  Flat, positive bowel sounds normal in frequency in pitch, no bruits, no rebound, no guarding, no midline pulsatile mass, no hepatomegaly, no splenomegaly EXT:  2 plus pulses throughout, trace edema, no cyanosis no clubbing SKIN:  No rashes no nodules NEURO:  Cranial nerves II through XII grossly intact, motor grossly intact throughout PSYCH:  Cognitively intact, oriented to person place and time   EKG:  EKG is ordered today. The ekg ordered today demonstrates sinus rhythm rate 63 bpm.   12/16/17: Sinus rhythm rate 61 bpm.     Recent Labs: 05/18/2016: ALT 30; BUN 14; Creat 0.55; Hemoglobin 13.6; Platelets 125; Potassium 4.0; Sodium 140    Lipid Panel    Component Value Date/Time   CHOL 188 05/18/2016 1029   TRIG 66 05/18/2016 1029   HDL 67 05/18/2016 1029   CHOLHDL 2.8 05/18/2016 1029   VLDL 13 05/18/2016 1029   LDLCALC 108 05/18/2016 1029      Wt Readings from Last 3 Encounters:  12/16/16 68 kg (150 lb)  05/18/16 66.1 kg (145 lb 12.8 oz)  10/23/15 66.5 kg (146 lb 8 oz)      ASSESSMENT AND PLAN:  # Hypertension: Blood pressure is better controlled and has been good at home. Continue amlodipine, losartan, hydrochlorothiazide, and nadolol.  # Hyperlipidemia: Amber Crosby is taking rosuvastatin every other day.  LDL was 108 04/2016.  We will repeat annually.  Continue rosuvastatin and Zetia.  I encouraged her to start exercising more regularly and to get a gym membership in the winter instead of avoiding exercise.      Current medicines are reviewed at length with the patient today.  The patient does not have concerns regarding medicines.  The following changes have been  made:  no change  Labs/ tests ordered today include:  No orders of the defined types were placed in this encounter.    Disposition:   FU with Shemaiah Round C. Oval Linsey, MD, [facility] in 1 year    This note was written with the assistance of speech recognition software.  Please excuse any transcriptional errors.  Signed, Dierdre Mccalip C. Oval Linsey, MD, [facility]  12/16/2016 12:56 PM    [place]

## 2016-12-24 ENCOUNTER — Other Ambulatory Visit: Payer: Self-pay | Admitting: Cardiovascular Disease

## 2016-12-24 NOTE — Telephone Encounter (Signed)
Will you please advise for refill, thanks! 

## 2016-12-24 NOTE — Telephone Encounter (Signed)
REFILL 

## 2016-12-28 DIAGNOSIS — H35363 Drusen (degenerative) of macula, bilateral: Secondary | ICD-10-CM | POA: Diagnosis not present

## 2016-12-28 DIAGNOSIS — E119 Type 2 diabetes mellitus without complications: Secondary | ICD-10-CM | POA: Diagnosis not present

## 2016-12-28 DIAGNOSIS — H52203 Unspecified astigmatism, bilateral: Secondary | ICD-10-CM | POA: Diagnosis not present

## 2017-01-11 DIAGNOSIS — D696 Thrombocytopenia, unspecified: Secondary | ICD-10-CM | POA: Diagnosis not present

## 2017-01-11 DIAGNOSIS — E119 Type 2 diabetes mellitus without complications: Secondary | ICD-10-CM | POA: Diagnosis not present

## 2017-01-11 DIAGNOSIS — I10 Essential (primary) hypertension: Secondary | ICD-10-CM | POA: Diagnosis not present

## 2017-01-11 DIAGNOSIS — E7800 Pure hypercholesterolemia, unspecified: Secondary | ICD-10-CM | POA: Diagnosis not present

## 2017-01-11 DIAGNOSIS — Z79899 Other long term (current) drug therapy: Secondary | ICD-10-CM | POA: Diagnosis not present

## 2017-01-11 DIAGNOSIS — Z7984 Long term (current) use of oral hypoglycemic drugs: Secondary | ICD-10-CM | POA: Diagnosis not present

## 2017-01-20 ENCOUNTER — Other Ambulatory Visit: Payer: Self-pay | Admitting: Cardiovascular Disease

## 2017-01-20 NOTE — Telephone Encounter (Signed)
Please review for refill. Thank you! 

## 2017-01-25 DIAGNOSIS — A09 Infectious gastroenteritis and colitis, unspecified: Secondary | ICD-10-CM | POA: Diagnosis not present

## 2017-01-25 DIAGNOSIS — K7469 Other cirrhosis of liver: Secondary | ICD-10-CM | POA: Diagnosis not present

## 2017-02-09 DIAGNOSIS — S30861A Insect bite (nonvenomous) of abdominal wall, initial encounter: Secondary | ICD-10-CM | POA: Diagnosis not present

## 2017-02-09 DIAGNOSIS — W57XXXA Bitten or stung by nonvenomous insect and other nonvenomous arthropods, initial encounter: Secondary | ICD-10-CM | POA: Diagnosis not present

## 2017-02-16 DIAGNOSIS — Z1231 Encounter for screening mammogram for malignant neoplasm of breast: Secondary | ICD-10-CM | POA: Diagnosis not present

## 2017-02-16 DIAGNOSIS — M8589 Other specified disorders of bone density and structure, multiple sites: Secondary | ICD-10-CM | POA: Diagnosis not present

## 2017-02-22 ENCOUNTER — Other Ambulatory Visit: Payer: Self-pay | Admitting: Cardiovascular Disease

## 2017-02-22 NOTE — Telephone Encounter (Signed)
Refill Request.  

## 2017-02-22 NOTE — Telephone Encounter (Signed)
Please review for refill, thanks ! 

## 2017-02-25 DIAGNOSIS — M8580 Other specified disorders of bone density and structure, unspecified site: Secondary | ICD-10-CM | POA: Diagnosis not present

## 2017-03-23 ENCOUNTER — Other Ambulatory Visit: Payer: Self-pay | Admitting: Cardiovascular Disease

## 2017-03-24 NOTE — Telephone Encounter (Signed)
Please review for refill. Thanks!  

## 2017-03-28 DIAGNOSIS — L821 Other seborrheic keratosis: Secondary | ICD-10-CM | POA: Diagnosis not present

## 2017-03-28 DIAGNOSIS — I788 Other diseases of capillaries: Secondary | ICD-10-CM | POA: Diagnosis not present

## 2017-03-28 DIAGNOSIS — L738 Other specified follicular disorders: Secondary | ICD-10-CM | POA: Diagnosis not present

## 2017-03-28 DIAGNOSIS — Z85828 Personal history of other malignant neoplasm of skin: Secondary | ICD-10-CM | POA: Diagnosis not present

## 2017-03-28 DIAGNOSIS — L218 Other seborrheic dermatitis: Secondary | ICD-10-CM | POA: Diagnosis not present

## 2017-04-13 ENCOUNTER — Telehealth: Payer: Self-pay | Admitting: Cardiovascular Disease

## 2017-04-13 NOTE — Telephone Encounter (Signed)
New Message     Pt c/o medication issue:  1. Name of Medication:  losartan 100-12.5 mg  2. How are you currently taking this medication (dosage and times per day)?  1 day   3. Are you having a reaction (difficulty breathing--STAT)? no   4. What is your medication issue?  Pt believes this was recalled also, silver script told her this was recalled also

## 2017-04-13 NOTE — Telephone Encounter (Signed)
Checked all FDA recall from the last 2 montrhs.  No recall exits (at this time) for losartan, irbesartan , olmesartan or candesartan.  Losartan/HCTZ product should be fine.

## 2017-04-13 NOTE — Telephone Encounter (Signed)
Patient called with pharmacy advice. She was still upset since [employer], her prescription supplier, notified her that her Hyzaar was on recall. Informed her that per our pharmacy staff and FDA, this is not on recall. She expressed frustration since she has h/o colon cancer. Apologized that this is the only info I have to provide her with but did reiterate that our pharmacy staff looked into this.

## 2017-04-13 NOTE — Telephone Encounter (Signed)
LMTCB

## 2017-04-13 NOTE — Telephone Encounter (Signed)
Routed to pharmacy staff - unsure if this ARB (ARB combo) is on recall

## 2017-05-30 ENCOUNTER — Other Ambulatory Visit: Payer: Self-pay | Admitting: Cardiovascular Disease

## 2017-05-30 NOTE — Telephone Encounter (Signed)
Refill Request.  

## 2017-06-09 DIAGNOSIS — Z23 Encounter for immunization: Secondary | ICD-10-CM | POA: Diagnosis not present

## 2017-06-21 DIAGNOSIS — K7469 Other cirrhosis of liver: Secondary | ICD-10-CM | POA: Diagnosis not present

## 2017-06-21 DIAGNOSIS — Z7984 Long term (current) use of oral hypoglycemic drugs: Secondary | ICD-10-CM | POA: Diagnosis not present

## 2017-06-21 DIAGNOSIS — E119 Type 2 diabetes mellitus without complications: Secondary | ICD-10-CM | POA: Diagnosis not present

## 2017-06-21 DIAGNOSIS — D696 Thrombocytopenia, unspecified: Secondary | ICD-10-CM | POA: Diagnosis not present

## 2017-07-15 DIAGNOSIS — Z85038 Personal history of other malignant neoplasm of large intestine: Secondary | ICD-10-CM | POA: Diagnosis not present

## 2017-07-15 DIAGNOSIS — K7469 Other cirrhosis of liver: Secondary | ICD-10-CM | POA: Diagnosis not present

## 2017-07-19 DIAGNOSIS — K766 Portal hypertension: Secondary | ICD-10-CM | POA: Diagnosis not present

## 2017-07-19 DIAGNOSIS — Z885 Allergy status to narcotic agent status: Secondary | ICD-10-CM | POA: Diagnosis not present

## 2017-07-19 DIAGNOSIS — K3189 Other diseases of stomach and duodenum: Secondary | ICD-10-CM | POA: Diagnosis not present

## 2017-07-19 DIAGNOSIS — I8501 Esophageal varices with bleeding: Secondary | ICD-10-CM | POA: Diagnosis not present

## 2017-07-19 DIAGNOSIS — Z91041 Radiographic dye allergy status: Secondary | ICD-10-CM | POA: Diagnosis not present

## 2017-07-19 DIAGNOSIS — I8510 Secondary esophageal varices without bleeding: Secondary | ICD-10-CM | POA: Diagnosis not present

## 2017-07-19 DIAGNOSIS — I10 Essential (primary) hypertension: Secondary | ICD-10-CM | POA: Diagnosis not present

## 2017-07-19 DIAGNOSIS — E119 Type 2 diabetes mellitus without complications: Secondary | ICD-10-CM | POA: Diagnosis not present

## 2017-07-19 DIAGNOSIS — Z79899 Other long term (current) drug therapy: Secondary | ICD-10-CM | POA: Diagnosis not present

## 2017-09-06 ENCOUNTER — Other Ambulatory Visit: Payer: Self-pay | Admitting: Gastroenterology

## 2017-09-06 DIAGNOSIS — K7469 Other cirrhosis of liver: Secondary | ICD-10-CM

## 2017-09-15 ENCOUNTER — Ambulatory Visit
Admission: RE | Admit: 2017-09-15 | Discharge: 2017-09-15 | Disposition: A | Discharge status: Home / Self Care | Payer: Medicare Other | Source: Ambulatory Visit | Attending: Gastroenterology | Admitting: Gastroenterology

## 2017-09-15 DIAGNOSIS — K7469 Other cirrhosis of liver: Secondary | ICD-10-CM

## 2017-09-15 DIAGNOSIS — K7460 Unspecified cirrhosis of liver: Secondary | ICD-10-CM | POA: Diagnosis not present

## 2017-09-16 ENCOUNTER — Other Ambulatory Visit: Payer: Self-pay | Admitting: Gastroenterology

## 2017-09-16 DIAGNOSIS — R9389 Abnormal findings on diagnostic imaging of other specified body structures: Secondary | ICD-10-CM

## 2017-09-29 ENCOUNTER — Ambulatory Visit
Admission: RE | Admit: 2017-09-29 | Discharge: 2017-09-29 | Disposition: A | Discharge status: Home / Self Care | Payer: 59 | Source: Ambulatory Visit | Attending: Gastroenterology | Admitting: Gastroenterology

## 2017-09-29 DIAGNOSIS — R9389 Abnormal findings on diagnostic imaging of other specified body structures: Secondary | ICD-10-CM

## 2017-09-29 MED ORDER — GADOBENATE DIMEGLUMINE 529 MG/ML IV SOLN
14.0000 mL | Freq: Once | INTRAVENOUS | Status: AC | PRN
  Administered 2017-09-29: 14 mL via INTRAVENOUS

## 2017-10-03 DIAGNOSIS — H5213 Myopia, bilateral: Secondary | ICD-10-CM | POA: Diagnosis not present

## 2017-10-03 DIAGNOSIS — H2513 Age-related nuclear cataract, bilateral: Secondary | ICD-10-CM | POA: Diagnosis not present

## 2017-10-03 DIAGNOSIS — H401131 Primary open-angle glaucoma, bilateral, mild stage: Secondary | ICD-10-CM | POA: Diagnosis not present

## 2017-10-06 DIAGNOSIS — K766 Portal hypertension: Secondary | ICD-10-CM | POA: Diagnosis not present

## 2017-10-06 DIAGNOSIS — K7469 Other cirrhosis of liver: Secondary | ICD-10-CM | POA: Diagnosis not present

## 2017-10-06 DIAGNOSIS — R161 Splenomegaly, not elsewhere classified: Secondary | ICD-10-CM | POA: Diagnosis not present

## 2017-10-17 ENCOUNTER — Telehealth: Payer: Self-pay | Admitting: Cardiovascular Disease

## 2017-10-17 MED ORDER — AMLODIPINE BESYLATE 5 MG PO TABS: 5.0000 mg | ORAL_TABLET | Freq: Every day | ORAL | 3 refills | Status: DC

## 2017-10-17 MED ORDER — EZETIMIBE 10 MG PO TABS: 10.0000 mg | ORAL_TABLET | ORAL | 3 refills | Status: DC

## 2017-10-17 MED ORDER — ROSUVASTATIN CALCIUM 5 MG PO TABS
5.0000 mg | ORAL_TABLET | ORAL | 2 refills | Status: DC
Start: 2017-10-17 — End: 2017-12-05

## 2017-10-17 MED ORDER — LOSARTAN POTASSIUM-HCTZ 100-12.5 MG PO TABS: 1.0000 | ORAL_TABLET | Freq: Every day | ORAL | 3 refills | Status: DC

## 2017-10-17 NOTE — Telephone Encounter (Signed)
Prefer that she stay on rosuvastatin.  Is she taking generic?

## 2017-10-17 NOTE — Telephone Encounter (Signed)
New message     *STAT* If patient is at the pharmacy, call can be transferred to refill team.   1. Which medications need to be refilled? (please list name of each medication and dose if known)  Losartan, Zetia, Crestor, Amlodipine  2. Which pharmacy/location (including street and city if local pharmacy) is medication to be sent to? [facility] , fax [phone] ,account # [id number]  3. Do they need a 30 day or 90 day supply? [place]

## 2017-10-17 NOTE — Telephone Encounter (Signed)
New message     Pt c/o medication issue:  1. Name of Medication: Crestor and Zetia  2. How are you currently taking this medication (dosage and times per day)? As prescribed  3. Are you having a reaction (difficulty breathing--STAT)? No 4. What is your medication issue? Patient wants to stop one of these medications due to cosr

## 2017-10-17 NOTE — Telephone Encounter (Signed)
Spoke with pt, they are having to pay for their insurance now and both the crestor and zetia are on the upper tiers. She is currently taking the zetia alternating with the rosustatin every other day. She would like to stop one of the medications and then take the other on a daily basis. Will forward to dr Oval Linsey to review and advise.

## 2017-10-18 NOTE — Telephone Encounter (Signed)
Advised patient and she will continue both as prescribed

## 2017-12-05 ENCOUNTER — Ambulatory Visit (INDEPENDENT_AMBULATORY_CARE_PROVIDER_SITE_OTHER): Payer: Medicare Other | Admitting: Cardiovascular Disease

## 2017-12-05 ENCOUNTER — Encounter: Payer: Self-pay | Admitting: Cardiovascular Disease

## 2017-12-05 VITALS — BP 135/71 | HR 71 | Ht 62.0 in | Wt 153.6 lb

## 2017-12-05 DIAGNOSIS — E785 Hyperlipidemia, unspecified: Secondary | ICD-10-CM | POA: Diagnosis not present

## 2017-12-05 DIAGNOSIS — E7800 Pure hypercholesterolemia, unspecified: Secondary | ICD-10-CM

## 2017-12-05 DIAGNOSIS — I119 Hypertensive heart disease without heart failure: Secondary | ICD-10-CM

## 2017-12-05 DIAGNOSIS — Z5181 Encounter for therapeutic drug level monitoring: Secondary | ICD-10-CM | POA: Diagnosis not present

## 2017-12-05 DIAGNOSIS — R6889 Other general symptoms and signs: Secondary | ICD-10-CM

## 2017-12-05 LAB — CBC WITH DIFFERENTIAL/PLATELET
BASOS ABS: 0.1 10*3/uL (ref 0.0–0.2)
Basos: 2 %
EOS (ABSOLUTE): 0.4 10*3/uL (ref 0.0–0.4)
Eos: 9 %
Hematocrit: 38.3 % (ref 34.0–46.6)
Hemoglobin: 13.3 g/dL (ref 11.1–15.9)
Immature Grans (Abs): 0 10*3/uL (ref 0.0–0.1)
Immature Granulocytes: 0 %
LYMPHS ABS: 0.8 10*3/uL (ref 0.7–3.1)
Lymphs: 17 %
MCH: 31.2 pg (ref 26.6–33.0)
MCHC: 34.7 g/dL (ref 31.5–35.7)
MCV: 90 fL (ref 79–97)
MONOS ABS: 0.5 10*3/uL (ref 0.1–0.9)
Monocytes: 9 %
NEUTROS ABS: 3.2 10*3/uL (ref 1.4–7.0)
Neutrophils: 63 %
PLATELETS: 137 10*3/uL — AB (ref 150–379)
RBC: 4.26 x10E6/uL (ref 3.77–5.28)
RDW: 15 % (ref 12.3–15.4)
WBC: 5 10*3/uL (ref 3.4–10.8)

## 2017-12-05 LAB — COMPREHENSIVE METABOLIC PANEL
ALBUMIN: 4.3 g/dL (ref 3.5–4.8)
ALK PHOS: 88 IU/L (ref 39–117)
ALT: 38 IU/L — ABNORMAL HIGH (ref 0–32)
AST: 37 IU/L (ref 0–40)
Albumin/Globulin Ratio: 1.6 (ref 1.2–2.2)
BUN/Creatinine Ratio: 28 (ref 12–28)
BUN: 15 mg/dL (ref 8–27)
Bilirubin Total: 2.8 mg/dL — ABNORMAL HIGH (ref 0.0–1.2)
CALCIUM: 9.7 mg/dL (ref 8.7–10.3)
CO2: 20 mmol/L (ref 20–29)
CREATININE: 0.53 mg/dL — AB (ref 0.57–1.00)
Chloride: 97 mmol/L (ref 96–106)
GFR calc Af Amer: 110 mL/min/{1.73_m2} (ref >59)
GFR calc non Af Amer: 96 mL/min/{1.73_m2} (ref >59)
GLUCOSE: 106 mg/dL — AB (ref 65–99)
Globulin, Total: 2.7 g/dL (ref 1.5–4.5)
Potassium: 4.5 mmol/L (ref 3.5–5.2)
Sodium: 136 mmol/L (ref 134–144)
TOTAL PROTEIN: 7 g/dL (ref 6.0–8.5)

## 2017-12-05 LAB — LIPID PANEL
Chol/HDL Ratio: 3.1 ratio (ref 0.0–4.4)
Cholesterol, Total: 190 mg/dL (ref 100–199)
HDL: 62 mg/dL (ref >39)
LDL Calculated: 112 mg/dL — ABNORMAL HIGH (ref 0–99)
Triglycerides: 80 mg/dL (ref 0–149)
VLDL CHOLESTEROL CAL: 16 mg/dL (ref 5–40)

## 2017-12-05 LAB — TSH: TSH: 0.799 u[IU]/mL (ref 0.450–4.500)

## 2017-12-05 MED ORDER — ROSUVASTATIN CALCIUM 5 MG PO TABS: 5.0000 mg | ORAL_TABLET | Freq: Every day | ORAL | 3 refills | Status: DC

## 2017-12-05 NOTE — Progress Notes (Signed)
Cardiology Office Note   Date:  12/05/2017   ID:  Amber Crosby, DOB 12-06-1945, MRN [id number]  PCP:  Amber Lass, MD  Cardiologist:   Amber Latch, MD   Chief Complaint  Patient presents with  . Follow-up    Pt states no Sx.       History of Present Illness: Amber Crosby is a 72 y.o. female with diabetes mellitus type 2, hypertension, hyperlipidemia, esophageal varices and portal hypertension 2/2 chemotherapy who presents for follow up.  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   Overall she has been doing well.  She has some weakness in her left leg since taking chemotherapy in 2006.  She developed melena and was found to have esophageal varices in 2011.  This was also thought to be a result of the chemotherapy.  Amber Crosby was previously taking rosuvastatin daily but reduced it to every other day because of concern for potential memory deficits.  She was not experiencing any problems at the time.  She denies any chest pain or shortness of breath. She hasn't been exercising regularly but hopes to start walking now that the weather is improving.  At her last appointment her blood pressure was elevated.  She was asked to keep a log of her BP at home.  She notes that her BP has been consistently <130/80 since that time.  Her main complaint today is unpredictable diarrhea.  She follows up with her GI reguarly and goes to [facility] at the end of the summer for evaluation of her varices.  Her h/h has been stable and she denies melena.   Since her last appointment Amber Crosby has been well.  She saw Amber Crosby who referred her for an abdominal ultrasound that revealed a portal vein thrombus.  She was also noted to have an area of nodular echogenicity and MRI was ordered.  No mass was noted on her MRI 09/2017.  This is being monitored and there are no plans for anticoagulation at this time.  Overall she has been feeling well.  She has no chest pain or shortness of breath.   She also denies lower extremity edema, orthopnea, or PND.  For the last 4 or 5 months she has not been exercising as much.  This is because she is been helping to take care of her great grandchild for 10 hours each day.   Past Medical History:  Diagnosis Date  . Anxiety   . Colon cancer [facility])    Without evidence of recurrence  . Diabetes mellitus    Type 2  . Ear infection   . Essential hypertension   . Hemorrhoids    Internal-external small hemorrhoids  . Hypercholesterolemia     Past Surgical History:  Procedure Laterality Date  . ESOPHAGOGASTRODUODENOSCOPY   08/04/2010  . VAGINAL HYSTERECTOMY  1979     Current Outpatient Medications  Medication Sig Dispense Refill  . ALPRAZolam (XANAX) 0.25 MG tablet Take 1 tablet (0.25 mg total) by mouth at bedtime as needed for anxiety. 90 tablet 0  . amLODipine (NORVASC) 5 MG tablet Take 1 tablet (5 mg total) by mouth daily. 90 tablet 3  . Cholecalciferol (VITAMIN D PO) Take 1 tablet by mouth daily.     [name] losartan-hydrochlorothiazide (HYZAAR) 100-12.5 MG tablet Take 1 tablet by mouth daily. 90 tablet 3  . metFORMIN (GLUCOPHAGE) 500 MG tablet Take 1,000 mg by mouth 2 (two) times daily with a  meal.    . nadolol (CORGARD) 80 MG tablet Take 120 mg by mouth daily.    . ondansetron (ZOFRAN) 8 MG tablet Take 8 mg by mouth every 8 (eight) hours as needed for nausea or vomiting.    . rosuvastatin (CRESTOR) 5 MG tablet Take 1 tablet (5 mg total) by mouth daily. 90 tablet 3   No current facility-administered medications for this visit.     Allergies:   Meperidine and related; Contrast media [iodinated diagnostic agents]; Iohexol; and Morphine and related    Social History:  The patient  reports that  has never smoked. she has never used smokeless tobacco. She reports that she does not drink alcohol or use drugs.   Family History:  The patient's family history includes Cancer in her mother; Diabetes in her mother; Hypertension in her mother.     ROS:  Please see the history of present illness.   Otherwise, review of systems are positive for loose stools.   All other systems are reviewed and negative.    PHYSICAL EXAM: VS:  BP 135/71   Pulse 71   Ht 5\' 2"  (1.575 m)   Wt 153 lb 9.6 oz (69.7 kg)   BMI 28.09 kg/m  , BMI Body mass index is 28.09 kg/m. GENERAL:  Well appearing HEENT: Pupils equal round and reactive, fundi not visualized, oral mucosa unremarkable NECK:  No jugular venous distention, waveform within normal limits, carotid upstroke brisk and symmetric, no bruits LUNGS:  Clear to auscultation bilaterally HEART:  RRR.  PMI not displaced or sustained,S1 and S2 within normal limits, no S3, no S4, no clicks, no rubs, no murmurs ABD:  Flat, positive bowel sounds normal in frequency in pitch, no bruits, no rebound, no guarding, no midline pulsatile mass, no hepatomegaly, no splenomegaly EXT:  2 plus pulses throughout, no edema, no cyanosis no clubbing SKIN:  No rashes no nodules NEURO:  Cranial nerves II through XII grossly intact, motor grossly intact throughout PSYCH:  Cognitively intact, oriented to person place and time   EKG:  EKG is ordered today. The ekg ordered today demonstrates sinus rhythm rate 63 bpm.   12/16/16: Sinus rhythm rate 61 bpm.   12/05/17: Sinus rhythm.  Rate 68 bpm.     Recent Labs: No results found for requested labs within last 8760 hours.    Lipid Panel    Component Value Date/Time   CHOL 188 05/18/2016 1029   TRIG 66 05/18/2016 1029   HDL 67 05/18/2016 1029   CHOLHDL 2.8 05/18/2016 1029   VLDL 13 05/18/2016 1029   LDLCALC 108 05/18/2016 1029      Wt Readings from Last 3 Encounters:  12/05/17 153 lb 9.6 oz (69.7 kg)  12/16/16 150 lb (68 kg)  05/18/16 145 lb 12.8 oz (66.1 kg)      ASSESSMENT AND PLAN:  # Hypertension: Blood pressure is well controlled on amlodipine, losartan, HCTZ, and nadolol.  # Hyperlipidemia: Check lipids today.  Patient changed insurance and wants to  change her cholesterol medication.  Rosuvastatin was previously reduced due to pain in her left leg.  Since that time it is been discovered that chemotherapy was the cause of her pain.  It did not change after the dose was reduced.  Therefore we will stop ezetimibe and increase rosuvastatin to 5 mg daily.  Repeat lipids and CMP in 3 months.  # Cold intolerance: Check thyroid function.     Current medicines are reviewed at length with the patient today.  The patient does not have co while like a calorie ncerns regarding medicines.  The following changes have been made:  Stop Zetia.  Rosuvastatin 5mg  daily   Labs/ tests ordered today include:   Orders Placed This Encounter  Procedures  . Lipid panel  . Comprehensive metabolic panel  . CBC with Differential/Platelet  . TSH  . Lipid panel  . Comprehensive metabolic panel  . EKG 12-Lead     Disposition:   FU with Jomayra Novitsky C. Oval Linsey, MD, [facility] in 1 year    Growing boys can he  Signed, Kenora Spayd C. Oval Linsey, MD, [facility]  12/05/2017 10:36 AM    [place]

## 2017-12-05 NOTE — Patient Instructions (Signed)
Medication Instructions:  STOP ZETIA   CHANGE YOUR CRESTOR (ROSUVASTATIN) TO 5 MG DAILY   Labwork: LP/CMET/CBC/TSH TODAY  FASTING LP/CMET IN 3 MONTHS   Testing/Procedures: NONE  Follow-Up: Your physician wants you to follow-up in: [place] will receive a reminder letter in the mail two months in advance. If you don't receive a letter, please call our office to schedule the follow-up appointment.  If you need a refill on your cardiac medications before your next appointment, please call your pharmacy.

## 2018-01-20 ENCOUNTER — Telehealth: Payer: Self-pay | Admitting: Cardiovascular Disease

## 2018-01-20 NOTE — Telephone Encounter (Signed)
New Message   Pt c/o medication issue:  1. Name of Medication: losartan-hydrochlorothiazide (HYZAAR) 100-12.5 MG tablet  2. How are you currently taking this medication (dosage and times per day)? 12.5 mg 1 tablet  3. Are you having a reaction (difficulty breathing--STAT)?   4. What is your medication issue? Patient is calling in reference to a recall letter that she received for this medication. Please call to discuss.

## 2018-01-20 NOTE — Telephone Encounter (Signed)
Returned call to patient who received a general letter from [facility] that losartan-hctz was recalled. Letter did not state it was her specific manufacturer or lot #. Advised patient to call dispensing pharmacy to inquire about this and call back if she received a recalled batch of medication so her med can be changed. She voiced understanding.

## 2018-02-06 ENCOUNTER — Telehealth: Payer: Self-pay | Admitting: *Deleted

## 2018-02-06 DIAGNOSIS — E785 Hyperlipidemia, unspecified: Secondary | ICD-10-CM

## 2018-02-06 DIAGNOSIS — E119 Type 2 diabetes mellitus without complications: Secondary | ICD-10-CM

## 2018-02-06 DIAGNOSIS — I119 Hypertensive heart disease without heart failure: Secondary | ICD-10-CM

## 2018-02-06 DIAGNOSIS — Z5181 Encounter for therapeutic drug level monitoring: Secondary | ICD-10-CM

## 2018-02-06 NOTE — Telephone Encounter (Signed)
Patient in office today requesting when her labs are done can a Hgb A1C be added at the request of her PCP. Added and will mail lab orders to patient.

## 2018-03-04 LAB — HEMOGLOBIN A1C
ESTIMATED AVERAGE GLUCOSE: 126 mg/dL
HEMOGLOBIN A1C: 6 % — AB (ref 4.8–5.6)

## 2018-03-04 LAB — COMPREHENSIVE METABOLIC PANEL
A/G RATIO: 2 (ref 1.2–2.2)
ALBUMIN: 4.3 g/dL (ref 3.5–4.8)
ALT: 30 IU/L (ref 0–32)
AST: 33 IU/L (ref 0–40)
Alkaline Phosphatase: 70 IU/L (ref 39–117)
BILIRUBIN TOTAL: 2 mg/dL — AB (ref 0.0–1.2)
BUN / CREAT RATIO: 19 (ref 12–28)
BUN: 10 mg/dL (ref 8–27)
CALCIUM: 9.8 mg/dL (ref 8.7–10.3)
CHLORIDE: 100 mmol/L (ref 96–106)
CO2: 25 mmol/L (ref 20–29)
Creatinine, Ser: 0.53 mg/dL — ABNORMAL LOW (ref 0.57–1.00)
GFR calc non Af Amer: 96 mL/min/{1.73_m2} (ref >59)
GFR, EST AFRICAN AMERICAN: 110 mL/min/{1.73_m2} (ref >59)
Globulin, Total: 2.2 g/dL (ref 1.5–4.5)
Glucose: 107 mg/dL — ABNORMAL HIGH (ref 65–99)
POTASSIUM: 4.9 mmol/L (ref 3.5–5.2)
Sodium: 140 mmol/L (ref 134–144)
TOTAL PROTEIN: 6.5 g/dL (ref 6.0–8.5)

## 2018-03-04 LAB — LIPID PANEL
Chol/HDL Ratio: 2.2 ratio (ref 0.0–4.4)
Cholesterol, Total: 129 mg/dL (ref 100–199)
HDL: 60 mg/dL (ref >39)
LDL Calculated: 60 mg/dL (ref 0–99)
TRIGLYCERIDES: 47 mg/dL (ref 0–149)
VLDL Cholesterol Cal: 9 mg/dL (ref 5–40)

## 2018-06-14 ENCOUNTER — Other Ambulatory Visit: Payer: Self-pay | Admitting: *Deleted

## 2018-06-14 MED ORDER — ROSUVASTATIN CALCIUM 5 MG PO TABS: 5.0000 mg | ORAL_TABLET | Freq: Every day | ORAL | 2 refills | Status: DC

## 2018-08-25 ENCOUNTER — Other Ambulatory Visit: Payer: Self-pay

## 2018-08-25 MED ORDER — AMLODIPINE BESYLATE 5 MG PO TABS: 5.0000 mg | ORAL_TABLET | Freq: Every day | ORAL | 3 refills | Status: DC

## 2018-08-29 ENCOUNTER — Emergency Department (HOSPITAL_COMMUNITY)
Admission: EM | Admit: 2018-08-29 | Discharge: 2018-08-29 | Disposition: A | Discharge status: Home / Self Care | Payer: Medicare Other | Attending: Emergency Medicine | Admitting: Emergency Medicine

## 2018-08-29 ENCOUNTER — Emergency Department (HOSPITAL_COMMUNITY): Payer: Medicare Other

## 2018-08-29 ENCOUNTER — Encounter: Payer: Self-pay | Admitting: Emergency Medicine

## 2018-08-29 ENCOUNTER — Ambulatory Visit (HOSPITAL_COMMUNITY): Payer: Medicare Other

## 2018-08-29 DIAGNOSIS — R4182 Altered mental status, unspecified: Secondary | ICD-10-CM | POA: Diagnosis present

## 2018-08-29 DIAGNOSIS — Z85038 Personal history of other malignant neoplasm of large intestine: Secondary | ICD-10-CM | POA: Diagnosis not present

## 2018-08-29 DIAGNOSIS — I10 Essential (primary) hypertension: Secondary | ICD-10-CM | POA: Diagnosis not present

## 2018-08-29 DIAGNOSIS — E119 Type 2 diabetes mellitus without complications: Secondary | ICD-10-CM | POA: Insufficient documentation

## 2018-08-29 DIAGNOSIS — E7220 Disorder of urea cycle metabolism, unspecified: Secondary | ICD-10-CM | POA: Diagnosis not present

## 2018-08-29 DIAGNOSIS — E871 Hypo-osmolality and hyponatremia: Secondary | ICD-10-CM

## 2018-08-29 LAB — URINALYSIS, ROUTINE W REFLEX MICROSCOPIC
Bilirubin Urine: NEGATIVE
Glucose, UA: 50 mg/dL — AB
KETONES UR: 20 mg/dL — AB
Leukocytes, UA: NEGATIVE
NITRITE: NEGATIVE
Protein, ur: NEGATIVE mg/dL
SPECIFIC GRAVITY, URINE: 1.012 (ref 1.005–1.030)
pH: 6 (ref 5.0–8.0)

## 2018-08-29 LAB — BASIC METABOLIC PANEL
Anion gap: 15 (ref 5–15)
BUN: 11 mg/dL (ref 8–23)
CALCIUM: 9.4 mg/dL (ref 8.9–10.3)
CHLORIDE: 88 mmol/L — AB (ref 98–111)
CO2: 23 mmol/L (ref 22–32)
CREATININE: 0.48 mg/dL (ref 0.44–1.00)
Glucose, Bld: 165 mg/dL — ABNORMAL HIGH (ref 70–99)
Potassium: 3.4 mmol/L — ABNORMAL LOW (ref 3.5–5.1)
SODIUM: 126 mmol/L — AB (ref 135–145)

## 2018-08-29 LAB — HEPATIC FUNCTION PANEL
ALBUMIN: 4.2 g/dL (ref 3.5–5.0)
ALT: 35 U/L (ref 0–44)
AST: 36 U/L (ref 15–41)
Alkaline Phosphatase: 81 U/L (ref 38–126)
Bilirubin, Direct: 0.7 mg/dL — ABNORMAL HIGH (ref 0.0–0.2)
Indirect Bilirubin: 4.1 mg/dL — ABNORMAL HIGH (ref 0.3–0.9)
TOTAL PROTEIN: 7.4 g/dL (ref 6.5–8.1)
Total Bilirubin: 4.8 mg/dL — ABNORMAL HIGH (ref 0.3–1.2)

## 2018-08-29 LAB — CBC
HCT: 39.9 % (ref 36.0–46.0)
Hemoglobin: 14 g/dL (ref 12.0–15.0)
MCH: 29.2 pg (ref 26.0–34.0)
MCHC: 35.1 g/dL (ref 30.0–36.0)
MCV: 83.3 fL (ref 80.0–100.0)
NRBC: 0 % (ref 0.0–0.2)
Platelets: 158 10*3/uL (ref 150–400)
RBC: 4.79 MIL/uL (ref 3.87–5.11)
RDW: 13.8 % (ref 11.5–15.5)
WBC: 5.3 10*3/uL (ref 4.0–10.5)

## 2018-08-29 LAB — MAGNESIUM: MAGNESIUM: 2 mg/dL (ref 1.7–2.4)

## 2018-08-29 LAB — AMMONIA: AMMONIA: 53 umol/L — AB (ref 9–35)

## 2018-08-29 MED ORDER — LACTATED RINGERS IV BOLUS
1000.0000 mL | Freq: Once | INTRAVENOUS | Status: AC
  Administered 2018-08-29: 1000 mL via INTRAVENOUS

## 2018-08-29 MED ORDER — LACTULOSE 10 GM/15ML PO SOLN: 10.0000 g | Freq: Two times a day (BID) | ORAL | 0 refills | Status: AC

## 2018-08-29 MED ORDER — SODIUM CHLORIDE 0.9 % IV BOLUS: 1000.0000 mL | Freq: Once | INTRAVENOUS | Status: DC

## 2018-08-29 NOTE — ED Notes (Signed)
Pt ambulated to the BR with steady gait and one assist.  SHe is A&O x 4, in NAD.  Family is at bedside.  Reason for delay explained to pt.  Verbalized understanding. Denies any complaints.

## 2018-08-29 NOTE — ED Triage Notes (Addendum)
Patient here from PCP with complaints of UTI, verified at office. Slight confusion, "slow processing". Denies n/v. Reports that she has not taken BP meds today. Reports hx of portal hypertension.

## 2018-08-29 NOTE — ED Notes (Signed)
Pt states she has not been compliant in a "cpl days" with BP management. RN aware of pts elevated vitals

## 2018-08-29 NOTE — ED Provider Notes (Signed)
Emergency Department Provider Note   I have reviewed the triage vital signs and the nursing notes.   HISTORY  Chief Complaint Urinary Tract Infection   HPI Amber Crosby is a 72 y.o. female with past medical problems as documented below the presents to the emergency department today for altered mental status.  Patient is with her husband and 2 daughters.  Sound like she has had cough and congestion for the last 10 days but then over the last 3 to 4 days she is had progressively worsening confusion which is abnormal for her.  She has a history of portal hypertension is being treated for that with Eliquis down at [facility] or [place].  She has not taken her blood pressure medications in the last few days which is abnormal for her.  She had abnormal confusion, decreased energy and difficulty writing how she normally would on her Christmas cards.  No fevers that the family knows of.  No falls the family knows of.  No other associated symptoms.  Patient denies any headache, tingling, numbness or weakness.  She went to her primary care office where they did a dipstick urine was malodorous with abnormal color and told her she had a really bad urinary tract infection that we could help her here. No other associated or modifying symptoms.    Past Medical History:  Diagnosis Date  . Anxiety   . Colon cancer [facility])    Without evidence of recurrence  . Diabetes mellitus    Type 2  . Ear infection   . Essential hypertension   . Hemorrhoids    Internal-external small hemorrhoids  . Hypercholesterolemia     Patient Active Problem List   Diagnosis Date Noted  . Portal hypertension with esophageal varices (HCC) 04/20/2014  . Cold intolerance 08/27/2013  . Unspecified deficiency anemia 03/28/2012  . Colon cancer ([place]) 03/28/2012  . Type II or unspecified type diabetes mellitus without mention of complication, uncontrolled 07/30/2011  . Benign hypertensive heart disease without heart failure  07/30/2011  . Pure hypercholesterolemia 07/30/2011    Past Surgical History:  Procedure Laterality Date  . ESOPHAGOGASTRODUODENOSCOPY   08/04/2010  . VAGINAL HYSTERECTOMY  1979    Current Outpatient Rx  . Order #: [id number] Class: Print  . Order #: [id number] Class: Normal  . Order #: [id number] Class: Historical Med  . Order #: [id number] Class: Historical Med  . Order #: [id number] Class: Normal  . Order #: [id number] Class: Historical Med  . Order #: [id number] Class: Historical Med  . Order #: [id number] Class: Historical Med  . Order #: [id number] Class: Normal  . Order #: [id number] Class: Print    Allergies Meperidine and related; Contrast media [iodinated diagnostic agents]; Iohexol; and Morphine and related  Family History  Problem Relation Age of Onset  . Hypertension Mother   . Cancer Mother   . Diabetes Mother     Social History Social History   Tobacco Use  . Smoking status: Never Smoker  . Smokeless tobacco: Never Used  Substance Use Topics  . Alcohol use: No  . Drug use: No    Review of Systems  All other systems negative except as documented in the HPI. All pertinent positives and negatives as reviewed in the HPI. ____________________________________________   PHYSICAL EXAM:  VITAL SIGNS: ED Triage Vitals [08/29/18 1411]  Enc Vitals Group     BP (!) 188/90     Pulse Rate 67     Resp 14     Temp 98.2 F (36.8 C)  Temp Source Oral     SpO2 98 %     Weight 144 lb (65.3 kg)     Height 5\' 2"  (1.575 m)    Constitutional: Alert and oriented to person and place but not time. Well appearing and in no acute distress. Eyes: Conjunctivae are normal. PERRL. EOMI. [place]: Atraumatic. Nose: No congestion/rhinnorhea. Mouth/Throat: Mucous membranes are dry.  Oropharynx non-erythematous. Neck: No stridor.  No meningeal signs.   Cardiovascular: Normal rate, regular rhythm. Good peripheral circulation. Grossly normal heart sounds.   Respiratory: Normal  respiratory effort.  No retractions. Lungs crackles in RUL. Gastrointestinal: Soft and nontender. No distention.  Musculoskeletal: No lower extremity tenderness nor edema. No gross deformities of extremities. Neurologic:  Normal speech and language. No gross focal neurologic deficits are appreciated.  Skin:  Skin is warm, dry and intact. No rash noted.   ____________________________________________   LABS (all labs ordered are listed, but only abnormal results are displayed)  Labs Reviewed  URINALYSIS, ROUTINE W REFLEX MICROSCOPIC - Abnormal; Notable for the following components:      Result Value   APPearance HAZY (*)    Glucose, UA 50 (*)    Hgb urine dipstick MODERATE (*)    Ketones, ur 20 (*)    Bacteria, UA RARE (*)    All other components within normal limits  BASIC METABOLIC PANEL - Abnormal; Notable for the following components:   Sodium 126 (*)    Potassium 3.4 (*)    Chloride 88 (*)    Glucose, Bld 165 (*)    All other components within normal limits  AMMONIA - Abnormal; Notable for the following components:   Ammonia 53 (*)    All other components within normal limits  HEPATIC FUNCTION PANEL - Abnormal; Notable for the following components:   Total Bilirubin 4.8 (*)    Bilirubin, Direct 0.7 (*)    Indirect Bilirubin 4.1 (*)    All other components within normal limits  URINE CULTURE  CBC  MAGNESIUM   ____________________________________________  EKG   EKG Interpretation  Date/Time:    Ventricular Rate:    PR Interval:    QRS Duration:   QT Interval:    QTC Calculation:   R Axis:     Text Interpretation:         ____________________________________________  RADIOLOGY  No results found.  ____________________________________________   PROCEDURES  Procedure(s) performed:   Procedures   ____________________________________________   INITIAL IMPRESSION / ASSESSMENT AND PLAN / ED COURSE  We will evaluate for pneumonia, hyperammonemia,  urinary tract infection or metabolic abnormalities such as acute kidney injury secondary to her obvious dehydration on clinical exam. Start fluids in the meantime. Work-up some mild abnormalities but nothing to explain her symptoms..  Blood pressure improved significantly.  Her mental status apparently totally cleared up with some fluids.  I suspect it was probably combination of the hyponatremia and mild elevation in her ammonia she will follow-up with her [facility] doctors tomorrow.  Return here for any worsening symptoms.  Will take lactulose tomorrow if they instruct her to do so.     Pertinent labs & imaging results that were available during my care of the patient were reviewed by me and considered in my medical decision making (see chart for details).  ____________________________________________  FINAL CLINICAL IMPRESSION(S) / ED DIAGNOSES  Final diagnoses:  Hyponatremia  Hyperbilirubinemia  Hyperammonemia (HCC)     MEDICATIONS GIVEN DURING THIS VISIT:  Medications  lactated ringers bolus 1,000  mL (0 mLs Intravenous Stopped 08/29/18 1918)     NEW OUTPATIENT MEDICATIONS STARTED DURING THIS VISIT:  Discharge Medication List as of 08/29/2018  8:10 PM    START taking these medications   Details  lactulose (CONSTULOSE) 10 GM/15ML solution Take 15 mLs (10 g total) by mouth 2 (two) times daily for 1 day., Starting Tue 08/29/2018, Until Wed 08/30/2018, Print        Note:  This note was prepared with assistance of Dragon voice recognition software. Occasional wrong-word or sound-a-like substitutions may have occurred due to the inherent limitations of voice recognition software.   Zaeem Kandel, Corene Cornea, MD 08/30/18 [phone]

## 2018-08-29 NOTE — Discharge Instructions (Addendum)
Your sodium level was 126 Your bilirubin was 4.8 Potassium was 3.4 Ammonia was 56  These call your doctors tomorrow and follow-up whether not they want to take the lactulose and how they want a follow-up your labs.  Please return here if the confusion returns, weakness returns, fever, skin color changes or other abnormalities.

## 2018-08-31 LAB — URINE CULTURE: Culture: >=100000 — AB

## 2018-09-01 ENCOUNTER — Telehealth: Payer: Self-pay

## 2018-09-01 NOTE — Progress Notes (Signed)
ED Antimicrobial Stewardship Positive Culture Follow Up   Amber Crosby is an 72 y.o. female who presented to [facility] on 08/29/2018 with a chief complaint of  Chief Complaint  Patient presents with  . Urinary Tract Infection    Recent Results (from the past 720 hour(s))  Urine C&S     Status: Abnormal   Collection Time: 08/29/18  4:21 PM  Result Value Ref Range Status   Specimen Description   Final    URINE, CLEAN CATCH Performed at [facility] [address] [place], [place] [address]    Special Requests   Final    NONE Performed at [facility], [place] [address], [place], [place] [address]    Culture >=100,000 COLONIES/mL KLEBSIELLA PNEUMONIAE (A)  Final   Report Status 08/31/2018 FINAL  Final   Organism ID, Bacteria KLEBSIELLA PNEUMONIAE (A)  Final      Susceptibility   Klebsiella pneumoniae - MIC*    AMPICILLIN RESISTANT Resistant     CEFAZOLIN <=4 SENSITIVE Sensitive     CEFTRIAXONE <=1 SENSITIVE Sensitive     CIPROFLOXACIN <=0.25 SENSITIVE Sensitive     GENTAMICIN <=1 SENSITIVE Sensitive     IMIPENEM <=0.25 SENSITIVE Sensitive     NITROFURANTOIN <=16 SENSITIVE Sensitive     TRIMETH/SULFA <=20 SENSITIVE Sensitive     AMPICILLIN/SULBACTAM <=2 SENSITIVE Sensitive     PIP/TAZO <=4 SENSITIVE Sensitive     Extended ESBL NEGATIVE Sensitive     * >=100,000 COLONIES/mL KLEBSIELLA PNEUMONIAE    []  Treated with N/A, organism resistant to prescribed antimicrobial [x]  Patient discharged originally without antimicrobial agent and treatment is now indicated  New antibiotic prescription: If pt with UTI symptoms, start cephalexin 500mg  PO BID x 5 days. Follow-up with PCP by 12/17  ED Provider: Alferd Apa, PA   [name], [name] 09/01/2018, 8:37 AM Clinical Pharmacist Monday - Friday phone -  [phone] Saturday - Sunday phone - [phone]

## 2018-09-01 NOTE — Telephone Encounter (Signed)
Called for symptom check for [facility] ED  08/29/18  Saw PCP yesterday and started on Abx Keflex

## 2018-12-06 ENCOUNTER — Other Ambulatory Visit: Payer: Self-pay | Admitting: Cardiovascular Disease

## 2018-12-07 ENCOUNTER — Other Ambulatory Visit: Payer: Self-pay | Admitting: Cardiovascular Disease

## 2018-12-07 NOTE — Telephone Encounter (Signed)
Spoke with patient and advised her to contact her pcp to have her diabetic medication refilled. She voiced understanding.

## 2018-12-07 NOTE — Telephone Encounter (Signed)
°*  STAT* If patient is at the pharmacy, call can be transferred to refill team.   1. Which medications need to be refilled? (please list name of each medication and dose if known) Metformin 500 mg  2. Which pharmacy/location (including street and city if local pharmacy) is medication to be sent to? [facility]   3. Do they need a 30 day or 90 day supply? [place]

## 2018-12-20 ENCOUNTER — Other Ambulatory Visit: Payer: Self-pay

## 2018-12-20 ENCOUNTER — Telehealth: Payer: Self-pay | Admitting: Cardiovascular Disease

## 2018-12-20 NOTE — Telephone Encounter (Signed)
This can be done as a video visit

## 2018-12-20 NOTE — Telephone Encounter (Signed)
° °  Patient calling to verify if she can do a TELE visit on 4/2 or reschedule to a later date. Please call

## 2018-12-20 NOTE — Telephone Encounter (Signed)
Patient will be phone visit tomorrow.

## 2018-12-21 ENCOUNTER — Ambulatory Visit: Payer: Medicare Other | Admitting: Cardiovascular Disease

## 2018-12-21 ENCOUNTER — Telehealth (INDEPENDENT_AMBULATORY_CARE_PROVIDER_SITE_OTHER): Payer: Medicare Other | Admitting: Cardiovascular Disease

## 2018-12-21 VITALS — BP 113/54 | HR 72 | Wt 145.0 lb

## 2018-12-21 DIAGNOSIS — E7800 Pure hypercholesterolemia, unspecified: Secondary | ICD-10-CM

## 2018-12-21 DIAGNOSIS — I10 Essential (primary) hypertension: Secondary | ICD-10-CM

## 2018-12-21 NOTE — Progress Notes (Signed)
Virtual Visit via Telephone Note    Evaluation Performed:  Follow-up visit  This visit type was conducted due to national recommendations for restrictions regarding the COVID-19 Pandemic (e.g. social distancing).  This format is felt to be most appropriate for this patient at this time.  All issues noted in this document were discussed and addressed.  No physical exam was performed (except for noted visual exam findings with Video Visits).  Please refer to the patient's chart (MyChart message for video visits and phone note for telephone visits) for the patient's consent to telehealth for [facility].  Date:  12/21/2018   ID:  Amber Crosby, DOB 16-Nov-1945, MRN [id number]  Patient Location:  home  Provider location:   office  PCP:  Vernie Shanks, MD  Cardiologist:  Skeet Latch, MD  Electrophysiologist:  None   Chief Complaint:  Follow up  History of Present Illness:    Amber Crosby is a 73 y.o. female who presents via audio/video conferencing for a telehealth visit today.    Amber Crosby is a 73 y.o. female with diabetes mellitus type 2, hypertension, hyperlipidemia, esophageal varices, portal vein thrombosis, and portal hypertension 2/2 chemotherapy who presents for follow up.  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.  She has some weakness in her left leg since taking chemotherapy in 2006.  She developed melena and was found to have esophageal varices in 2011.  This was also thought to be a result of the chemotherapy.  Amber Crosby was previously taking rosuvastatin daily but reduced it to every other day because of concern for potential memory deficits.  She was not experiencing any problems at the time.    Amber Crosby saw [name] who referred her for an abdominal ultrasound 08/2017 that revealed a portal vein thrombus.  She was also noted to have an area of nodular echogenicity and MRI was ordered.  No mass was noted on her MRI 09/2017.  She  followed up with [name] (liver transplant at [facility]) and was started on Eliquis 03/2018.  Follow up imaging showed that the clot was stable but there was no regression.  Her doctor has since left the clinic and she will now see [name].  She is unsure whether she will remain on Eliquis.  She has surveillance MRIs every 4 months and her liver has been stable.  She was treated for a severe UTI 08/2018 and had hyponatremia.  She received IV fluids and had repeat labs with her PCP normalized.  Last week she noted some mild ankle edema after working in her yard.  She noted some bug bites and pruritis in that area.  There was no associated chest pain, shortness of breath, orthopnea or PND.  She hasn't been exercising 2/2 COVID-19 and has gained 4lb in the last several weeks.  She hs been stressed lately because her husband was recently diagnosed with bladder cancer.  They are waiting   The patient does not have symptoms concerning for COVID-19 infection (fever, chills, cough, or new shortness of breath).    Prior CV studies:   The following studies were reviewed today:  n/a  Past Medical History:  Diagnosis Date  . Anxiety   . Colon cancer [facility])    Without evidence of recurrence  . Diabetes mellitus    Type 2  . Ear infection   . Essential hypertension   . Hemorrhoids    Internal-external  small hemorrhoids  . Hypercholesterolemia    Past Surgical History:  Procedure Laterality Date  . ESOPHAGOGASTRODUODENOSCOPY   08/04/2010  . VAGINAL HYSTERECTOMY  1979     No outpatient medications have been marked as taking for the 12/21/18 encounter (Appointment) with Skeet Latch, MD.     Allergies:   Meperidine and related; Contrast media [iodinated diagnostic agents]; Iohexol; and Morphine and related   Social History   Tobacco Use  . Smoking status: Never Smoker  . Smokeless tobacco: Never Used  Substance Use Topics  . Alcohol use: No  . Drug use: No     Family Hx:  The patient's family history includes Cancer in her mother; Diabetes in her mother; Hypertension in her mother.  ROS:   Please see the history of present illness.     All other systems reviewed and are negative.   Labs/Other Tests and Data Reviewed:    Recent Labs: 08/29/2018: ALT 35; BUN 11; Creatinine, Ser 0.48; Hemoglobin 14.0; Magnesium 2.0; Platelets 158; Potassium 3.4; Sodium 126   Recent Lipid Panel Lab Results  Component Value Date/Time   CHOL 129 03/03/2018 09:26 AM   TRIG 47 03/03/2018 09:26 AM   HDL 60 03/03/2018 09:26 AM   CHOLHDL 2.2 03/03/2018 09:26 AM   CHOLHDL 2.8 05/18/2016 10:29 AM   LDLCALC 60 03/03/2018 09:26 AM    Wt Readings from Last 3 Encounters:  08/29/18 144 lb (65.3 kg)  12/05/17 153 lb 9.6 oz (69.7 kg)  12/16/16 150 lb (68 kg)     Objective:    BP (!) 113/54   Pulse 72   Wt 145 lb (65.8 kg)   BMI 26.52 kg/m  GENERAL:  No acute distress. HEENT: Pupils equal round.  Oral mucosa unremarkable NEURO:  Speech fluent.  PSYCH:  Cognitively intact, oriented to person place and time   ASSESSMENT & PLAN:    # Hypertension: Blood pressure remains well controlled on amlodipine, losartan, HCTZ, and nadolol.  # Hyperlipidemia: Lipids well-controlled. LDL was 60 on 02/2018.  Continue rosuvastatin 5 mg daily.  # Portal vein thrombosis: Stable.  Followed by GI at [facility].  She is on Eliquis and is unsure if this will be continued long term.    COVID-19 Education: The signs and symptoms of COVID-19 were discussed with the patient and how to seek care for testing (follow up with PCP or arrange E-visit).  The importance of social distancing was discussed today.  Patient Risk:   After full review of this patient's clinical status, I feel that they are at least moderate risk at this time.   Time:   Today, I have spent 22 minutes with the patient with telehealth technology discussing hypertension, COVID-19, stress.     Medication Adjustments/Labs and  Tests Ordered: Current medicines are reviewed at length with the patient today.  Concerns regarding medicines are outlined above.  Tests Ordered: No orders of the defined types were placed in this encounter.  Medication Changes: No orders of the defined types were placed in this encounter.   Disposition:  Follow up in 1 year  Signed, Skeet Latch, MD  12/21/2018 8:43 AM    [place]

## 2018-12-21 NOTE — Patient Instructions (Addendum)
Medication Instructions:  Your physician recommends that you continue on your current medications as directed. Please refer to the Current Medication list given to you today.  If you need a refill on your cardiac medications before your next appointment, please call your pharmacy.   Lab work: HAVE YOUR PRIMARY CARE SEND DR [employer] COPY OF LABS WHEN YOU HAVE THEM DONE THIS SUMMER  Testing/Procedures: NONE  Follow-Up: At [facility], you and your health needs are our priority.  As part of our continuing mission to provide you with exceptional heart care, we have created designated Provider Care Teams.  These Care Teams include your primary Cardiologist (physician) and Advanced Practice Providers (APPs -  Physician Assistants and Nurse Practitioners) who all work together to provide you with the care you need, when you need it. You will need a follow up appointment in 12 months.  Please call our office 2 months in advance to schedule this appointment.  You may see Skeet Latch, MD or one of the following Advanced Practice Providers on your designated Care Team:   Kerin Ransom, PA-C Roby Lofts, [place] . Sande Rives, PA-C

## 2019-03-12 ENCOUNTER — Other Ambulatory Visit: Payer: Self-pay | Admitting: Cardiovascular Disease

## 2019-03-29 ENCOUNTER — Other Ambulatory Visit: Payer: Self-pay | Admitting: Cardiovascular Disease

## 2019-10-29 ENCOUNTER — Ambulatory Visit: Payer: Medicare Other

## 2019-11-17 ENCOUNTER — Ambulatory Visit: Payer: Medicare Other

## 2019-11-28 ENCOUNTER — Other Ambulatory Visit: Payer: Self-pay | Admitting: Cardiovascular Disease

## 2019-12-27 ENCOUNTER — Other Ambulatory Visit: Payer: Self-pay | Admitting: Cardiovascular Disease

## 2020-01-29 ENCOUNTER — Encounter: Payer: Self-pay | Admitting: Cardiovascular Disease

## 2020-01-29 ENCOUNTER — Other Ambulatory Visit: Payer: Self-pay

## 2020-01-29 ENCOUNTER — Ambulatory Visit: Payer: Medicare Other | Admitting: Cardiovascular Disease

## 2020-01-29 VITALS — BP 146/72 | HR 65 | Ht 62.0 in | Wt 158.4 lb

## 2020-01-29 DIAGNOSIS — I119 Hypertensive heart disease without heart failure: Secondary | ICD-10-CM | POA: Diagnosis not present

## 2020-01-29 DIAGNOSIS — K766 Portal hypertension: Secondary | ICD-10-CM

## 2020-01-29 DIAGNOSIS — I8500 Esophageal varices without bleeding: Secondary | ICD-10-CM

## 2020-01-29 DIAGNOSIS — E7800 Pure hypercholesterolemia, unspecified: Secondary | ICD-10-CM | POA: Diagnosis not present

## 2020-01-29 DIAGNOSIS — I81 Portal vein thrombosis: Secondary | ICD-10-CM | POA: Insufficient documentation

## 2020-01-29 HISTORY — DX: Portal vein thrombosis: I81

## 2020-01-29 NOTE — Progress Notes (Signed)
Cardiology Office Note    Evaluation Performed:  Follow-up visit  Date:  01/29/2020   ID:  Amber Crosby, DOB November 29, 1945, MRN [id number]  PCP:  Vernie Shanks, MD  Cardiologist:  Skeet Latch, MD  Electrophysiologist:  None   Chief Complaint:  Follow up  History of Present Illness:    Amber Crosby is a 74 y.o. female 74 y.o. female with diabetes mellitus type 2, hypertension, hyperlipidemia, esophageal varices, portal vein thrombosis, and portal hypertension 2/2 chemotherapy who presents for follow up.  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.  She has some weakness in her left leg since taking chemotherapy in 2006.  She developed melena and was found to have esophageal varices in 2011.  This was also thought to be a result of the chemotherapy.  Amber Crosby was previously taking rosuvastatin daily but reduced it to every other day because of concern for potential memory deficits.  She was not experiencing any problems at the time.    Amber Crosby saw [name] who referred her for an abdominal ultrasound 08/2017 that revealed a portal vein thrombus.  She was also noted to have an area of nodular echogenicity and MRI was ordered.  No mass was noted on her MRI 09/2017.  She followed up with [name] (liver transplant at [facility]) and was started on Eliquis 03/2018.  Follow up imaging showed that the clot was stable but there was no regression.  Her doctor has since left the clinic and she will now see [name].  She is unsure whether she will remain on Eliquis.  She has surveillance MRIs every 4 months and her liver has been stable.  She was treated for a severe UTI 08/2018 and had hyponatremia.  She received IV fluids and had repeat labs with her PCP normalized.  Since her last appointment her husband had a mild stroke. He became transiently aphasic.  Fortunately his symptoms have resolved.  He has been depressed and going downhill.  She is stressed  from having to with dealing with that.  She continues to follow up for her varices at [facility] and they have been stable.  She has a small cyst on her liver that they are watching.  Her portal vein thrombosis is stable and has not changed with time.  She continues to remain active.  She has no chest pain or shortness of breath.  She denies lower extremity edema, orthopnea or PND. She wants to start a walking program but is afraid to leave her husband at home alone.     Past Medical History:  Diagnosis Date  . Anxiety   . Colon cancer [facility])    Without evidence of recurrence  . Diabetes mellitus    Type 2  . Ear infection   . Essential hypertension   . Hemorrhoids    Internal-external small hemorrhoids  . Hypercholesterolemia   . Portal vein thrombosis 01/29/2020   Past Surgical History:  Procedure Laterality Date  . ESOPHAGOGASTRODUODENOSCOPY   08/04/2010  . VAGINAL HYSTERECTOMY  1979     Current Meds  Medication Sig  . ALPRAZolam (XANAX) 0.25 MG tablet Take 1 tablet (0.25 mg total) by mouth at bedtime as needed for anxiety.  [name] amLODipine (NORVASC) 5 MG tablet Take 1 tablet (5 mg total) by mouth daily.  [name] apixaban (ELIQUIS) 5 MG TABS tablet Take 5 mg by mouth 2 (two) times daily.  . Cholecalciferol (VITAMIN  D PO) Take 1 tablet by mouth daily.   [name] losartan-hydrochlorothiazide (HYZAAR) 100-12.5 MG tablet TAKE 1 TABLET BY MOUTH EVERY DAY  . metFORMIN (GLUCOPHAGE) 500 MG tablet Take 1,000 mg by mouth daily with breakfast.   . nadolol (CORGARD) 40 MG tablet Take 120 mg by mouth daily.   . ondansetron (ZOFRAN) 8 MG tablet Take 8 mg by mouth every 8 (eight) hours as needed for nausea or vomiting.  . rosuvastatin (CRESTOR) 5 MG tablet TAKE 1 TABLET BY MOUTH EVERY DAY     Allergies:   Meperidine and related, Other, Contrast media [iodinated diagnostic agents], Iohexol, and Morphine and related   Social History   Tobacco Use  . Smoking status: Never Smoker  . Smokeless tobacco: Never Used    Substance Use Topics  . Alcohol use: No  . Drug use: No     Family Hx: The patient's family history includes Cancer in her mother; Diabetes in her mother; Hypertension in her mother.  ROS:   Please see the history of present illness.     All other systems reviewed and are negative.  Prior CV studies:   The following studies were reviewed today:  EKG 01/29/20:  Sinus rhythm.  Rate 65 bpm.  Labs/Other Tests and Data Reviewed:    Recent Labs: No results found for requested labs within last 8760 hours.   Recent Lipid Panel Lab Results  Component Value Date/Time   CHOL 129 03/03/2018 09:26 AM   TRIG 47 03/03/2018 09:26 AM   HDL 60 03/03/2018 09:26 AM   CHOLHDL 2.2 03/03/2018 09:26 AM   CHOLHDL 2.8 05/18/2016 10:29 AM   LDLCALC 60 03/03/2018 09:26 AM    Wt Readings from Last 3 Encounters:  01/29/20 158 lb 6.4 oz (71.8 kg)  12/21/18 145 lb (65.8 kg)  08/29/18 144 lb (65.3 kg)     Objective:    VS:  BP (!) 146/72   Pulse 65   Ht 5\' 2"  (1.575 m)   Wt 158 lb 6.4 oz (71.8 kg)   BMI 28.97 kg/m  , BMI Body mass index is 28.97 kg/m. GENERAL:  Well appearing HEENT: Pupils equal round and reactive, fundi not visualized, oral mucosa unremarkable NECK:  No jugular venous distention, waveform within normal limits, carotid upstroke brisk and symmetric, no bruits, no thyromegaly LYMPHATICS:  No cervical adenopathy LUNGS:  Clear to auscultation bilaterally HEART:  RRR.  PMI not displaced or sustained,S1 and S2 within normal limits, no S3, no S4, no clicks, no rubs, no murmurs ABD:  Flat, positive bowel sounds normal in frequency in pitch, no bruits, no rebound, no guarding, no midline pulsatile mass, no hepatomegaly, no splenomegaly EXT:  2 plus pulses throughout, no edema, no cyanosis no clubbing SKIN:  No rashes no nodules NEURO:  Cranial nerves II through XII grossly intact, motor grossly intact throughout PSYCH:  Cognitively intact, oriented to person place and  time   ASSESSMENT & PLAN:    # Hypertension:  Blood pressure is poorly controlled poorly controlled today.  She thinks it has been controlled at home.  She is going to track it and increase her exercise to 150 minutes weekly.  Continue amlodipine, losartan, and hydrochlorothiazide for now.  # Hyperlipidemia:  She will get fasting lab work with her PCP next week.  Continue rosuvastatin.  # Portal vein thrombosis: Stable.  Followed by GI at [facility].  She continues on Eliquis.    Medication Adjustments/Labs and Tests Ordered: Current medicines are reviewed at length  with the patient today.  Concerns regarding medicines are outlined above.   Tests Ordered: No orders of the defined types were placed in this encounter.  Medication Changes: No orders of the defined types were placed in this encounter.   Disposition:  Follow up in 3 month(s)  Signed, Skeet Latch, MD  01/29/2020 12:14 PM    [facility]

## 2020-01-29 NOTE — Patient Instructions (Signed)
Medication Instructions:  Your physician recommends that you continue on your current medications as directed. Please refer to the Current Medication list given to you today.  *If you need a refill on your cardiac medications before your next appointment, please call your pharmacy*  Lab Work: NONE   Testing/Procedures: NONE   Follow-Up: At [facility], you and your health needs are our priority.  As part of our continuing mission to provide you with exceptional heart care, we have created designated Provider Care Teams.  These Care Teams include your primary Cardiologist (physician) and Advanced Practice Providers (APPs -  Physician Assistants and Nurse Practitioners) who all work together to provide you with the care you need, when you need it.  We recommend signing up for the patient portal called "MyChart".  Sign up information is provided on this After Visit Summary.  MyChart is used to connect with patients for Virtual Visits (Telemedicine).  Patients are able to view lab/test results, encounter notes, upcoming appointments, etc.  Non-urgent messages can be sent to your provider as well.   To learn more about what you can do with MyChart, go to [email or web address].    Your next appointment:   3 month(s)  The format for your next appointment:   In Person  Provider:   You may see Skeet Latch, MD or one of the following Advanced Practice Providers on your designated Care Team:    Kerin Ransom, PA-C  [place], [place]  Coletta Memos, FNP    Other Instructions MONITOR AND LOG YOUR BLOOD PRESSURE AT HOME, BRING TO YOUR FOLLOW UP   TRY TO EXERCISE [place]

## 2020-03-14 ENCOUNTER — Other Ambulatory Visit: Payer: Self-pay | Admitting: Cardiovascular Disease

## 2020-05-13 ENCOUNTER — Ambulatory Visit: Payer: Medicare Other | Admitting: Cardiovascular Disease

## 2020-05-15 ENCOUNTER — Ambulatory Visit: Payer: Medicare Other | Admitting: Cardiovascular Disease

## 2020-10-09 DIAGNOSIS — M8580 Other specified disorders of bone density and structure, unspecified site: Secondary | ICD-10-CM | POA: Diagnosis not present

## 2020-10-09 DIAGNOSIS — Z85038 Personal history of other malignant neoplasm of large intestine: Secondary | ICD-10-CM | POA: Diagnosis not present

## 2020-10-09 DIAGNOSIS — E1169 Type 2 diabetes mellitus with other specified complication: Secondary | ICD-10-CM | POA: Diagnosis not present

## 2020-10-09 DIAGNOSIS — E7800 Pure hypercholesterolemia, unspecified: Secondary | ICD-10-CM | POA: Diagnosis not present

## 2020-10-09 DIAGNOSIS — E119 Type 2 diabetes mellitus without complications: Secondary | ICD-10-CM | POA: Diagnosis not present

## 2020-10-09 DIAGNOSIS — I10 Essential (primary) hypertension: Secondary | ICD-10-CM | POA: Diagnosis not present

## 2020-10-09 DIAGNOSIS — D500 Iron deficiency anemia secondary to blood loss (chronic): Secondary | ICD-10-CM | POA: Diagnosis not present

## 2020-11-12 DIAGNOSIS — H52203 Unspecified astigmatism, bilateral: Secondary | ICD-10-CM | POA: Diagnosis not present

## 2020-11-12 DIAGNOSIS — H40012 Open angle with borderline findings, low risk, left eye: Secondary | ICD-10-CM | POA: Diagnosis not present

## 2020-11-12 DIAGNOSIS — E119 Type 2 diabetes mellitus without complications: Secondary | ICD-10-CM | POA: Diagnosis not present

## 2020-11-12 DIAGNOSIS — H2513 Age-related nuclear cataract, bilateral: Secondary | ICD-10-CM | POA: Diagnosis not present

## 2020-11-26 DIAGNOSIS — E119 Type 2 diabetes mellitus without complications: Secondary | ICD-10-CM | POA: Diagnosis not present

## 2020-11-26 DIAGNOSIS — Z85038 Personal history of other malignant neoplasm of large intestine: Secondary | ICD-10-CM | POA: Diagnosis not present

## 2020-11-26 DIAGNOSIS — M8580 Other specified disorders of bone density and structure, unspecified site: Secondary | ICD-10-CM | POA: Diagnosis not present

## 2020-11-26 DIAGNOSIS — D500 Iron deficiency anemia secondary to blood loss (chronic): Secondary | ICD-10-CM | POA: Diagnosis not present

## 2020-11-26 DIAGNOSIS — I10 Essential (primary) hypertension: Secondary | ICD-10-CM | POA: Diagnosis not present

## 2020-11-26 DIAGNOSIS — E1169 Type 2 diabetes mellitus with other specified complication: Secondary | ICD-10-CM | POA: Diagnosis not present

## 2020-11-26 DIAGNOSIS — E7800 Pure hypercholesterolemia, unspecified: Secondary | ICD-10-CM | POA: Diagnosis not present

## 2020-12-26 ENCOUNTER — Other Ambulatory Visit: Payer: Self-pay | Admitting: Cardiovascular Disease

## 2021-01-03 ENCOUNTER — Emergency Department (HOSPITAL_BASED_OUTPATIENT_CLINIC_OR_DEPARTMENT_OTHER)
Admission: EM | Admit: 2021-01-03 | Discharge: 2021-01-03 | Disposition: A | Discharge status: Home / Self Care | Payer: Medicare Other | Attending: Emergency Medicine | Admitting: Emergency Medicine

## 2021-01-03 ENCOUNTER — Encounter (HOSPITAL_BASED_OUTPATIENT_CLINIC_OR_DEPARTMENT_OTHER): Payer: Self-pay | Admitting: *Deleted

## 2021-01-03 ENCOUNTER — Other Ambulatory Visit: Payer: Self-pay

## 2021-01-03 DIAGNOSIS — N3000 Acute cystitis without hematuria: Secondary | ICD-10-CM | POA: Insufficient documentation

## 2021-01-03 DIAGNOSIS — Z7984 Long term (current) use of oral hypoglycemic drugs: Secondary | ICD-10-CM | POA: Insufficient documentation

## 2021-01-03 DIAGNOSIS — K529 Noninfective gastroenteritis and colitis, unspecified: Secondary | ICD-10-CM | POA: Diagnosis not present

## 2021-01-03 DIAGNOSIS — I119 Hypertensive heart disease without heart failure: Secondary | ICD-10-CM | POA: Diagnosis not present

## 2021-01-03 DIAGNOSIS — R112 Nausea with vomiting, unspecified: Secondary | ICD-10-CM | POA: Diagnosis present

## 2021-01-03 DIAGNOSIS — Z7901 Long term (current) use of anticoagulants: Secondary | ICD-10-CM | POA: Insufficient documentation

## 2021-01-03 DIAGNOSIS — R197 Diarrhea, unspecified: Secondary | ICD-10-CM | POA: Diagnosis not present

## 2021-01-03 DIAGNOSIS — Z85038 Personal history of other malignant neoplasm of large intestine: Secondary | ICD-10-CM | POA: Diagnosis not present

## 2021-01-03 DIAGNOSIS — E119 Type 2 diabetes mellitus without complications: Secondary | ICD-10-CM | POA: Diagnosis not present

## 2021-01-03 DIAGNOSIS — Z79899 Other long term (current) drug therapy: Secondary | ICD-10-CM | POA: Diagnosis not present

## 2021-01-03 DIAGNOSIS — Z743 Need for continuous supervision: Secondary | ICD-10-CM | POA: Diagnosis not present

## 2021-01-03 DIAGNOSIS — R1111 Vomiting without nausea: Secondary | ICD-10-CM | POA: Diagnosis not present

## 2021-01-03 DIAGNOSIS — R110 Nausea: Secondary | ICD-10-CM | POA: Diagnosis not present

## 2021-01-03 LAB — URINALYSIS, ROUTINE W REFLEX MICROSCOPIC
Bilirubin Urine: NEGATIVE
Glucose, UA: NEGATIVE mg/dL
Ketones, ur: NEGATIVE mg/dL
Nitrite: POSITIVE — AB
Specific Gravity, Urine: 1.023 (ref 1.005–1.030)
pH: 5.5 (ref 5.0–8.0)

## 2021-01-03 LAB — COMPREHENSIVE METABOLIC PANEL
ALT: 30 U/L (ref 0–44)
AST: 33 U/L (ref 15–41)
Albumin: 3.5 g/dL (ref 3.5–5.0)
Alkaline Phosphatase: 67 U/L (ref 38–126)
Anion gap: 10 (ref 5–15)
BUN: 16 mg/dL (ref 8–23)
CO2: 23 mmol/L (ref 22–32)
Calcium: 8.7 mg/dL — ABNORMAL LOW (ref 8.9–10.3)
Chloride: 99 mmol/L (ref 98–111)
Creatinine, Ser: 0.51 mg/dL (ref 0.44–1.00)
GFR, Estimated: >60 mL/min (ref >60)
Glucose, Bld: 138 mg/dL — ABNORMAL HIGH (ref 70–99)
Potassium: 3.2 mmol/L — ABNORMAL LOW (ref 3.5–5.1)
Sodium: 132 mmol/L — ABNORMAL LOW (ref 135–145)
Total Bilirubin: 3.2 mg/dL — ABNORMAL HIGH (ref 0.3–1.2)
Total Protein: 6.3 g/dL — ABNORMAL LOW (ref 6.5–8.1)

## 2021-01-03 LAB — CBC
HCT: 33.8 % — ABNORMAL LOW (ref 36.0–46.0)
Hemoglobin: 11.7 g/dL — ABNORMAL LOW (ref 12.0–15.0)
MCH: 29.2 pg (ref 26.0–34.0)
MCHC: 34.6 g/dL (ref 30.0–36.0)
MCV: 84.3 fL (ref 80.0–100.0)
Platelets: 94 10*3/uL — ABNORMAL LOW (ref 150–400)
RBC: 4.01 MIL/uL (ref 3.87–5.11)
RDW: 14.8 % (ref 11.5–15.5)
WBC: 6.3 10*3/uL (ref 4.0–10.5)
nRBC: 0 % (ref 0.0–0.2)

## 2021-01-03 LAB — LIPASE, BLOOD: Lipase: 49 U/L (ref 11–51)

## 2021-01-03 MED ORDER — LOPERAMIDE HCL 2 MG PO TABS: 2.0000 mg | ORAL_TABLET | Freq: Four times a day (QID) | ORAL | 0 refills | Status: DC | PRN

## 2021-01-03 MED ORDER — CEPHALEXIN 500 MG PO CAPS: 500.0000 mg | ORAL_CAPSULE | Freq: Four times a day (QID) | ORAL | 0 refills | Status: DC

## 2021-01-03 MED ORDER — ONDANSETRON 4 MG PO TBDP: 4.0000 mg | ORAL_TABLET | Freq: Three times a day (TID) | ORAL | 1 refills | Status: DC | PRN

## 2021-01-03 MED ORDER — ACETAMINOPHEN 325 MG PO TABS
650.0000 mg | ORAL_TABLET | Freq: Once | ORAL | Status: DC
  Filled 2021-01-03: qty 2

## 2021-01-03 MED ORDER — SODIUM CHLORIDE 0.9 % IV BOLUS
1000.0000 mL | Freq: Once | INTRAVENOUS | Status: AC
  Administered 2021-01-03: 1000 mL via INTRAVENOUS

## 2021-01-03 MED ORDER — SODIUM CHLORIDE 0.9 % IV SOLN
1.0000 g | Freq: Once | INTRAVENOUS | Status: AC
  Administered 2021-01-03: 1 g via INTRAVENOUS
  Filled 2021-01-03: qty 10

## 2021-01-03 MED ORDER — ONDANSETRON 4 MG PO TBDP: 4.0000 mg | ORAL_TABLET | Freq: Three times a day (TID) | ORAL | 1 refills | Status: AC | PRN

## 2021-01-03 NOTE — Discharge Instructions (Addendum)
For the urinary tract infection take the antibiotic Keflex as directed for the next 7 days.  It appears that the vomiting and diarrhea has improved significantly.  But in case you needed prescriptions provided for Zofran for the nausea and vomiting and Imodium A-D for the diarrhea.  Recommend you make an appointment with your regular doctor for follow-up later this week to check your urine to make sure that the infection is improving.  Return for any new or worse symptoms

## 2021-01-03 NOTE — ED Notes (Signed)
Urine culture was added on by the lab.

## 2021-01-03 NOTE — ED Triage Notes (Signed)
BIB EMS, stomach bug yesterday with N/V/D. Last time electrolytes were off. 20 IV rt hand 250 NS. CBG 141 100%, 128/70-100

## 2021-01-04 NOTE — ED Provider Notes (Signed)
[facility] EMERGENCY DEPT Provider Note   CSN: [id number] Arrival date & time: 01/03/21  1855     History Chief Complaint  Patient presents with  . Emesis  . Nausea  . Diarrhea    Amber Crosby is a 75 y.o. female.  Patient brought in by family member for nausea vomiting and diarrhea that started yesterday.  Patient had episodes of vomiting and diarrhea.  Has now resolved.  Patient yesterday had a little bit of abdominal discomfort just with vomiting.  No blood in the vomit no blood in the bowel movements.  It is of note that patient does have a low-grade fever 100.3 here tonight.  Oxygen saturation 96%.  Blood pressure 137/57 and heart rate is 77 respirations 17.  Patient has past medical history of diabetes type 2 and hypertension.  Family is concerned because she has been sleeping a lot.  And they think that there is some mild mental confusion.  Last time this occurred her electrolytes were abnormal.  And that is their concern.  Patient has had less than 48 hours of the illness.  And is feeling much better.        Past Medical History:  Diagnosis Date  . Anxiety   . Colon cancer [facility])    Without evidence of recurrence  . Diabetes mellitus    Type 2  . Ear infection   . Essential hypertension   . Hemorrhoids    Internal-external small hemorrhoids  . Hypercholesterolemia   . Portal vein thrombosis 01/29/2020    Patient Active Problem List   Diagnosis Date Noted  . Portal vein thrombosis 01/29/2020  . Portal hypertension with esophageal varices (HCC) 04/20/2014  . Cold intolerance 08/27/2013  . Unspecified deficiency anemia 03/28/2012  . Colon cancer ([place]) 03/28/2012  . Type II or unspecified type diabetes mellitus without mention of complication, uncontrolled 07/30/2011  . Benign hypertensive heart disease without heart failure 07/30/2011  . Pure hypercholesterolemia 07/30/2011    Past Surgical History:  Procedure Laterality Date  .  ESOPHAGOGASTRODUODENOSCOPY   08/04/2010  . VAGINAL HYSTERECTOMY  1979     OB History   No obstetric history on file.     Family History  Problem Relation Age of Onset  . Hypertension Mother   . Cancer Mother   . Diabetes Mother     Social History   Tobacco Use  . Smoking status: Never Smoker  . Smokeless tobacco: Never Used  Substance Use Topics  . Alcohol use: No  . Drug use: No    Home Medications Prior to Admission medications   Medication Sig Start Date End Date Taking? Authorizing Provider  cephALEXin (KEFLEX) 500 MG capsule Take 1 capsule (500 mg total) by mouth 4 (four) times daily. 01/03/21  Yes Fredia Sorrow, MD  loperamide (IMODIUM A-D) 2 MG tablet Take 1 tablet (2 mg total) by mouth 4 (four) times daily as needed for diarrhea or loose stools. 01/03/21  Yes Fredia Sorrow, MD  ALPRAZolam Duanne Moron) 0.25 MG tablet Take 1 tablet (0.25 mg total) by mouth at bedtime as needed for anxiety. 10/23/15   Darlin Coco, MD  amLODipine (NORVASC) 5 MG tablet TAKE 1 TABLET BY MOUTH EVERY DAY 12/26/20   Skeet Latch, MD  apixaban (ELIQUIS) 5 MG TABS tablet Take 5 mg by mouth 2 (two) times daily.    [provider]  Cholecalciferol (VITAMIN D PO) Take 1 tablet by mouth daily.     [provider]  losartan-hydrochlorothiazide (HYZAAR) 100-12.5  MG tablet TAKE 1 TABLET BY MOUTH EVERY DAY 03/14/20   Skeet Latch, MD  metFORMIN (GLUCOPHAGE) 500 MG tablet Take 1,000 mg by mouth daily with breakfast.     [provider]  nadolol (CORGARD) 40 MG tablet Take 120 mg by mouth daily.     [provider]  ondansetron (ZOFRAN ODT) 4 MG disintegrating tablet Take 1 tablet (4 mg total) by mouth every 8 (eight) hours as needed for nausea or vomiting. 01/03/21   Fredia Sorrow, MD  ondansetron (ZOFRAN) 8 MG tablet Take 8 mg by mouth every 8 (eight) hours as needed for nausea or vomiting.    [provider]  rosuvastatin (CRESTOR) 5 MG tablet  TAKE 1 TABLET BY MOUTH EVERY DAY 12/26/20   Skeet Latch, MD    Allergies    Meperidine and related, Other, Contrast media [iodinated diagnostic agents], Iohexol, and Morphine and related  Review of Systems   Review of Systems  Constitutional: Negative for chills and fever.  HENT: Negative for rhinorrhea and sore throat.   Eyes: Negative for visual disturbance.  Respiratory: Negative for cough and shortness of breath.   Cardiovascular: Negative for chest pain and leg swelling.  Gastrointestinal: Positive for diarrhea, nausea and vomiting. Negative for abdominal pain.  Genitourinary: Negative for dysuria.  Musculoskeletal: Negative for back pain and neck pain.  Skin: Negative for rash.  Neurological: Negative for dizziness, light-headedness and headaches.  Hematological: Does not bruise/bleed easily.  Psychiatric/Behavioral: Negative for confusion.    Physical Exam Updated Vital Signs BP (!) 138/55 (BP Location: Right Arm)   Pulse 71   Temp 98.5 F (36.9 C) (Oral)   Resp 16   Ht 1.575 m (_0 )   Wt 68 kg   SpO2 100%   BMI 27.44 kg/m   Physical Exam Vitals and nursing note reviewed.  Constitutional:      General: She is not in acute distress.    Appearance: Normal appearance. She is well-developed. She is not ill-appearing.  HENT:     Head: Normocephalic and atraumatic.  Eyes:     Extraocular Movements: Extraocular movements intact.     Conjunctiva/sclera: Conjunctivae normal.     Pupils: Pupils are equal, round, and reactive to light.  Cardiovascular:     Rate and Rhythm: Normal rate and regular rhythm.     Heart sounds: No murmur heard.   Pulmonary:     Effort: Pulmonary effort is normal. No respiratory distress.     Breath sounds: Normal breath sounds.  Abdominal:     General: There is no distension.     Palpations: Abdomen is soft.     Tenderness: There is no abdominal tenderness.  Musculoskeletal:     Cervical back: Neck supple. No rigidity.  Skin:     General: Skin is warm and dry.     Capillary Refill: Capillary refill takes less than 2 seconds.  Neurological:     General: No focal deficit present.     Mental Status: She is alert and oriented to person, place, and time.     Cranial Nerves: No cranial nerve deficit.     Sensory: No sensory deficit.     Motor: No weakness.     ED Results / Procedures / Treatments   Labs (all labs ordered are listed, but only abnormal results are displayed) Labs Reviewed  COMPREHENSIVE METABOLIC PANEL - Abnormal; Notable for the following components:      Result Value   Sodium 132 (*)  Potassium 3.2 (*)    Glucose, Bld 138 (*)    Calcium 8.7 (*)    Total Protein 6.3 (*)    Total Bilirubin 3.2 (*)    All other components within normal limits  CBC - Abnormal; Notable for the following components:   Hemoglobin 11.7 (*)    HCT 33.8 (*)    Platelets 94 (*)    All other components within normal limits  URINALYSIS, ROUTINE W REFLEX MICROSCOPIC - Abnormal; Notable for the following components:   APPearance HAZY (*)    Hgb urine dipstick MODERATE (*)    Protein, ur TRACE (*)    Nitrite POSITIVE (*)    Leukocytes,Ua LARGE (*)    Bacteria, UA MANY (*)    All other components within normal limits  URINE CULTURE  LIPASE, BLOOD    EKG None  Radiology No results found.  Procedures Procedures   CRITICAL CARE Performed by: Fredia Sorrow Total critical care time: 35 minutes Critical care time was exclusive of separately billable procedures and treating other patients. Critical care was necessary to treat or prevent imminent or life-threatening deterioration. Critical care was time spent personally by me on the following activities: development of treatment plan with patient and/or surrogate as well as nursing, discussions with consultants, evaluation of patient's response to treatment, examination of patient, obtaining history from patient or surrogate, ordering and performing treatments  and interventions, ordering and review of laboratory studies, ordering and review of radiographic studies, pulse oximetry and re-evaluation of patient's condition.  Medications Ordered in ED Medications  acetaminophen (TYLENOL) tablet 650 mg (650 mg Oral Not Given 01/03/21 2113)  sodium chloride 0.9 % bolus 1,000 mL (0 mLs Intravenous Stopped 01/03/21 2352)  cefTRIAXone (ROCEPHIN) 1 g in sodium chloride 0.9 % 100 mL IVPB (0 g Intravenous Stopped 01/03/21 2352)    ED Course  I have reviewed the triage vital signs and the nursing notes.  Pertinent labs & imaging results that were available during my care of the patient were reviewed by me and considered in my medical decision making (see chart for details).    MDM Rules/Calculators/A&P                          Feel the patient's symptoms the past 24 hours have been consistent with a gastroenteritis.  Abdomen is completely soft and nontender.  No concerns about acute abdominal process.  Patient has had no vomiting for the last several hours.  And no significantly loose bowel movements.  However she does have a low-grade fever.  Patient does have an IV so we will give IV fluids and hydrate her.  Lab work-up consistent for urinary tract infection.  Urine sent for culture.  Patient given a gram of Rocephin will be continued on Keflex at home.  Will follow up with her primary care doctor to make sure that the urine is improving.  Patient had no symptoms consistent with a urinary tract infection other than the low-grade fever.  Patient's electrolytes here tonight with some mild hyponatremia 132 some mild hypokalemia potassium 3.2.  CO2 though very normal at 23.  Liver function test without significant abnormalities other than a total bili of 3.2.  Alk phos is normal.  Again no abdominal tenderness.  CBC has no leukocytosis.  Hemoglobin is 11.7  Patient feeling much better after the fluids and family feels that she is mentating better.  Patient stable for  discharge home  Final Clinical Impression(s) / ED Diagnoses Final diagnoses:  Gastroenteritis  Acute cystitis without hematuria    Rx / DC Orders ED Discharge Orders         Ordered    cephALEXin (KEFLEX) 500 MG capsule  4 times daily        01/03/21 2332    loperamide (IMODIUM A-D) 2 MG tablet  4 times daily PRN        01/03/21 2333    ondansetron (ZOFRAN ODT) 4 MG disintegrating tablet  Every 8 hours PRN,   Status:  Discontinued        01/03/21 2333    ondansetron (ZOFRAN ODT) 4 MG disintegrating tablet  Every 8 hours PRN        01/03/21 2337           Fredia Sorrow, MD 01/04/21 0104

## 2021-01-07 LAB — URINE CULTURE: Culture: >=100000 — AB

## 2021-01-08 ENCOUNTER — Telehealth: Payer: Self-pay | Admitting: *Deleted

## 2021-01-08 DIAGNOSIS — N390 Urinary tract infection, site not specified: Secondary | ICD-10-CM | POA: Diagnosis not present

## 2021-01-08 DIAGNOSIS — E1169 Type 2 diabetes mellitus with other specified complication: Secondary | ICD-10-CM | POA: Diagnosis not present

## 2021-01-08 DIAGNOSIS — E876 Hypokalemia: Secondary | ICD-10-CM | POA: Diagnosis not present

## 2021-01-08 DIAGNOSIS — R448 Other symptoms and signs involving general sensations and perceptions: Secondary | ICD-10-CM | POA: Diagnosis not present

## 2021-01-08 DIAGNOSIS — E871 Hypo-osmolality and hyponatremia: Secondary | ICD-10-CM | POA: Diagnosis not present

## 2021-01-08 DIAGNOSIS — K529 Noninfective gastroenteritis and colitis, unspecified: Secondary | ICD-10-CM | POA: Diagnosis not present

## 2021-01-08 NOTE — Telephone Encounter (Signed)
Post ED Visit - Positive Culture Follow-up  Culture report reviewed by antimicrobial stewardship pharmacist: [place] Team []  Elenor Quinones, Pharm.D. [x]  Heide Guile, Pharm.D., BCPS AQ-ID []  Parks Neptune, Pharm.D., BCPS []  Alycia Rossetti, Pharm.D., BCPS []  Dibble, Pharm.D., BCPS, AAHIVP []  Legrand Como, Pharm.D., BCPS, AAHIVP []  Salome Arnt, PharmD, BCPS []  Johnnette Gourd, PharmD, BCPS []  Hughes Better, PharmD, BCPS []  Leeroy Cha, PharmD []  Laqueta Linden, PharmD, BCPS []  Albertina Parr, PharmD  [place] Team []  Leodis Sias, PharmD []  Lindell Spar, PharmD []  Royetta Asal, PharmD []  Graylin Shiver, Rph []  Rema Fendt) Glennon Mac, PharmD []  Arlyn Dunning, PharmD []  Netta Cedars, PharmD []  Dia Sitter, PharmD []  Leone Haven, PharmD []  Gretta Arab, PharmD []  Theodis Shove, PharmD []  Peggyann Juba, PharmD []  Reuel Boom, PharmD   Positive urine culture Treated with Cephalexin, organism sensitive to the same and no further patient follow-up is required at this time.  Harlon Flor [facility] 01/08/2021, 10:28 AM

## 2021-01-23 ENCOUNTER — Other Ambulatory Visit: Payer: Self-pay | Admitting: Cardiovascular Disease

## 2021-01-28 DIAGNOSIS — K766 Portal hypertension: Secondary | ICD-10-CM | POA: Diagnosis not present

## 2021-01-28 DIAGNOSIS — R945 Abnormal results of liver function studies: Secondary | ICD-10-CM | POA: Diagnosis not present

## 2021-01-29 DIAGNOSIS — N390 Urinary tract infection, site not specified: Secondary | ICD-10-CM | POA: Diagnosis not present

## 2021-01-31 DIAGNOSIS — Z1289 Encounter for screening for malignant neoplasm of other sites: Secondary | ICD-10-CM | POA: Diagnosis not present

## 2021-01-31 DIAGNOSIS — K766 Portal hypertension: Secondary | ICD-10-CM | POA: Diagnosis not present

## 2021-01-31 DIAGNOSIS — I81 Portal vein thrombosis: Secondary | ICD-10-CM | POA: Diagnosis not present

## 2021-01-31 DIAGNOSIS — K7469 Other cirrhosis of liver: Secondary | ICD-10-CM | POA: Diagnosis not present

## 2021-02-03 DIAGNOSIS — I8500 Esophageal varices without bleeding: Secondary | ICD-10-CM | POA: Diagnosis not present

## 2021-02-04 DIAGNOSIS — M8580 Other specified disorders of bone density and structure, unspecified site: Secondary | ICD-10-CM | POA: Diagnosis not present

## 2021-02-04 DIAGNOSIS — E7800 Pure hypercholesterolemia, unspecified: Secondary | ICD-10-CM | POA: Diagnosis not present

## 2021-02-04 DIAGNOSIS — D500 Iron deficiency anemia secondary to blood loss (chronic): Secondary | ICD-10-CM | POA: Diagnosis not present

## 2021-02-04 DIAGNOSIS — E119 Type 2 diabetes mellitus without complications: Secondary | ICD-10-CM | POA: Diagnosis not present

## 2021-02-04 DIAGNOSIS — I10 Essential (primary) hypertension: Secondary | ICD-10-CM | POA: Diagnosis not present

## 2021-02-04 DIAGNOSIS — E1169 Type 2 diabetes mellitus with other specified complication: Secondary | ICD-10-CM | POA: Diagnosis not present

## 2021-02-19 ENCOUNTER — Other Ambulatory Visit: Payer: Self-pay | Admitting: Cardiovascular Disease

## 2021-02-27 DIAGNOSIS — M810 Age-related osteoporosis without current pathological fracture: Secondary | ICD-10-CM | POA: Diagnosis not present

## 2021-02-27 DIAGNOSIS — D500 Iron deficiency anemia secondary to blood loss (chronic): Secondary | ICD-10-CM | POA: Diagnosis not present

## 2021-02-27 DIAGNOSIS — E7800 Pure hypercholesterolemia, unspecified: Secondary | ICD-10-CM | POA: Diagnosis not present

## 2021-02-27 DIAGNOSIS — I10 Essential (primary) hypertension: Secondary | ICD-10-CM | POA: Diagnosis not present

## 2021-02-27 DIAGNOSIS — E119 Type 2 diabetes mellitus without complications: Secondary | ICD-10-CM | POA: Diagnosis not present

## 2021-02-27 DIAGNOSIS — E1169 Type 2 diabetes mellitus with other specified complication: Secondary | ICD-10-CM | POA: Diagnosis not present

## 2021-02-27 DIAGNOSIS — Z85038 Personal history of other malignant neoplasm of large intestine: Secondary | ICD-10-CM | POA: Diagnosis not present

## 2021-02-27 DIAGNOSIS — E785 Hyperlipidemia, unspecified: Secondary | ICD-10-CM | POA: Diagnosis not present

## 2021-03-05 DIAGNOSIS — Z0000 Encounter for general adult medical examination without abnormal findings: Secondary | ICD-10-CM | POA: Diagnosis not present

## 2021-03-19 DIAGNOSIS — Z780 Asymptomatic menopausal state: Secondary | ICD-10-CM | POA: Diagnosis not present

## 2021-03-19 DIAGNOSIS — Z1231 Encounter for screening mammogram for malignant neoplasm of breast: Secondary | ICD-10-CM | POA: Diagnosis not present

## 2021-03-19 DIAGNOSIS — M810 Age-related osteoporosis without current pathological fracture: Secondary | ICD-10-CM | POA: Diagnosis not present

## 2021-03-19 DIAGNOSIS — M8589 Other specified disorders of bone density and structure, multiple sites: Secondary | ICD-10-CM | POA: Diagnosis not present

## 2021-03-25 ENCOUNTER — Other Ambulatory Visit: Payer: Self-pay | Admitting: Cardiovascular Disease

## 2021-03-25 NOTE — Telephone Encounter (Signed)
Overdue for follow up. Note sent to scheduling team. Patient already received 30 day supply with notice to schedule office visit. 15 day supply provided. No further refills until office visit scheduled.   Loel Dubonnet, NP

## 2021-03-25 NOTE — Telephone Encounter (Signed)
Please schedule overdue follow up with Dr. Oval Linsey or APP. Thank you!

## 2021-03-28 ENCOUNTER — Other Ambulatory Visit: Payer: Self-pay | Admitting: Cardiovascular Disease

## 2021-03-31 DIAGNOSIS — M810 Age-related osteoporosis without current pathological fracture: Secondary | ICD-10-CM | POA: Diagnosis not present

## 2021-03-31 DIAGNOSIS — K7469 Other cirrhosis of liver: Secondary | ICD-10-CM | POA: Diagnosis not present

## 2021-04-02 DIAGNOSIS — R300 Dysuria: Secondary | ICD-10-CM | POA: Diagnosis not present

## 2021-04-02 DIAGNOSIS — U071 COVID-19: Secondary | ICD-10-CM | POA: Diagnosis not present

## 2021-04-04 ENCOUNTER — Other Ambulatory Visit: Payer: Self-pay | Admitting: Cardiovascular Disease

## 2021-04-15 DIAGNOSIS — E1169 Type 2 diabetes mellitus with other specified complication: Secondary | ICD-10-CM | POA: Diagnosis not present

## 2021-04-15 DIAGNOSIS — K7469 Other cirrhosis of liver: Secondary | ICD-10-CM | POA: Diagnosis not present

## 2021-04-15 DIAGNOSIS — I8500 Esophageal varices without bleeding: Secondary | ICD-10-CM | POA: Diagnosis not present

## 2021-04-15 DIAGNOSIS — E785 Hyperlipidemia, unspecified: Secondary | ICD-10-CM | POA: Diagnosis not present

## 2021-04-15 DIAGNOSIS — I10 Essential (primary) hypertension: Secondary | ICD-10-CM | POA: Diagnosis not present

## 2021-04-24 ENCOUNTER — Other Ambulatory Visit: Payer: Self-pay | Admitting: Cardiovascular Disease

## 2021-04-24 DIAGNOSIS — D500 Iron deficiency anemia secondary to blood loss (chronic): Secondary | ICD-10-CM | POA: Diagnosis not present

## 2021-04-24 DIAGNOSIS — M810 Age-related osteoporosis without current pathological fracture: Secondary | ICD-10-CM | POA: Diagnosis not present

## 2021-04-24 DIAGNOSIS — E1169 Type 2 diabetes mellitus with other specified complication: Secondary | ICD-10-CM | POA: Diagnosis not present

## 2021-04-24 DIAGNOSIS — E119 Type 2 diabetes mellitus without complications: Secondary | ICD-10-CM | POA: Diagnosis not present

## 2021-04-24 DIAGNOSIS — E785 Hyperlipidemia, unspecified: Secondary | ICD-10-CM | POA: Diagnosis not present

## 2021-04-24 DIAGNOSIS — E7800 Pure hypercholesterolemia, unspecified: Secondary | ICD-10-CM | POA: Diagnosis not present

## 2021-04-24 DIAGNOSIS — I10 Essential (primary) hypertension: Secondary | ICD-10-CM | POA: Diagnosis not present

## 2021-04-24 DIAGNOSIS — Z85038 Personal history of other malignant neoplasm of large intestine: Secondary | ICD-10-CM | POA: Diagnosis not present

## 2021-04-24 NOTE — Telephone Encounter (Signed)
Rx(s) sent to pharmacy electronically.  

## 2021-05-07 DIAGNOSIS — K3189 Other diseases of stomach and duodenum: Secondary | ICD-10-CM | POA: Diagnosis not present

## 2021-05-07 DIAGNOSIS — K7460 Unspecified cirrhosis of liver: Secondary | ICD-10-CM | POA: Diagnosis not present

## 2021-05-07 DIAGNOSIS — E785 Hyperlipidemia, unspecified: Secondary | ICD-10-CM | POA: Diagnosis not present

## 2021-05-07 DIAGNOSIS — Z7984 Long term (current) use of oral hypoglycemic drugs: Secondary | ICD-10-CM | POA: Diagnosis not present

## 2021-05-07 DIAGNOSIS — K766 Portal hypertension: Secondary | ICD-10-CM | POA: Diagnosis not present

## 2021-05-07 DIAGNOSIS — E119 Type 2 diabetes mellitus without complications: Secondary | ICD-10-CM | POA: Diagnosis not present

## 2021-05-07 DIAGNOSIS — Z79899 Other long term (current) drug therapy: Secondary | ICD-10-CM | POA: Diagnosis not present

## 2021-05-07 DIAGNOSIS — I8500 Esophageal varices without bleeding: Secondary | ICD-10-CM | POA: Diagnosis not present

## 2021-05-07 DIAGNOSIS — I10 Essential (primary) hypertension: Secondary | ICD-10-CM | POA: Diagnosis not present

## 2021-05-07 DIAGNOSIS — I8510 Secondary esophageal varices without bleeding: Secondary | ICD-10-CM | POA: Diagnosis not present

## 2021-05-18 DIAGNOSIS — Z85828 Personal history of other malignant neoplasm of skin: Secondary | ICD-10-CM | POA: Diagnosis not present

## 2021-05-18 DIAGNOSIS — L821 Other seborrheic keratosis: Secondary | ICD-10-CM | POA: Diagnosis not present

## 2021-05-18 DIAGNOSIS — D1801 Hemangioma of skin and subcutaneous tissue: Secondary | ICD-10-CM | POA: Diagnosis not present

## 2021-05-18 DIAGNOSIS — L218 Other seborrheic dermatitis: Secondary | ICD-10-CM | POA: Diagnosis not present

## 2021-05-18 DIAGNOSIS — L603 Nail dystrophy: Secondary | ICD-10-CM | POA: Diagnosis not present

## 2021-05-20 DIAGNOSIS — I8500 Esophageal varices without bleeding: Secondary | ICD-10-CM | POA: Diagnosis not present

## 2021-05-20 DIAGNOSIS — K7469 Other cirrhosis of liver: Secondary | ICD-10-CM | POA: Diagnosis not present

## 2021-05-23 ENCOUNTER — Other Ambulatory Visit: Payer: Self-pay | Admitting: Cardiovascular Disease

## 2021-05-29 ENCOUNTER — Other Ambulatory Visit: Payer: Self-pay

## 2021-05-29 ENCOUNTER — Ambulatory Visit (HOSPITAL_BASED_OUTPATIENT_CLINIC_OR_DEPARTMENT_OTHER): Payer: Medicare Other | Admitting: Cardiovascular Disease

## 2021-05-29 ENCOUNTER — Encounter (HOSPITAL_BASED_OUTPATIENT_CLINIC_OR_DEPARTMENT_OTHER): Payer: Self-pay | Admitting: Cardiovascular Disease

## 2021-05-29 DIAGNOSIS — I119 Hypertensive heart disease without heart failure: Secondary | ICD-10-CM

## 2021-05-29 DIAGNOSIS — I81 Portal vein thrombosis: Secondary | ICD-10-CM

## 2021-05-29 DIAGNOSIS — E7800 Pure hypercholesterolemia, unspecified: Secondary | ICD-10-CM | POA: Diagnosis not present

## 2021-05-29 NOTE — Assessment & Plan Note (Addendum)
BP has been well-controlled on amlodipine,losartan/HCTZ and nadolol.  She will try to work on increasing exercise.

## 2021-05-29 NOTE — Assessment & Plan Note (Signed)
Lipids are well-controlled on rosuvastatin.

## 2021-05-29 NOTE — Assessment & Plan Note (Signed)
-   Continue Eliquis 

## 2021-05-29 NOTE — Patient Instructions (Signed)
Medication Instructions:  ?Your physician recommends that you continue on your current medications as directed. Please refer to the Current Medication list given to you today.  ? ?*If you need a refill on your cardiac medications before your next appointment, please call your pharmacy* ? ?Lab Work: ?NONE ? ?Testing/Procedures: ?NONE ? ?Follow-Up: ?At [facility], you and your health needs are our priority.  As part of our continuing mission to provide you with exceptional heart care, we have created designated Provider Care Teams.  These Care Teams include your primary Cardiologist (physician) and Advanced Practice Providers (APPs -  Physician Assistants and Nurse Practitioners) who all work together to provide you with the care you need, when you need it. ? ?We recommend signing up for the patient portal called "MyChart".  Sign up information is provided on this After Visit Summary.  MyChart is used to connect with patients for Virtual Visits (Telemedicine).  Patients are able to view lab/test results, encounter notes, upcoming appointments, etc.  Non-urgent messages can be sent to your provider as well.   ?To learn more about what you can do with MyChart, go to [email or web address].   ? ?Your next appointment:   ?12 month(s) ? ?The format for your next appointment:   ?In Person ? ?Provider:   ?Tiffany Marianna, MD  ? ? ? ? ? ? ?

## 2021-05-29 NOTE — Progress Notes (Signed)
Cardiology Office Note    Evaluation Performed:  Follow-up visit  Date:  05/29/2021   ID:  Amber Crosby, DOB [birth year], MRN [id number]  PCP:  Vernie Shanks, MD  Cardiologist:  Skeet Latch, MD  Electrophysiologist:  None   Chief Complaint:  Follow up  History of Present Illness:    Amber Crosby is a 75 y.o. female with diabetes mellitus type 2, hypertension, hyperlipidemia, esophageal varices, portal vein thrombosis, and portal hypertension 2/2 chemotherapy who presents for follow up.  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.  She has some weakness in her left leg since taking chemotherapy in 2006.  She developed melena and was found to have esophageal varices in 2011.  This was also thought to be a result of the chemotherapy.  Ms. Amber Crosby was previously taking rosuvastatin daily but reduced it to every other day because of concern for potential memory deficits.  She was not experiencing any problems at the time.     Ms. Amber Crosby saw [name] who referred her for an abdominal ultrasound 08/2017 that revealed a portal vein thrombus.  She was also noted to have an area of nodular echogenicity and MRI was ordered.  No mass was noted on her MRI 09/2017.  She followed up with [name] (liver transplant at [facility]) and was started on Eliquis 03/2018.  Follow up imaging showed that the clot was stable but there was no regression.  Her doctor has since left the clinic and she will now see [name].  She is unsure whether she will remain on Eliquis.  She has surveillance MRIs every 4 months and her liver has been stable.  She was treated for a severe UTI 08/2018 and had hyponatremia.  She received IV fluids and had repeat labs with her PCP normalized.  At her last appointment, her blood pressure was not well-controlled. She wanted to work on diet and exercise before adding any medications.  Today, she is doing okay however has been stressed since husband's  heart surgery. Her husband has not been doing well. He fell 4 times since June and does not want to use a walker.  She can't get much exercise because she is caring for him and he cannot walk far.  She denies any chest pain, chest pressure or shortness of breath. She also reported LE edema when she is riding in the car or intake more salt than usual. She rates the swelling 3/10.    Past Medical History:  Diagnosis Date   Anxiety    Colon cancer ([place])    Without evidence of recurrence   Diabetes mellitus    Type 2   Ear infection    Essential hypertension    Hemorrhoids    Internal-external small hemorrhoids   Hypercholesterolemia    Portal vein thrombosis 01/29/2020   Past Surgical History:  Procedure Laterality Date   ESOPHAGOGASTRODUODENOSCOPY   08/04/2010   VAGINAL HYSTERECTOMY  1979     Current Meds  Medication Sig   ALPRAZolam (XANAX) 0.25 MG tablet Take 1 tablet (0.25 mg total) by mouth at bedtime as needed for anxiety.   amLODipine (NORVASC) 5 MG tablet TAKE 1 TABLET BY MOUTH EVERY DAY   apixaban (ELIQUIS) 5 MG TABS tablet Take 5 mg by mouth 2 (two) times daily.   Cholecalciferol (VITAMIN D PO) Take 1 tablet by mouth daily.    loperamide (IMODIUM A-D) 2 MG tablet Take 1  tablet (2 mg total) by mouth 4 (four) times daily as needed for diarrhea or loose stools.   losartan-hydrochlorothiazide (HYZAAR) 100-12.5 MG tablet Take 1 tablet by mouth daily. Must keep appointment for further refills.   metFORMIN (GLUCOPHAGE) 500 MG tablet Take 1,000 mg by mouth daily with breakfast.    nadolol (CORGARD) 40 MG tablet Take 120 mg by mouth daily.    ondansetron (ZOFRAN ODT) 4 MG disintegrating tablet Take 1 tablet (4 mg total) by mouth every 8 (eight) hours as needed for nausea or vomiting.   ondansetron (ZOFRAN) 8 MG tablet Take 8 mg by mouth every 8 (eight) hours as needed for nausea or vomiting.   rosuvastatin (CRESTOR) 5 MG tablet TAKE 1 TABLET (5 MG TOTAL) BY MOUTH DAILY.      Allergies:   Meperidine and related, Other, Contrast media [iodinated diagnostic agents], Iohexol, and Morphine and related   Social History   Tobacco Use   Smoking status: Never   Smokeless tobacco: Never  Substance Use Topics   Alcohol use: No   Drug use: No     Family Hx: The patient's family history includes Cancer in her mother; Diabetes in her mother; Hypertension in her mother.  ROS:   Please see the history of present illness.    (+) LE edema (+) stress All other systems reviewed and are negative.  Prior CV studies:   The following studies were reviewed today: No CV studies available.  EKG  01/29/20: Sinus rhythm.  Rate 65 bpm. 9/22: Sinus arrhythmia. Rate 73 bpm.   Labs/Other Tests and Data Reviewed:    Recent Labs: 01/03/2021: ALT 30; BUN 16; Creatinine, Ser 0.51; Hemoglobin 11.7; Platelets 94; Potassium 3.2; Sodium 132   Recent Lipid Panel Lab Results  Component Value Date/Time   CHOL 129 03/03/2018 09:26 AM   TRIG 47 03/03/2018 09:26 AM   HDL 60 03/03/2018 09:26 AM   CHOLHDL 2.2 03/03/2018 09:26 AM   CHOLHDL 2.8 05/18/2016 10:29 AM   LDLCALC 60 03/03/2018 09:26 AM    Wt Readings from Last 3 Encounters:  05/29/21 152 lb 14.4 oz (69.4 kg)  01/03/21 150 lb (68 kg)  01/29/20 158 lb 6.4 oz (71.8 kg)     Objective:    VS:  BP 130/72   Pulse 73   Ht '5\' 2"'$  (1.575 m)   Wt 152 lb 14.4 oz (69.4 kg)   BMI 27.97 kg/m  , BMI Body mass index is 27.97 kg/m. GENERAL:  Well appearing HEENT: Pupils equal round and reactive, fundi not visualized, oral mucosa unremarkable NECK:  No jugular venous distention, waveform within normal limits, carotid upstroke brisk and symmetric, no bruits LUNGS:  Clear to auscultation bilaterally HEART:  RRR.  PMI not displaced or sustained,S1 and S2 within normal limits, no S3, no S4, no clicks, no rubs, no murmurs ABD:  Flat, positive bowel sounds normal in frequency in pitch, no bruits, no rebound, no guarding, no midline  pulsatile mass, no hepatomegaly, no splenomegaly EXT:  2 plus pulses throughout, no edema, no cyanosis no clubbing SKIN:  No rashes no nodules NEURO:  Cranial nerves II through XII grossly intact, motor grossly intact throughout PSYCH:  Cognitively intact, oriented to person place and time   ASSESSMENT & PLAN:    Benign hypertensive heart disease without heart failure BP has been well-controlled on amlodipine,losartan/HCTZ and nadolol.  She will try to work on increasing exercise.  Pure hypercholesterolemia Lipids are well-controlled on rosuvastatin.  Portal vein thrombosis Continue Eliquis.  Medication Adjustments/Labs and Tests Ordered: Current medicines are reviewed at length with the patient today.  Concerns regarding medicines are outlined above.   Tests Ordered: Orders Placed This Encounter  Procedures   EKG 12-Lead    Medication Changes: No orders of the defined types were placed in this encounter.   Disposition:  Follow up with Espen Bethel C. Oval Linsey, MD, [facility] in 1 year  I,Jada Bradford,acting as a scribe for Skeet Latch, MD.,have documented all relevant documentation on the behalf of Skeet Latch, MD,as directed by  Skeet Latch, MD while in the presence of Skeet Latch, MD.  I, Lyford Oval Linsey, MD have reviewed all documentation for this visit.  The documentation of the exam, diagnosis, procedures, and orders on 05/29/2021 are all accurate and complete.   Signed, Skeet Latch, MD  05/29/2021 5:07 PM    [place]

## 2021-06-02 DIAGNOSIS — D500 Iron deficiency anemia secondary to blood loss (chronic): Secondary | ICD-10-CM | POA: Diagnosis not present

## 2021-06-02 DIAGNOSIS — K7469 Other cirrhosis of liver: Secondary | ICD-10-CM | POA: Diagnosis not present

## 2021-06-02 DIAGNOSIS — I8500 Esophageal varices without bleeding: Secondary | ICD-10-CM | POA: Diagnosis not present

## 2021-06-02 DIAGNOSIS — M810 Age-related osteoporosis without current pathological fracture: Secondary | ICD-10-CM | POA: Diagnosis not present

## 2021-06-02 DIAGNOSIS — Z85038 Personal history of other malignant neoplasm of large intestine: Secondary | ICD-10-CM | POA: Diagnosis not present

## 2021-06-02 DIAGNOSIS — I10 Essential (primary) hypertension: Secondary | ICD-10-CM | POA: Diagnosis not present

## 2021-06-02 DIAGNOSIS — E1169 Type 2 diabetes mellitus with other specified complication: Secondary | ICD-10-CM | POA: Diagnosis not present

## 2021-06-02 DIAGNOSIS — E785 Hyperlipidemia, unspecified: Secondary | ICD-10-CM | POA: Diagnosis not present

## 2021-06-20 ENCOUNTER — Other Ambulatory Visit: Payer: Self-pay | Admitting: Cardiovascular Disease

## 2021-06-24 ENCOUNTER — Other Ambulatory Visit: Payer: Self-pay | Admitting: Cardiovascular Disease

## 2021-07-08 DIAGNOSIS — M810 Age-related osteoporosis without current pathological fracture: Secondary | ICD-10-CM | POA: Diagnosis not present

## 2021-07-08 DIAGNOSIS — I10 Essential (primary) hypertension: Secondary | ICD-10-CM | POA: Diagnosis not present

## 2021-07-08 DIAGNOSIS — E7800 Pure hypercholesterolemia, unspecified: Secondary | ICD-10-CM | POA: Diagnosis not present

## 2021-07-08 DIAGNOSIS — D500 Iron deficiency anemia secondary to blood loss (chronic): Secondary | ICD-10-CM | POA: Diagnosis not present

## 2021-07-08 DIAGNOSIS — E1169 Type 2 diabetes mellitus with other specified complication: Secondary | ICD-10-CM | POA: Diagnosis not present

## 2021-07-08 DIAGNOSIS — E785 Hyperlipidemia, unspecified: Secondary | ICD-10-CM | POA: Diagnosis not present

## 2021-10-16 DIAGNOSIS — Z85038 Personal history of other malignant neoplasm of large intestine: Secondary | ICD-10-CM | POA: Diagnosis not present

## 2021-10-16 DIAGNOSIS — E1169 Type 2 diabetes mellitus with other specified complication: Secondary | ICD-10-CM | POA: Diagnosis not present

## 2021-10-16 DIAGNOSIS — D500 Iron deficiency anemia secondary to blood loss (chronic): Secondary | ICD-10-CM | POA: Diagnosis not present

## 2021-10-16 DIAGNOSIS — E7800 Pure hypercholesterolemia, unspecified: Secondary | ICD-10-CM | POA: Diagnosis not present

## 2021-10-16 DIAGNOSIS — M810 Age-related osteoporosis without current pathological fracture: Secondary | ICD-10-CM | POA: Diagnosis not present

## 2021-10-16 DIAGNOSIS — I10 Essential (primary) hypertension: Secondary | ICD-10-CM | POA: Diagnosis not present

## 2021-10-20 DIAGNOSIS — Z20822 Contact with and (suspected) exposure to covid-19: Secondary | ICD-10-CM | POA: Diagnosis not present

## 2021-10-20 DIAGNOSIS — J3489 Other specified disorders of nose and nasal sinuses: Secondary | ICD-10-CM | POA: Diagnosis not present

## 2021-10-20 DIAGNOSIS — R5383 Other fatigue: Secondary | ICD-10-CM | POA: Diagnosis not present

## 2021-11-02 DIAGNOSIS — E1169 Type 2 diabetes mellitus with other specified complication: Secondary | ICD-10-CM | POA: Diagnosis not present

## 2021-11-02 DIAGNOSIS — B372 Candidiasis of skin and nail: Secondary | ICD-10-CM | POA: Diagnosis not present

## 2021-11-02 DIAGNOSIS — D696 Thrombocytopenia, unspecified: Secondary | ICD-10-CM | POA: Diagnosis not present

## 2021-11-02 DIAGNOSIS — D1779 Benign lipomatous neoplasm of other sites: Secondary | ICD-10-CM | POA: Diagnosis not present

## 2021-11-02 DIAGNOSIS — E785 Hyperlipidemia, unspecified: Secondary | ICD-10-CM | POA: Diagnosis not present

## 2021-11-18 DIAGNOSIS — E119 Type 2 diabetes mellitus without complications: Secondary | ICD-10-CM | POA: Diagnosis not present

## 2021-11-18 DIAGNOSIS — H52203 Unspecified astigmatism, bilateral: Secondary | ICD-10-CM | POA: Diagnosis not present

## 2021-11-18 DIAGNOSIS — H2513 Age-related nuclear cataract, bilateral: Secondary | ICD-10-CM | POA: Diagnosis not present

## 2021-12-28 DIAGNOSIS — D696 Thrombocytopenia, unspecified: Secondary | ICD-10-CM | POA: Diagnosis not present

## 2021-12-28 DIAGNOSIS — E1169 Type 2 diabetes mellitus with other specified complication: Secondary | ICD-10-CM | POA: Diagnosis not present

## 2021-12-28 DIAGNOSIS — E785 Hyperlipidemia, unspecified: Secondary | ICD-10-CM | POA: Diagnosis not present

## 2021-12-31 DIAGNOSIS — D696 Thrombocytopenia, unspecified: Secondary | ICD-10-CM | POA: Diagnosis not present

## 2021-12-31 DIAGNOSIS — E1169 Type 2 diabetes mellitus with other specified complication: Secondary | ICD-10-CM | POA: Diagnosis not present

## 2021-12-31 DIAGNOSIS — M810 Age-related osteoporosis without current pathological fracture: Secondary | ICD-10-CM | POA: Diagnosis not present

## 2021-12-31 DIAGNOSIS — E785 Hyperlipidemia, unspecified: Secondary | ICD-10-CM | POA: Diagnosis not present

## 2021-12-31 DIAGNOSIS — I10 Essential (primary) hypertension: Secondary | ICD-10-CM | POA: Diagnosis not present

## 2022-04-15 DIAGNOSIS — I10 Essential (primary) hypertension: Secondary | ICD-10-CM | POA: Diagnosis not present

## 2022-04-15 DIAGNOSIS — M810 Age-related osteoporosis without current pathological fracture: Secondary | ICD-10-CM | POA: Diagnosis not present

## 2022-04-15 DIAGNOSIS — E1169 Type 2 diabetes mellitus with other specified complication: Secondary | ICD-10-CM | POA: Diagnosis not present

## 2022-04-15 DIAGNOSIS — E785 Hyperlipidemia, unspecified: Secondary | ICD-10-CM | POA: Diagnosis not present

## 2022-05-19 DIAGNOSIS — E7800 Pure hypercholesterolemia, unspecified: Secondary | ICD-10-CM | POA: Diagnosis not present

## 2022-05-19 DIAGNOSIS — M810 Age-related osteoporosis without current pathological fracture: Secondary | ICD-10-CM | POA: Diagnosis not present

## 2022-05-19 DIAGNOSIS — I10 Essential (primary) hypertension: Secondary | ICD-10-CM | POA: Diagnosis not present

## 2022-05-19 DIAGNOSIS — E1169 Type 2 diabetes mellitus with other specified complication: Secondary | ICD-10-CM | POA: Diagnosis not present

## 2022-06-08 DIAGNOSIS — L570 Actinic keratosis: Secondary | ICD-10-CM | POA: Diagnosis not present

## 2022-06-08 DIAGNOSIS — D044 Carcinoma in situ of skin of scalp and neck: Secondary | ICD-10-CM | POA: Diagnosis not present

## 2022-06-08 DIAGNOSIS — L578 Other skin changes due to chronic exposure to nonionizing radiation: Secondary | ICD-10-CM | POA: Diagnosis not present

## 2022-06-08 DIAGNOSIS — D485 Neoplasm of uncertain behavior of skin: Secondary | ICD-10-CM | POA: Diagnosis not present

## 2022-06-08 DIAGNOSIS — L821 Other seborrheic keratosis: Secondary | ICD-10-CM | POA: Diagnosis not present

## 2022-06-10 DIAGNOSIS — K766 Portal hypertension: Secondary | ICD-10-CM | POA: Diagnosis not present

## 2022-06-10 DIAGNOSIS — K7689 Other specified diseases of liver: Secondary | ICD-10-CM | POA: Diagnosis not present

## 2022-06-10 DIAGNOSIS — Z79899 Other long term (current) drug therapy: Secondary | ICD-10-CM | POA: Diagnosis not present

## 2022-06-10 DIAGNOSIS — Z7901 Long term (current) use of anticoagulants: Secondary | ICD-10-CM | POA: Diagnosis not present

## 2022-06-10 DIAGNOSIS — E119 Type 2 diabetes mellitus without complications: Secondary | ICD-10-CM | POA: Diagnosis not present

## 2022-06-10 DIAGNOSIS — I472 Ventricular tachycardia, unspecified: Secondary | ICD-10-CM | POA: Diagnosis not present

## 2022-06-10 DIAGNOSIS — Z23 Encounter for immunization: Secondary | ICD-10-CM | POA: Diagnosis not present

## 2022-06-10 DIAGNOSIS — Z7984 Long term (current) use of oral hypoglycemic drugs: Secondary | ICD-10-CM | POA: Diagnosis not present

## 2022-06-10 DIAGNOSIS — E785 Hyperlipidemia, unspecified: Secondary | ICD-10-CM | POA: Diagnosis not present

## 2022-06-26 DIAGNOSIS — K7460 Unspecified cirrhosis of liver: Secondary | ICD-10-CM | POA: Diagnosis not present

## 2022-06-26 DIAGNOSIS — K862 Cyst of pancreas: Secondary | ICD-10-CM | POA: Diagnosis not present

## 2022-06-26 DIAGNOSIS — K766 Portal hypertension: Secondary | ICD-10-CM | POA: Diagnosis not present

## 2022-06-26 DIAGNOSIS — Z8505 Personal history of malignant neoplasm of liver: Secondary | ICD-10-CM | POA: Diagnosis not present

## 2022-06-26 DIAGNOSIS — K7469 Other cirrhosis of liver: Secondary | ICD-10-CM | POA: Diagnosis not present

## 2022-07-02 ENCOUNTER — Emergency Department (HOSPITAL_BASED_OUTPATIENT_CLINIC_OR_DEPARTMENT_OTHER)
Admission: EM | Admit: 2022-07-02 | Discharge: 2022-07-02 | Disposition: A | Discharge status: Home / Self Care | Payer: Medicare Other | Attending: Emergency Medicine | Admitting: Emergency Medicine

## 2022-07-02 ENCOUNTER — Other Ambulatory Visit (HOSPITAL_BASED_OUTPATIENT_CLINIC_OR_DEPARTMENT_OTHER): Payer: Self-pay

## 2022-07-02 ENCOUNTER — Emergency Department (HOSPITAL_BASED_OUTPATIENT_CLINIC_OR_DEPARTMENT_OTHER): Payer: Medicare Other | Admitting: Radiology

## 2022-07-02 ENCOUNTER — Other Ambulatory Visit: Payer: Self-pay

## 2022-07-02 ENCOUNTER — Encounter (HOSPITAL_BASED_OUTPATIENT_CLINIC_OR_DEPARTMENT_OTHER): Payer: Self-pay

## 2022-07-02 ENCOUNTER — Encounter: Payer: Self-pay | Admitting: Hematology and Oncology

## 2022-07-02 DIAGNOSIS — Z20822 Contact with and (suspected) exposure to covid-19: Secondary | ICD-10-CM | POA: Insufficient documentation

## 2022-07-02 DIAGNOSIS — Z79899 Other long term (current) drug therapy: Secondary | ICD-10-CM | POA: Insufficient documentation

## 2022-07-02 DIAGNOSIS — J069 Acute upper respiratory infection, unspecified: Secondary | ICD-10-CM | POA: Diagnosis not present

## 2022-07-02 DIAGNOSIS — Z85038 Personal history of other malignant neoplasm of large intestine: Secondary | ICD-10-CM | POA: Insufficient documentation

## 2022-07-02 DIAGNOSIS — Z7984 Long term (current) use of oral hypoglycemic drugs: Secondary | ICD-10-CM | POA: Insufficient documentation

## 2022-07-02 DIAGNOSIS — E119 Type 2 diabetes mellitus without complications: Secondary | ICD-10-CM | POA: Diagnosis not present

## 2022-07-02 DIAGNOSIS — R059 Cough, unspecified: Secondary | ICD-10-CM | POA: Diagnosis not present

## 2022-07-02 DIAGNOSIS — B9789 Other viral agents as the cause of diseases classified elsewhere: Secondary | ICD-10-CM | POA: Diagnosis not present

## 2022-07-02 LAB — RESP PANEL BY RT-PCR (FLU A&B, COVID) ARPGX2
Influenza A by PCR: NEGATIVE
Influenza B by PCR: NEGATIVE
SARS Coronavirus 2 by RT PCR: NEGATIVE

## 2022-07-02 MED ORDER — BENZONATATE 100 MG PO CAPS: 100.0000 mg | ORAL_CAPSULE | Freq: Three times a day (TID) | ORAL | 0 refills | Status: DC

## 2022-07-02 MED ORDER — ALBUTEROL SULFATE HFA 108 (90 BASE) MCG/ACT IN AERS
1.0000 | INHALATION_SPRAY | Freq: Four times a day (QID) | RESPIRATORY_TRACT | Status: DC
  Administered 2022-07-02: 2 via RESPIRATORY_TRACT
  Filled 2022-07-02: qty 6.7

## 2022-07-02 MED ORDER — AEROCHAMBER PLUS FLO-VU MEDIUM MISC
1.0000 | Freq: Once | Status: AC
  Administered 2022-07-02: 1
  Filled 2022-07-02: qty 1

## 2022-07-02 NOTE — ED Provider Notes (Signed)
[facility] EMERGENCY DEPT Provider Note   CSN: [id number] Arrival date & time: 07/02/22  1121     History  Chief Complaint  Patient presents with   Cough    Amber Crosby is a 76 y.o. female with a past medical history of colorectal cancer, osteoporosis and type 2 diabetes presenting today due to the complaint of cough.  She reports has been going on for 5 days.  Is only occasionally productive of clear sputum.  No fevers or chills.  No shortness of breath, history of DVT, recent travel or surgery.  Says that she does have a history of hepatic thrombus for which she follows with [facility].  Had a normal MRI a week ago.  Says that she does not find the cough bothersome but her family wanted her to come.  Says that she occasionally uses cough drops but no other medications.   Cough Associated symptoms: no chest pain, no chills, no fever, no myalgias and no shortness of breath        Home Medications Prior to Admission medications   Medication Sig Start Date End Date Taking? Authorizing Provider  alendronate (FOSAMAX) 70 MG tablet  03/11/22   [provider]  ALPRAZolam (XANAX) 0.25 MG tablet Take 1 tablet (0.25 mg total) by mouth at bedtime as needed for anxiety. 10/23/15   Darlin Coco, MD  amLODipine (NORVASC) 5 MG tablet TAKE 1 TABLET BY MOUTH EVERY DAY 06/24/21   Skeet Latch, MD  apixaban (ELIQUIS) 5 MG TABS tablet Take 5 mg by mouth 2 (two) times daily.    [provider]  Cholecalciferol (VITAMIN D PO) Take 1 tablet by mouth daily.     [provider]  loperamide (IMODIUM A-D) 2 MG tablet Take 1 tablet (2 mg total) by mouth 4 (four) times daily as needed for diarrhea or loose stools. 01/03/21   Fredia Sorrow, MD  losartan-hydrochlorothiazide (HYZAAR) 100-12.5 MG tablet Take 1 tablet by mouth daily. Must keep appointment for further refills. 03/29/21   Loel Dubonnet, NP  metFORMIN (GLUCOPHAGE) 500 MG tablet Take 1,000 mg by  mouth daily with breakfast.     [provider]  nadolol (CORGARD) 40 MG tablet Take 120 mg by mouth daily.     [provider]  ondansetron (ZOFRAN ODT) 4 MG disintegrating tablet Take 1 tablet (4 mg total) by mouth every 8 (eight) hours as needed for nausea or vomiting. 01/03/21   Fredia Sorrow, MD  ondansetron (ZOFRAN) 8 MG tablet Take 8 mg by mouth every 8 (eight) hours as needed for nausea or vomiting.    [provider]  rosuvastatin (CRESTOR) 5 MG tablet TAKE 1 TABLET (5 MG TOTAL) BY MOUTH DAILY. 06/22/21   Skeet Latch, MD      Allergies    Meperidine and related, Other, Contrast media [iodinated contrast media], Iohexol, and Morphine and related    Review of Systems   Review of Systems  Constitutional:  Negative for chills and fever.  HENT:  Negative for congestion.   Respiratory:  Positive for cough. Negative for chest tightness and shortness of breath.   Cardiovascular:  Negative for chest pain and palpitations.  Musculoskeletal:  Negative for myalgias.    Physical Exam Updated Vital Signs BP (!) 152/66   Pulse 71   Temp 98.3 F (36.8 C) (Oral)   Resp 16   Ht '5\' 2"'$  (1.575 m)   Wt 69.4 kg   SpO2 98%   BMI 27.98 kg/m  Physical Exam Vitals and nursing note reviewed.  Constitutional:      General: She is not in acute distress.    Appearance: Normal appearance. She is not ill-appearing.  HENT:     Head: Normocephalic and atraumatic.     Nose: Nose normal.     Mouth/Throat:     Mouth: Mucous membranes are moist.     Pharynx: Oropharynx is clear. No oropharyngeal exudate or posterior oropharyngeal erythema.  Eyes:     General: No scleral icterus.    Conjunctiva/sclera: Conjunctivae normal.  Cardiovascular:     Rate and Rhythm: Normal rate and regular rhythm.  Pulmonary:     Effort: Pulmonary effort is normal. No respiratory distress.     Comments: Mild wheezing and L UL Skin:    Findings: No rash.  Neurological:     Mental  Status: She is alert.  Psychiatric:        Mood and Affect: Mood normal.     ED Results / Procedures / Treatments   Labs (all labs ordered are listed, but only abnormal results are displayed) Labs Reviewed  RESP PANEL BY RT-PCR (FLU A&B, COVID) [id number]    EKG None  Radiology DG Chest 2 View  Result Date: 07/02/2022 CLINICAL DATA:  Cough.  Symptoms for 1 week. EXAM: CHEST - 2 VIEW COMPARISON:  06/22/2005 FINDINGS: LEFT-sided central line has been removed. Heart is mildly enlarged. There is perihilar peribronchial thickening. Streaky perihilar opacities are also present. No discrete consolidations. No pleural effusions. No evidence for pulmonary edema. IMPRESSION: 1. Cardiomegaly. 2. Findings consistent with viral or reactive airways disease. Electronically Signed   By: Nolon Nations M.D.   On: 07/02/2022 12:07    Procedures Procedures   Medications Ordered in ED Medications  albuterol (VENTOLIN HFA) 108 (90 Base) MCG/ACT inhaler 1-2 puff (2 puffs Inhalation Given 07/02/22 1316)  AeroChamber Plus Flo-Vu Medium MISC 1 each (1 each Other Given 07/02/22 1316)    ED Course/ Medical Decision Making/ A&P                           Medical Decision Making Amount and/or Complexity of Data Reviewed Radiology: ordered.   This is a 76 year old female presenting today with 5 days worth of cough.  Differential includes but is not limited to viral illness, bronchitis, pneumonia, pneumothorax, PE.  This is not an exhaustive differential.    Past Medical History / Co-morbidities / Social History: Hepatic thrombus on Eliquis   Physical Exam: Pertinent physical exam findings include Very mild wheezing in LUL  Lab Tests: I ordered, and personally interpreted labs.  The pertinent results include: COVID and flu negative   Imaging Studies: I ordered and independently visualized and interpreted chest x-ray and I agree with the radiologist that there is some degree of reactive air disease  but no signs of pneumonia      Medications: Given albuterol inhaler    MDM/Disposition: This is a 76 year old female who is presenting with 5 days worth of a cough that is occasionally productive of clear sputum.  She says that it is not bothersome but her family said she might have COVID, the flu or some pneumonia.  She has not been having any fevers or chills.  Sputum descriptions are not concerning for pneumonia.  Very mild wheezing/rhonchi left upper lobe however this is very mild.  Unable to apply PERC criteria due to patient's age however she is low likelihood Wells PE score.  She is also already on Eliquis for a portal vein thrombus that she follows with GI with [facility].  She reports she has an MRI regularly and she had this done last week and it was unremarkable.  No change in her thrombus.  After patient's work-up today, I feel that she is stable for discharge home.  Likely has a viral cough/may be developing a viral bronchitis.  I do not believe there is an indication for antibiotics at this time as they are likely not necessary and will cause multiple side effects.  Additionally, steroids were considered however patient has osteoporosis and type 2 diabetes and I do not believe these are indicated either.  She is agreeable to the plan of albuterol inhaler and the event that she feels as though she is wheezing and returning with worsening symptoms, especially fevers, night sweats, chest pain, difficulty breathing or abnormal colored sputum.  Best friend at the bedside also voices understanding of this plan.    I discussed this case with my attending physician Dr. Oswald Hillock who cosigned this note including patient's presenting symptoms, physical exam, and planned diagnostics and interventions. Attending physician stated agreement with plan or made changes to plan which were implemented.    Final Clinical Impression(s) / ED Diagnoses Final diagnoses:  Viral URI with cough    Rx / DC  Orders ED Discharge Orders          Ordered    benzonatate (TESSALON) 100 MG capsule  Every 8 hours        07/02/22 1308           Results and diagnoses were explained to the patient. Return precautions discussed in full. Patient had no additional questions and expressed complete understanding.   This chart was dictated using voice recognition software.  Despite best efforts to proofread,  errors can occur which can change the documentation meaning.     Rhae Hammock, PA-C 07/02/22 1321    Tretha Sciara, MD 07/02/22 1351

## 2022-07-02 NOTE — Discharge Instructions (Signed)
As we discussed, your x-ray is not showing a pneumonia at this time.  Your vital signs look good outside of your blood pressure that is only slightly elevated and likely because you did not take your medications this morning.  If your symptoms go on for 10 to 14 days I encourage you to follow-up with your PCP to explore further treatment.  At this time it is likely a viral cough that does not need any medications outside of symptomatic treatment.  I have sent the Ladona Ridgel that we discussed to the pharmacy.  If you start to feel or heal some wheezing, use the inhaler.  Otherwise, please return with any fevers, chills, cough productive of thick sputum, chest pain or difficulty breathing.  It was a pleasure to meet you and I hope that you feel better!

## 2022-07-02 NOTE — ED Triage Notes (Signed)
Patient here POV from Home.  Endorses Productive Cough for approximately 5 Days. Constant and Consistent since it began.   No Confirmed Fevers. No Aches.   NAD Noted during Triage. A&Ox4. GCS 15. Ambulatory.

## 2022-07-04 ENCOUNTER — Other Ambulatory Visit: Payer: Self-pay | Admitting: Cardiovascular Disease

## 2022-07-05 NOTE — Telephone Encounter (Signed)
Rx(s) sent to pharmacy electronically.  

## 2022-07-07 DIAGNOSIS — H6123 Impacted cerumen, bilateral: Secondary | ICD-10-CM | POA: Diagnosis not present

## 2022-07-07 DIAGNOSIS — J0140 Acute pansinusitis, unspecified: Secondary | ICD-10-CM | POA: Diagnosis not present

## 2022-07-16 DIAGNOSIS — K7469 Other cirrhosis of liver: Secondary | ICD-10-CM | POA: Diagnosis not present

## 2022-07-20 DIAGNOSIS — D044 Carcinoma in situ of skin of scalp and neck: Secondary | ICD-10-CM | POA: Diagnosis not present

## 2022-07-22 DIAGNOSIS — E785 Hyperlipidemia, unspecified: Secondary | ICD-10-CM | POA: Diagnosis not present

## 2022-07-22 DIAGNOSIS — Z881 Allergy status to other antibiotic agents status: Secondary | ICD-10-CM | POA: Diagnosis not present

## 2022-07-22 DIAGNOSIS — Z888 Allergy status to other drugs, medicaments and biological substances status: Secondary | ICD-10-CM | POA: Diagnosis not present

## 2022-07-22 DIAGNOSIS — K766 Portal hypertension: Secondary | ICD-10-CM | POA: Diagnosis not present

## 2022-07-22 DIAGNOSIS — Z79899 Other long term (current) drug therapy: Secondary | ICD-10-CM | POA: Diagnosis not present

## 2022-07-22 DIAGNOSIS — K7460 Unspecified cirrhosis of liver: Secondary | ICD-10-CM | POA: Diagnosis not present

## 2022-07-22 DIAGNOSIS — K3189 Other diseases of stomach and duodenum: Secondary | ICD-10-CM | POA: Diagnosis not present

## 2022-07-22 DIAGNOSIS — E119 Type 2 diabetes mellitus without complications: Secondary | ICD-10-CM | POA: Diagnosis not present

## 2022-07-22 DIAGNOSIS — K2289 Other specified disease of esophagus: Secondary | ICD-10-CM | POA: Diagnosis not present

## 2022-07-22 DIAGNOSIS — Z91041 Radiographic dye allergy status: Secondary | ICD-10-CM | POA: Diagnosis not present

## 2022-07-22 DIAGNOSIS — Z7984 Long term (current) use of oral hypoglycemic drugs: Secondary | ICD-10-CM | POA: Diagnosis not present

## 2022-07-22 DIAGNOSIS — Z885 Allergy status to narcotic agent status: Secondary | ICD-10-CM | POA: Diagnosis not present

## 2022-07-22 DIAGNOSIS — K2980 Duodenitis without bleeding: Secondary | ICD-10-CM | POA: Diagnosis not present

## 2022-07-29 DIAGNOSIS — D044 Carcinoma in situ of skin of scalp and neck: Secondary | ICD-10-CM | POA: Diagnosis not present

## 2022-08-01 ENCOUNTER — Other Ambulatory Visit: Payer: Self-pay | Admitting: Cardiovascular Disease

## 2022-08-02 NOTE — Telephone Encounter (Signed)
Rx(s) sent to pharmacy electronically.  

## 2022-08-04 ENCOUNTER — Other Ambulatory Visit: Payer: Self-pay

## 2022-08-04 ENCOUNTER — Emergency Department (HOSPITAL_BASED_OUTPATIENT_CLINIC_OR_DEPARTMENT_OTHER)
Admission: EM | Admit: 2022-08-04 | Discharge: 2022-08-04 | Disposition: A | Discharge status: Home / Self Care | Payer: Medicare Other | Attending: Emergency Medicine | Admitting: Emergency Medicine

## 2022-08-04 ENCOUNTER — Encounter (HOSPITAL_BASED_OUTPATIENT_CLINIC_OR_DEPARTMENT_OTHER): Payer: Self-pay | Admitting: Emergency Medicine

## 2022-08-04 DIAGNOSIS — E7220 Disorder of urea cycle metabolism, unspecified: Secondary | ICD-10-CM | POA: Insufficient documentation

## 2022-08-04 DIAGNOSIS — Z7984 Long term (current) use of oral hypoglycemic drugs: Secondary | ICD-10-CM | POA: Diagnosis not present

## 2022-08-04 DIAGNOSIS — Z85038 Personal history of other malignant neoplasm of large intestine: Secondary | ICD-10-CM | POA: Insufficient documentation

## 2022-08-04 DIAGNOSIS — E119 Type 2 diabetes mellitus without complications: Secondary | ICD-10-CM | POA: Diagnosis not present

## 2022-08-04 DIAGNOSIS — R4182 Altered mental status, unspecified: Secondary | ICD-10-CM | POA: Diagnosis present

## 2022-08-04 DIAGNOSIS — R9431 Abnormal electrocardiogram [ECG] [EKG]: Secondary | ICD-10-CM | POA: Diagnosis not present

## 2022-08-04 DIAGNOSIS — I10 Essential (primary) hypertension: Secondary | ICD-10-CM | POA: Diagnosis not present

## 2022-08-04 DIAGNOSIS — Z79899 Other long term (current) drug therapy: Secondary | ICD-10-CM | POA: Insufficient documentation

## 2022-08-04 LAB — COMPREHENSIVE METABOLIC PANEL
ALT: 28 U/L (ref 0–44)
AST: 33 U/L (ref 15–41)
Albumin: 4.2 g/dL (ref 3.5–5.0)
Alkaline Phosphatase: 48 U/L (ref 38–126)
Anion gap: 12 (ref 5–15)
BUN: 16 mg/dL (ref 8–23)
CO2: 21 mmol/L — ABNORMAL LOW (ref 22–32)
Calcium: 9.8 mg/dL (ref 8.9–10.3)
Chloride: 103 mmol/L (ref 98–111)
Creatinine, Ser: 0.61 mg/dL (ref 0.44–1.00)
GFR, Estimated: >60 mL/min (ref >60)
Glucose, Bld: 225 mg/dL — ABNORMAL HIGH (ref 70–99)
Potassium: 3.8 mmol/L (ref 3.5–5.1)
Sodium: 136 mmol/L (ref 135–145)
Total Bilirubin: 2.3 mg/dL — ABNORMAL HIGH (ref 0.3–1.2)
Total Protein: 7.5 g/dL (ref 6.5–8.1)

## 2022-08-04 LAB — CBG MONITORING, ED: Glucose-Capillary: 195 mg/dL — ABNORMAL HIGH (ref 70–99)

## 2022-08-04 LAB — ACETAMINOPHEN LEVEL: Acetaminophen (Tylenol), Serum: <10 ug/mL — ABNORMAL LOW (ref 10–30)

## 2022-08-04 LAB — CBC
HCT: 38 % (ref 36.0–46.0)
Hemoglobin: 12.7 g/dL (ref 12.0–15.0)
MCH: 29.3 pg (ref 26.0–34.0)
MCHC: 33.4 g/dL (ref 30.0–36.0)
MCV: 87.6 fL (ref 80.0–100.0)
Platelets: 83 10*3/uL — ABNORMAL LOW (ref 150–400)
RBC: 4.34 MIL/uL (ref 3.87–5.11)
RDW: 15.3 % (ref 11.5–15.5)
WBC: 4.7 10*3/uL (ref 4.0–10.5)
nRBC: 0 % (ref 0.0–0.2)

## 2022-08-04 LAB — URINALYSIS, ROUTINE W REFLEX MICROSCOPIC
Bilirubin Urine: NEGATIVE
Glucose, UA: >1000 mg/dL — AB
Hgb urine dipstick: NEGATIVE
Ketones, ur: NEGATIVE mg/dL
Nitrite: NEGATIVE
Protein, ur: NEGATIVE mg/dL
Specific Gravity, Urine: 1.021 (ref 1.005–1.030)
pH: 6.5 (ref 5.0–8.0)

## 2022-08-04 LAB — SALICYLATE LEVEL: Salicylate Lvl: <7 mg/dL — ABNORMAL LOW (ref 7.0–30.0)

## 2022-08-04 LAB — ETHANOL: Alcohol, Ethyl (B): <10 mg/dL (ref <10)

## 2022-08-04 LAB — AMMONIA: Ammonia: 91 umol/L — ABNORMAL HIGH (ref 9–35)

## 2022-08-04 LAB — MAGNESIUM: Magnesium: 2 mg/dL (ref 1.7–2.4)

## 2022-08-04 MED ORDER — LACTULOSE 10 GM/15ML PO SOLN
20.0000 g | Freq: Once | ORAL | Status: AC
  Administered 2022-08-04: 20 g via ORAL
  Filled 2022-08-04: qty 30

## 2022-08-04 MED ORDER — LACTULOSE 20 GM/30ML PO SOLN: 15.0000 mL | Freq: Two times a day (BID) | ORAL | 1 refills | Status: AC

## 2022-08-04 NOTE — Discharge Instructions (Addendum)
Thank you for coming to [facility] Emergency Department. You were seen for altered mental status. We did an exam, labs, and these showed a high ammonia. We consulted to gastroenterologist who recommended lactulose for 2 weeks titrated to 3-4 bowel movements per day. You can start with 15 mL 2-3 times per day. If you don't have enough bowel movements with that dose, you can increase up to 30 mL 3 times per day.  We have also ordered a urine culture.  If this results positive you will be notified.  Please call your gastroenterologist in the morning to make follow up for 2 weeks.  Do not hesitate to return to the ED or call 911 if you experience: -Worsening symptoms -Worsening lightheadedness -Lightheadedness, passing out -Fevers/chills -Anything else that concerns you

## 2022-08-04 NOTE — ED Triage Notes (Signed)
Pt arrives pov with daughter, who endorses that pt was not acting wnl, states pt left spouse and was lying on couch, also hs been verbally "angry". Endorses concern for UTI. Pt denies symptoms

## 2022-08-04 NOTE — ED Notes (Signed)
Pt's CBG result was 195. Informed Gina - RN.

## 2022-08-04 NOTE — ED Provider Notes (Signed)
Amber Crosby   CSN: [id number] Arrival date & time: 08/04/22  1411     History  Chief Complaint  Patient presents with   Altered Mental Status    Amber Crosby is a 76 y.o. female with T2DM, HLD, anemia, colon cancer, portal hypertension with esophageal varices, portal vein thrombosis presents with AMS.   Pt arrives pov with daughter, who endorses that pt was not acting normally, states pt left spouse and was lying on couch, also has been verbally "angry". Daughter at bedside reports that patient was talking about neighbor making peanut butter crackers, talking about nonsensical things. No report of trauma, falls, stroke-like symptoms, or LOC. Patient has a history of portal hypertension, Sees liver specialist through [facility]. Does not take lactulose at home.Endorses concern for UTI, as she has been confused with UTIs in the past. She has not had any fever/chills, abdominal pain, N/V/D/C, urinary symptoms like dysuria/hematuria, melena/hematochezia, SOB/cough, CP that the daughter knows of. Patient denies any pain anywhere currently and states she feels fine. She is A&Ox2 at this time.    Impression from her endoscopy on 07/22/22: Impression: - Portal hypertensive gastropathy. - Duodenitis. - No specimens collected. Recommendation: - Discharge patient to home (with escort). - Continue present medications. - Use Prilosec (omeprazole) 20 mg PO daily for 8 weeks for duodenitis.   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 She has been described as non-cirrhotic in the past but more recent imaging has a nodular contour of the liver. Grade 1 varices and anticoagulated due to PVT. Normal liver synthetic function. Plan:  -This patient was reviewed with Dr Marene Lenz -MRI scheduled -EGD ordered -continue current medications -update labs and CBC -check iron labs  -RTC 6-1 months     Altered Mental  Status      Home Medications Prior to Admission medications   Medication Sig Start Date End Date Taking? Authorizing Provider  alendronate (FOSAMAX) 70 MG tablet  03/11/22   [provider]  ALPRAZolam (XANAX) 0.25 MG tablet Take 1 tablet (0.25 mg total) by mouth at bedtime as needed for anxiety. 10/23/15   Darlin Coco, MD  amLODipine (NORVASC) 5 MG tablet TAKE 1 TABLET (5 MG TOTAL) BY MOUTH DAILY. NEED APPOINTMENT 08/02/22   Skeet Latch, MD  apixaban (ELIQUIS) 5 MG TABS tablet Take 5 mg by mouth 2 (two) times daily.    [provider]  benzonatate (TESSALON) 100 MG capsule Take 1 capsule (100 mg total) by mouth every 8 (eight) hours. 07/02/22   Redwine, Madison A, PA-C  Cholecalciferol (VITAMIN D PO) Take 1 tablet by mouth daily.     [provider]  loperamide (IMODIUM A-D) 2 MG tablet Take 1 tablet (2 mg total) by mouth 4 (four) times daily as needed for diarrhea or loose stools. 01/03/21   Fredia Sorrow, MD  losartan-hydrochlorothiazide (HYZAAR) 100-12.5 MG tablet Take 1 tablet by mouth daily. Must keep appointment for further refills. 03/29/21   Loel Dubonnet, NP  metFORMIN (GLUCOPHAGE) 500 MG tablet Take 1,000 mg by mouth daily with breakfast.     [provider]  nadolol (CORGARD) 40 MG tablet Take 120 mg by mouth daily.     [provider]  ondansetron (ZOFRAN ODT) 4 MG disintegrating tablet Take 1 tablet (4 mg total) by mouth every 8 (eight) hours as needed for nausea or vomiting. 01/03/21   Fredia Sorrow, MD  ondansetron (ZOFRAN) 8 MG tablet Take 8 mg  by mouth every 8 (eight) hours as needed for nausea or vomiting.    [provider]  rosuvastatin (CRESTOR) 5 MG tablet TAKE 1 TABLET (5 MG TOTAL) BY MOUTH DAILY. 06/22/21   Skeet Latch, MD      Allergies    Meperidine and related, Other, Contrast media [iodinated contrast media], Iohexol, and Morphine and related    Review of Systems   Review of  Systems Review of systems negative for f/c.  A 10 point review of systems was performed and is negative unless otherwise reported in HPI.  Physical Exam Updated Vital Signs BP (!) 167/76 (BP Location: Left Arm)   Pulse 88   Temp 98.5 F (36.9 C)   Resp 16   Wt 64 kg   SpO2 100%   BMI 25.79 kg/m  Physical Exam General: Normal appearing elderly female, lying in bed.  HEENT: PERRLA, EOMI, Sclera anicteric, MMM, trachea midline. Cardiology: RRR, no murmurs/rubs/gallops. BL radial and DP pulses equal bilaterally.  Resp: Normal respiratory rate and effort. CTAB, no wheezes, rhonchi, crackles.  Abd: Soft, non-tender, non-distended. No rebound tenderness or guarding.  GU: Deferred. MSK: No peripheral edema or signs of trauma. Extremities without deformity or TTP. No cyanosis or clubbing. Skin: warm, dry. No rashes or lesions. Back: No CVA tenderness Neuro: A&Ox2, CNs II-XII grossly intact. MAEs symmetrically. Sensation grossly intact. Intact coordination. Psych: Normal mood and affect.   ED Results / Procedures / Treatments   Labs (all labs ordered are listed, but only abnormal results are displayed) Labs Reviewed  URINE CULTURE - Abnormal; Notable for the following components:      Result Value   Culture   (*)    Value: >=100,000 COLONIES/mL KLEBSIELLA PNEUMONIAE 50,000 COLONIES/mL ESCHERICHIA COLI    Organism ID, Bacteria KLEBSIELLA PNEUMONIAE (*)    Organism ID, Bacteria ESCHERICHIA COLI (*)    All other components within normal limits  URINALYSIS, ROUTINE W REFLEX MICROSCOPIC - Abnormal; Notable for the following components:   APPearance HAZY (*)    Glucose, UA >1,000 (*)    Leukocytes,Ua TRACE (*)    Bacteria, UA RARE (*)    All other components within normal limits  COMPREHENSIVE METABOLIC PANEL - Abnormal; Notable for the following components:   CO2 21 (*)    Glucose, Bld 225 (*)    Total Bilirubin 2.3 (*)    All other components within normal limits  CBC - Abnormal;  Notable for the following components:   Platelets 83 (*)    All other components within normal limits  AMMONIA - Abnormal; Notable for the following components:   Ammonia 91 (*)    All other components within normal limits  SALICYLATE LEVEL - Abnormal; Notable for the following components:   Salicylate Lvl <3.2 (*)    All other components within normal limits  ACETAMINOPHEN LEVEL - Abnormal; Notable for the following components:   Acetaminophen (Tylenol), Serum <10 (*)    All other components within normal limits  CBG MONITORING, ED - Abnormal; Notable for the following components:   Glucose-Capillary 195 (*)    All other components within normal limits  MAGNESIUM  ETHANOL    EKG EKG Interpretation  Date/Time:  Wednesday August 04 2022 16:31:06 EST Ventricular Rate:  85 PR Interval:  132 QRS Duration: 91 QT Interval:  380 QTC Calculation: 452 R Axis:   37 Text Interpretation: Sinus rhythm Confirmed by Cindee Lame [phone]) on 08/04/2022 5:50:49 PM  Radiology No results found.  Procedures Procedures  Medications Ordered in ED Medications  lactulose (CHRONULAC) 10 GM/15ML solution 20 g (20 g Oral Given 08/04/22 1826)    ED Course/ Medical Decision Making/ A&P                          Medical Decision Making Amount and/or Complexity of Data Reviewed Labs: ordered. Decision-making details documented in ED Course.  Risk Prescription drug management.   Patient is HDS, afebrile, and non-toxic appearing. She is A&Ox2.   This patient presents to the ED for concern of altered mental status, this involves an extensive number of treatment options, and is a complaint that carries with it a high risk of complications and morbidity.  I considered the following differential and admission for this acute, potentially life threatening condition.    MDM:    Ddx of acute altered mental status or encephalopathy considered but not limited to: -Given her h/o liver disease and  possible progression to cirrhosis, consider hepatic encephalopathy at top of differential and will test for ammonia. -Intracranial abnormalities such as ICH, hydrocephalus, head trauma - she is focally neuro intact other than A&Ox2, no e/o trauma on exam. If ammonia is normal and no other cause can be found will likely evaluate with CTH, but much higher suspicion for hepatic encephalopathy. -Infection such as UTI, PNA, or meningitis - she is afebrile, no localizing infectious symptoms reported, overall non-toxic appearing but will test for UTI given history -Toxic ingestion such as opioid overdose, anticholinergic toxicity - normal pupils, no new medications.  -Electrolyte abnormalities or hyper/hypoglycemia -ACS or arrhythmia - no associated CP or SOB. Will evaluate w/ EKG.    In 2019, urine that wasn't particularly indicative of UTI had a urine culture that grew klebsiella, was treated with keflex. Had another UTI klebsiella later that presented exactly this way.   UA with no e/o UTI today. Ammonia elevated. Will d/w GI recs for lactulose.   Clinical Course as of 08/24/22 1031  Wed Aug 04, 2022  1606 Urinalysis, Routine w reflex microscopic Urine, Clean Catch(!) No e/o UTI [HN]  1750 Ammonia(!): 91 [HN]  1750 WBC: 4.7 [HN]  1750 Hemoglobin: 12.7 [HN]  1750 Glucose-Capillary(!): 195 [HN]  1750 Alcohol, Ethyl (B): <10 [HN]  1750 Acetaminophen (Tylenol), S(!): <70 [HN]  2637 Salicylate Lvl(!): <8.5 [HN]  1750 Sodium: 136 [HN]  1750 Potassium: 3.8 [HN]  1750 Creatinine: 0.61 [HN]  1750 BUN: 16 [HN]  1750 Total Bilirubin(!): 2.3 Down from prior [HN]  1817 Paged GI [HN]  1818 Ordered 20 g lactulose PO [HN]  1825 GI recommending lactulose x 2 weeks titrated to 3-4 bowel movements per day. Can start with 15 mL 2-3 times per day. If she doesn't have enough she can increase up to 30 mL 3 times per day.  [HN]    Clinical Course User Index [HN] Audley Hose, MD    Labs: I Ordered, and  personally interpreted labs.  The pertinent results include those listed above.  Additional history obtained from daughter at bedside.  External records from outside source obtained and reviewed including GI notes  Cardiac Monitoring: The patient was maintained on a cardiac monitor.  I personally viewed and interpreted the cardiac monitored which showed an underlying rhythm of: NSR  Social Determinants of Health: Patient lives independently  Disposition:  Patient is given dose of lactulose here and will be DC'd with lactulose rx as above into care of her daughter who will be helping to  care for her mother until she is improved. Patient's daughter and she are given DC instructions/return precautions. Instructed to call their [facility] liver specialist and make a f/u appointment. They report understanding. All questions answered to their satisfaction.    Co morbidities that complicate the patient evaluation  Past Medical History:  Diagnosis Date   Anxiety    Colon cancer ([place])    Without evidence of recurrence   Diabetes mellitus    Type 2   Ear infection    Essential hypertension    Hemorrhoids    Internal-external small hemorrhoids   Hypercholesterolemia    Portal vein thrombosis 01/29/2020     Medicines Meds ordered this encounter  Medications   lactulose (CHRONULAC) 10 GM/15ML solution 20 g   Lactulose 20 GM/30ML SOLN    Sig: Take 15 mLs (10 g total) by mouth 2 (two) times daily. Please titrate dose to 3-4 bowel movements per day. You can start with 15 mL 2-3 times per day. If you don't have enough bowel movements with that dose, you can increase up to 30 mL 3 times per day.    Dispense:  946 mL    Refill:  1    I have reviewed the patients home medicines and have made adjustments as needed  Problem List / ED Course: Problem List Items Addressed This Visit   None Visit Diagnoses     Hyperammonemia ([place])    -  Primary   Hyperbilirubinemia                 This Crosby  was created using dictation software, which may contain spelling or grammatical errors.    Audley Hose, MD 08/24/22 1040

## 2022-08-08 LAB — URINE CULTURE: Culture: >=100000 — AB

## 2022-08-09 ENCOUNTER — Telehealth (HOSPITAL_BASED_OUTPATIENT_CLINIC_OR_DEPARTMENT_OTHER): Payer: Self-pay | Admitting: *Deleted

## 2022-08-09 NOTE — Telephone Encounter (Signed)
Post ED Visit - Positive Culture Follow-up  Culture report reviewed by antimicrobial stewardship pharmacist: [place] Team '[]'$  Elenor Quinones, Pharm.D. '[]'$  Heide Guile, Pharm.D., BCPS AQ-ID '[]'$  [place] Neptune, Pharm.D., BCPS '[]'$  Alycia Rossetti, Pharm.D., BCPS '[]'$  [place], Pharm.D., BCPS, AAHIVP '[]'$  Legrand Como, Pharm.D., BCPS, AAHIVP '[]'$  Salome Arnt, PharmD, BCPS '[]'$  Johnnette Gourd, PharmD, BCPS '[]'$  Hughes Better, PharmD, BCPS '[]'$  Leeroy Cha, PharmD '[]'$  Laqueta Linden, PharmD, BCPS '[x]'$  Carl Best, PharmD  [place] Team '[]'$  Leodis Sias, PharmD '[]'$  Lindell Spar, PharmD '[]'$  Royetta Asal, PharmD '[]'$  Graylin Shiver, Rph '[]'$  Rema Fendt) Glennon Mac, PharmD '[]'$  Arlyn Dunning, PharmD '[]'$  Netta Cedars, PharmD '[]'$  Dia Sitter, PharmD '[]'$  Leone Haven, PharmD '[]'$  Gretta Arab, PharmD '[]'$  Theodis Shove, PharmD '[]'$  Peggyann Juba, PharmD '[]'$  Reuel Boom, PharmD   Positive urine culture No further patient follow-up is required at this time.  Amber Crosby 08/09/2022, 10:12 AM

## 2022-08-09 NOTE — Progress Notes (Signed)
ED Antimicrobial Stewardship Positive Culture Follow Up   Amber Crosby is an 76 y.o. female who presented to [facility] on 08/04/2022 with a chief complaint of AMS.   Chief Complaint  Patient presents with   Altered Mental Status    Recent Results (from the past 720 hour(s))  Urine Culture     Status: Abnormal   Collection Time: 08/04/22  3:09 PM   Specimen: Urine, Clean Catch  Result Value Ref Range Status   Specimen Description   Final    URINE, CLEAN CATCH Performed at [facility], [address], [place], [place] [address]    Special Requests   Final    NONE Performed at [facility], [address] [place], [place] [address]    Culture (A)  Final    >=100,000 COLONIES/mL KLEBSIELLA PNEUMONIAE 50,000 COLONIES/mL ESCHERICHIA COLI    Report Status 08/08/2022 FINAL  Final   Organism ID, Bacteria KLEBSIELLA PNEUMONIAE (A)  Final   Organism ID, Bacteria ESCHERICHIA COLI (A)  Final      Susceptibility   Escherichia coli - MIC*    AMPICILLIN <=2 SENSITIVE Sensitive     CEFAZOLIN <=4 SENSITIVE Sensitive     CEFEPIME <=0.12 SENSITIVE Sensitive     CEFTRIAXONE <=0.25 SENSITIVE Sensitive     CIPROFLOXACIN <=0.25 SENSITIVE Sensitive     GENTAMICIN <=1 SENSITIVE Sensitive     IMIPENEM <=0.25 SENSITIVE Sensitive     NITROFURANTOIN <=16 SENSITIVE Sensitive     TRIMETH/SULFA <=20 SENSITIVE Sensitive     AMPICILLIN/SULBACTAM <=2 SENSITIVE Sensitive     PIP/TAZO <=4 SENSITIVE Sensitive     * 50,000 COLONIES/mL ESCHERICHIA COLI   Klebsiella pneumoniae - MIC*    AMPICILLIN RESISTANT Resistant     CEFAZOLIN <=4 SENSITIVE Sensitive     CEFEPIME <=0.12 SENSITIVE Sensitive     CEFTRIAXONE <=0.25 SENSITIVE Sensitive     CIPROFLOXACIN <=0.25 SENSITIVE Sensitive     GENTAMICIN <=1 SENSITIVE Sensitive     IMIPENEM <=0.25 SENSITIVE Sensitive     NITROFURANTOIN 64 INTERMEDIATE Intermediate     TRIMETH/SULFA <=20 SENSITIVE Sensitive      AMPICILLIN/SULBACTAM 4 SENSITIVE Sensitive     PIP/TAZO <=4 SENSITIVE Sensitive     * >=100,000 COLONIES/mL KLEBSIELLA PNEUMONIAE     '[x]'$  Patient discharged originally without antimicrobial agent. Treatment not indicated as patient did not report any urinary symptoms during ED encounter.   New antibiotic prescription: none  ED Provider: Dr. Sonia Side, DO    Gena Fray, PharmD PGY1 Pharmacy Resident  08/09/2022 9:38 AM Monday - Friday phone -  [phone] Saturday - Sunday phone - [phone]

## 2022-08-14 ENCOUNTER — Other Ambulatory Visit: Payer: Self-pay | Admitting: Cardiovascular Disease

## 2022-08-16 NOTE — Telephone Encounter (Signed)
Rx(s) sent to pharmacy electronically.  

## 2022-08-20 DIAGNOSIS — K7469 Other cirrhosis of liver: Secondary | ICD-10-CM | POA: Diagnosis not present

## 2022-09-06 ENCOUNTER — Encounter (HOSPITAL_BASED_OUTPATIENT_CLINIC_OR_DEPARTMENT_OTHER): Payer: Self-pay | Admitting: Emergency Medicine

## 2022-09-06 ENCOUNTER — Emergency Department (HOSPITAL_BASED_OUTPATIENT_CLINIC_OR_DEPARTMENT_OTHER): Payer: Medicare Other

## 2022-09-06 ENCOUNTER — Observation Stay (HOSPITAL_BASED_OUTPATIENT_CLINIC_OR_DEPARTMENT_OTHER)
Admission: EM | Admit: 2022-09-06 | Discharge: 2022-09-07 | Disposition: A | Discharge status: Home / Self Care | Payer: Medicare Other | Attending: Neurological Surgery | Admitting: Neurological Surgery

## 2022-09-06 ENCOUNTER — Other Ambulatory Visit: Payer: Self-pay

## 2022-09-06 DIAGNOSIS — S0181XA Laceration without foreign body of other part of head, initial encounter: Secondary | ICD-10-CM

## 2022-09-06 DIAGNOSIS — S06360A Traumatic hemorrhage of cerebrum, unspecified, without loss of consciousness, initial encounter: Secondary | ICD-10-CM | POA: Diagnosis not present

## 2022-09-06 DIAGNOSIS — Z86718 Personal history of other venous thrombosis and embolism: Secondary | ICD-10-CM | POA: Diagnosis not present

## 2022-09-06 DIAGNOSIS — Y9301 Activity, walking, marching and hiking: Secondary | ICD-10-CM | POA: Diagnosis not present

## 2022-09-06 DIAGNOSIS — S066X0A Traumatic subarachnoid hemorrhage without loss of consciousness, initial encounter: Secondary | ICD-10-CM | POA: Diagnosis not present

## 2022-09-06 DIAGNOSIS — Z23 Encounter for immunization: Secondary | ICD-10-CM | POA: Insufficient documentation

## 2022-09-06 DIAGNOSIS — Z7901 Long term (current) use of anticoagulants: Secondary | ICD-10-CM | POA: Diagnosis not present

## 2022-09-06 DIAGNOSIS — W01198A Fall on same level from slipping, tripping and stumbling with subsequent striking against other object, initial encounter: Secondary | ICD-10-CM | POA: Diagnosis not present

## 2022-09-06 DIAGNOSIS — Z85038 Personal history of other malignant neoplasm of large intestine: Secondary | ICD-10-CM | POA: Diagnosis not present

## 2022-09-06 DIAGNOSIS — M4802 Spinal stenosis, cervical region: Secondary | ICD-10-CM | POA: Diagnosis not present

## 2022-09-06 DIAGNOSIS — Z7984 Long term (current) use of oral hypoglycemic drugs: Secondary | ICD-10-CM | POA: Diagnosis not present

## 2022-09-06 DIAGNOSIS — I10 Essential (primary) hypertension: Secondary | ICD-10-CM | POA: Insufficient documentation

## 2022-09-06 DIAGNOSIS — M25562 Pain in left knee: Secondary | ICD-10-CM | POA: Diagnosis present

## 2022-09-06 DIAGNOSIS — R600 Localized edema: Secondary | ICD-10-CM | POA: Diagnosis not present

## 2022-09-06 DIAGNOSIS — Z79899 Other long term (current) drug therapy: Secondary | ICD-10-CM | POA: Diagnosis not present

## 2022-09-06 DIAGNOSIS — S0121XA Laceration without foreign body of nose, initial encounter: Secondary | ICD-10-CM | POA: Diagnosis not present

## 2022-09-06 DIAGNOSIS — M47812 Spondylosis without myelopathy or radiculopathy, cervical region: Secondary | ICD-10-CM | POA: Diagnosis not present

## 2022-09-06 DIAGNOSIS — E119 Type 2 diabetes mellitus without complications: Secondary | ICD-10-CM | POA: Insufficient documentation

## 2022-09-06 DIAGNOSIS — S8002XA Contusion of left knee, initial encounter: Secondary | ICD-10-CM | POA: Insufficient documentation

## 2022-09-06 DIAGNOSIS — I629 Nontraumatic intracranial hemorrhage, unspecified: Secondary | ICD-10-CM

## 2022-09-06 DIAGNOSIS — S0990XA Unspecified injury of head, initial encounter: Secondary | ICD-10-CM | POA: Diagnosis not present

## 2022-09-06 LAB — BASIC METABOLIC PANEL
Anion gap: 12 (ref 5–15)
BUN: 14 mg/dL (ref 8–23)
CO2: 23 mmol/L (ref 22–32)
Calcium: 9.6 mg/dL (ref 8.9–10.3)
Chloride: 100 mmol/L (ref 98–111)
Creatinine, Ser: 0.49 mg/dL (ref 0.44–1.00)
GFR, Estimated: >60 mL/min (ref >60)
Glucose, Bld: 186 mg/dL — ABNORMAL HIGH (ref 70–99)
Potassium: 3.6 mmol/L (ref 3.5–5.1)
Sodium: 135 mmol/L (ref 135–145)

## 2022-09-06 LAB — CBC
HCT: 32.3 % — ABNORMAL LOW (ref 36.0–46.0)
Hemoglobin: 10.9 g/dL — ABNORMAL LOW (ref 12.0–15.0)
MCH: 29.1 pg (ref 26.0–34.0)
MCHC: 33.7 g/dL (ref 30.0–36.0)
MCV: 86.1 fL (ref 80.0–100.0)
Platelets: 113 10*3/uL — ABNORMAL LOW (ref 150–400)
RBC: 3.75 MIL/uL — ABNORMAL LOW (ref 3.87–5.11)
RDW: 14.6 % (ref 11.5–15.5)
WBC: 4.6 10*3/uL (ref 4.0–10.5)
nRBC: 0 % (ref 0.0–0.2)

## 2022-09-06 LAB — PROTIME-INR
INR: 1.7 — ABNORMAL HIGH (ref 0.8–1.2)
Prothrombin Time: 19.6 seconds — ABNORMAL HIGH (ref 11.4–15.2)

## 2022-09-06 LAB — APTT: aPTT: 41 seconds — ABNORMAL HIGH (ref 24–36)

## 2022-09-06 LAB — HEPARIN LEVEL (UNFRACTIONATED): Heparin Unfractionated: >1.1 IU/mL — ABNORMAL HIGH (ref 0.30–0.70)

## 2022-09-06 MED ORDER — LIDOCAINE-EPINEPHRINE (PF) 2 %-1:200000 IJ SOLN
10.0000 mL | Freq: Once | INTRAMUSCULAR | Status: AC
  Administered 2022-09-06: 10 mL
  Filled 2022-09-06: qty 20

## 2022-09-06 MED ORDER — TETANUS-DIPHTH-ACELL PERTUSSIS 5-2.5-18.5 LF-MCG/0.5 IM SUSY
0.5000 mL | PREFILLED_SYRINGE | Freq: Once | INTRAMUSCULAR | Status: AC
  Administered 2022-09-06: 0.5 mL via INTRAMUSCULAR
  Filled 2022-09-06: qty 0.5

## 2022-09-06 MED ORDER — STERILE WATER FOR INJECTION IJ SOLN
INTRAMUSCULAR | Status: AC
  Filled 2022-09-06: qty 100

## 2022-09-06 MED ORDER — EMPTY CONTAINERS FLEXIBLE MISC
900.0000 mg | Freq: Once | Status: AC
  Administered 2022-09-06: 900 mg via INTRAVENOUS
  Filled 2022-09-06: qty 90

## 2022-09-06 MED ORDER — COAG FACT XA INACTIVATED-ZHZO 200 MG IV SOLR
INTRAVENOUS | Status: AC
  Filled 2022-09-06: qty 100

## 2022-09-06 NOTE — ED Provider Notes (Signed)
[place] EMERGENCY DEPT Provider Note   CSN: [id number] Arrival date & time: 09/06/22  1554     History  Chief Complaint  Patient presents with   Amber Crosby is a 76 y.o. female.   Fall  Patient lost balance walking down a driveway and fell striking her face and pain in her left knee.  Is on blood thinners.  Abrasion/laceration to nose.  No loss conscious.  Also pain in left knee but has been able to ambulate.  No neck pain.  No numbness weakness.    Past Medical History:  Diagnosis Date   Anxiety    Colon cancer [facility])    Without evidence of recurrence   Diabetes mellitus    Type 2   Ear infection    Essential hypertension    Hemorrhoids    Internal-external small hemorrhoids   Hypercholesterolemia    Portal vein thrombosis 01/29/2020    Home Medications Prior to Admission medications   Medication Sig Start Date End Date Taking? Authorizing Provider  alendronate (FOSAMAX) 70 MG tablet  03/11/22   [provider]  ALPRAZolam (XANAX) 0.25 MG tablet Take 1 tablet (0.25 mg total) by mouth at bedtime as needed for anxiety. 10/23/15   Darlin Coco, MD  amLODipine (NORVASC) 5 MG tablet Take 1 tablet (5 mg total) by mouth daily. NEED APPOINTMENT 08/16/22   Skeet Latch, MD  apixaban (ELIQUIS) 5 MG TABS tablet Take 5 mg by mouth 2 (two) times daily.    [provider]  benzonatate (TESSALON) 100 MG capsule Take 1 capsule (100 mg total) by mouth every 8 (eight) hours. 07/02/22   Redwine, Madison A, PA-C  Cholecalciferol (VITAMIN D PO) Take 1 tablet by mouth daily.     [provider]  loperamide (IMODIUM A-D) 2 MG tablet Take 1 tablet (2 mg total) by mouth 4 (four) times daily as needed for diarrhea or loose stools. 01/03/21   Fredia Sorrow, MD  losartan-hydrochlorothiazide (HYZAAR) 100-12.5 MG tablet Take 1 tablet by mouth daily. Must keep appointment for further refills. 03/29/21   Loel Dubonnet, NP  metFORMIN  (GLUCOPHAGE) 500 MG tablet Take 1,000 mg by mouth daily with breakfast.     [provider]  nadolol (CORGARD) 40 MG tablet Take 120 mg by mouth daily.     [provider]  ondansetron (ZOFRAN ODT) 4 MG disintegrating tablet Take 1 tablet (4 mg total) by mouth every 8 (eight) hours as needed for nausea or vomiting. 01/03/21   Fredia Sorrow, MD  ondansetron (ZOFRAN) 8 MG tablet Take 8 mg by mouth every 8 (eight) hours as needed for nausea or vomiting.    [provider]  rosuvastatin (CRESTOR) 5 MG tablet TAKE 1 TABLET (5 MG TOTAL) BY MOUTH DAILY. 06/22/21   Skeet Latch, MD      Allergies    Meperidine and related, Other, Contrast media [iodinated contrast media], Iohexol, and Morphine and related    Review of Systems   Review of Systems  Physical Exam Updated Vital Signs BP (!) 119/59   Pulse 70   Temp 97.8 F (36.6 C)   Resp 18   Wt 68 kg   SpO2 96%   BMI 27.44 kg/m  Physical Exam Vitals and nursing note reviewed.  HENT:     Head:     Comments: Approximately 1.5 cm Y-shaped laceration on bridge of nose. Eyes:     Extraocular Movements: Extraocular movements intact.  Cardiovascular:  Rate and Rhythm: Regular rhythm.  Pulmonary:     Effort: Pulmonary effort is normal.  Abdominal:     Tenderness: There is no abdominal tenderness.  Musculoskeletal:        General: Tenderness present.     Cervical back: No tenderness.     Comments: Tenderness to the left knee with some effusion.  Knee stable.  Pain mostly laterally.  Neurological:     Mental Status: She is alert.     ED Results / Procedures / Treatments   Labs (all labs ordered are listed, but only abnormal results are displayed) Labs Reviewed  APTT - Abnormal; Notable for the following components:      Result Value   aPTT 41 (*)    All other components within normal limits  PROTIME-INR - Abnormal; Notable for the following components:   Prothrombin Time 19.6 (*)    INR 1.7 (*)     All other components within normal limits  CBC - Abnormal; Notable for the following components:   RBC 3.75 (*)    Hemoglobin 10.9 (*)    HCT 32.3 (*)    Platelets 113 (*)    All other components within normal limits  BASIC METABOLIC PANEL - Abnormal; Notable for the following components:   Glucose, Bld 186 (*)    All other components within normal limits  HEPARIN LEVEL (UNFRACTIONATED) - Abnormal; Notable for the following components:   Heparin Unfractionated >1.10 (*)    All other components within normal limits    EKG None  Radiology CT Head Wo Contrast  Result Date: 09/06/2022 CLINICAL DATA:  Fall with nose laceration and swollen lips EXAM: CT HEAD WITHOUT CONTRAST CT MAXILLOFACIAL WITHOUT CONTRAST CT CERVICAL SPINE WITHOUT CONTRAST TECHNIQUE: Multidetector CT imaging of the head, cervical spine, and maxillofacial structures were performed using the standard protocol without intravenous contrast. Multiplanar CT image reconstructions of the cervical spine and maxillofacial structures were also generated. RADIATION DOSE REDUCTION: This exam was performed according to the departmental dose-optimization program which includes automated exposure control, adjustment of the mA and/or kV according to patient size and/or use of iterative reconstruction technique. COMPARISON:  None Available. FINDINGS: CT HEAD FINDINGS Brain: There is acute subarachnoid hemorrhage overlying the left frontal lobe primarily in the left precentral sulcus and a smaller amount of subarachnoid hemorrhage in the right central sulcus, overall small volume. There is no acute infarct. Background parenchymal volume is normal. The ventricles are normal in size. Gray-white differentiation is preserved. Extensive confluent hypodensity in the supratentorial white matter likely reflects sequela of chronic small-vessel ischemic change There is no mass lesion.  There is no mass effect or midline shift. Vascular: There is  calcification of the bilateral carotid siphons and vertebral arteries. Skull: Normal. Negative for fracture or focal lesion. Other: None. CT MAXILLOFACIAL FINDINGS Osseous: There is no acute facial bone fracture. There is no evidence of mandibular dislocation. There is no suspicious osseous lesion. Orbits: The globes and orbits are unremarkable. There is no retrobulbar hematoma. Sinuses: There is chronic right sphenoid sinusitis with complete opacification. There is mild mucosal thickening in the left sphenoid sinus. Trace fluid is seen in the right mastoid air cells. Soft tissues: Unremarkable. CT CERVICAL SPINE FINDINGS Alignment: Normal. There is no jumped or perched facet or other evidence of traumatic malalignment. Skull base and vertebrae: Skull base alignment is maintained. Vertebral body heights are preserved. There is no evidence of acute fracture. There is no suspicious osseous lesion. Soft tissues and spinal canal:  No prevertebral fluid or swelling. No visible canal hematoma. Disc levels: There is disc space narrowing degenerative endplate change D7-A1 and C6-C7. There is overall mild multilevel facet arthropathy. There is no evidence of high-grade spinal canal stenosis. Upper chest: There is mosaic attenuation in the lung apices suggesting air trapping/small airway disease. Other: None. IMPRESSION: 1. Small volume acute subarachnoid hemorrhage overlying the left frontal lobe primarily in the precentral sulcus, and smaller amount of subarachnoid hemorrhage in the right central sulcus. 2. No acute facial bone fracture. 3. No acute fracture or traumatic malalignment of the cervical spine. 4. Chronic right sphenoid sinusitis. 5. Mosaic attenuation in the lung apices suggesting air trapping/small airway disease. Critical Value/emergent results were called by telephone at the time of interpretation on 09/06/2022 at 7:14 pm to provider [facility] , who verbally acknowledged these results. Electronically  Signed   By: Valetta Mole M.D.   On: 09/06/2022 19:17   CT Maxillofacial Wo Contrast  Result Date: 09/06/2022 CLINICAL DATA:  Fall with nose laceration and swollen lips EXAM: CT HEAD WITHOUT CONTRAST CT MAXILLOFACIAL WITHOUT CONTRAST CT CERVICAL SPINE WITHOUT CONTRAST TECHNIQUE: Multidetector CT imaging of the head, cervical spine, and maxillofacial structures were performed using the standard protocol without intravenous contrast. Multiplanar CT image reconstructions of the cervical spine and maxillofacial structures were also generated. RADIATION DOSE REDUCTION: This exam was performed according to the departmental dose-optimization program which includes automated exposure control, adjustment of the mA and/or kV according to patient size and/or use of iterative reconstruction technique. COMPARISON:  None Available. FINDINGS: CT HEAD FINDINGS Brain: There is acute subarachnoid hemorrhage overlying the left frontal lobe primarily in the left precentral sulcus and a smaller amount of subarachnoid hemorrhage in the right central sulcus, overall small volume. There is no acute infarct. Background parenchymal volume is normal. The ventricles are normal in size. Gray-white differentiation is preserved. Extensive confluent hypodensity in the supratentorial white matter likely reflects sequela of chronic small-vessel ischemic change There is no mass lesion.  There is no mass effect or midline shift. Vascular: There is calcification of the bilateral carotid siphons and vertebral arteries. Skull: Normal. Negative for fracture or focal lesion. Other: None. CT MAXILLOFACIAL FINDINGS Osseous: There is no acute facial bone fracture. There is no evidence of mandibular dislocation. There is no suspicious osseous lesion. Orbits: The globes and orbits are unremarkable. There is no retrobulbar hematoma. Sinuses: There is chronic right sphenoid sinusitis with complete opacification. There is mild mucosal thickening in the left  sphenoid sinus. Trace fluid is seen in the right mastoid air cells. Soft tissues: Unremarkable. CT CERVICAL SPINE FINDINGS Alignment: Normal. There is no jumped or perched facet or other evidence of traumatic malalignment. Skull base and vertebrae: Skull base alignment is maintained. Vertebral body heights are preserved. There is no evidence of acute fracture. There is no suspicious osseous lesion. Soft tissues and spinal canal: No prevertebral fluid or swelling. No visible canal hematoma. Disc levels: There is disc space narrowing degenerative endplate change O8-N8 and C6-C7. There is overall mild multilevel facet arthropathy. There is no evidence of high-grade spinal canal stenosis. Upper chest: There is mosaic attenuation in the lung apices suggesting air trapping/small airway disease. Other: None. IMPRESSION: 1. Small volume acute subarachnoid hemorrhage overlying the left frontal lobe primarily in the precentral sulcus, and smaller amount of subarachnoid hemorrhage in the right central sulcus. 2. No acute facial bone fracture. 3. No acute fracture or traumatic malalignment of the cervical spine. 4. Chronic right sphenoid sinusitis.  5. Mosaic attenuation in the lung apices suggesting air trapping/small airway disease. Critical Value/emergent results were called by telephone at the time of interpretation on 09/06/2022 at 7:14 pm to provider [facility] , who verbally acknowledged these results. Electronically Signed   By: Valetta Mole M.D.   On: 09/06/2022 19:17   CT Cervical Spine Wo Contrast  Result Date: 09/06/2022 CLINICAL DATA:  Fall with nose laceration and swollen lips EXAM: CT HEAD WITHOUT CONTRAST CT MAXILLOFACIAL WITHOUT CONTRAST CT CERVICAL SPINE WITHOUT CONTRAST TECHNIQUE: Multidetector CT imaging of the head, cervical spine, and maxillofacial structures were performed using the standard protocol without intravenous contrast. Multiplanar CT image reconstructions of the cervical spine and  maxillofacial structures were also generated. RADIATION DOSE REDUCTION: This exam was performed according to the departmental dose-optimization program which includes automated exposure control, adjustment of the mA and/or kV according to patient size and/or use of iterative reconstruction technique. COMPARISON:  None Available. FINDINGS: CT HEAD FINDINGS Brain: There is acute subarachnoid hemorrhage overlying the left frontal lobe primarily in the left precentral sulcus and a smaller amount of subarachnoid hemorrhage in the right central sulcus, overall small volume. There is no acute infarct. Background parenchymal volume is normal. The ventricles are normal in size. Gray-white differentiation is preserved. Extensive confluent hypodensity in the supratentorial white matter likely reflects sequela of chronic small-vessel ischemic change There is no mass lesion.  There is no mass effect or midline shift. Vascular: There is calcification of the bilateral carotid siphons and vertebral arteries. Skull: Normal. Negative for fracture or focal lesion. Other: None. CT MAXILLOFACIAL FINDINGS Osseous: There is no acute facial bone fracture. There is no evidence of mandibular dislocation. There is no suspicious osseous lesion. Orbits: The globes and orbits are unremarkable. There is no retrobulbar hematoma. Sinuses: There is chronic right sphenoid sinusitis with complete opacification. There is mild mucosal thickening in the left sphenoid sinus. Trace fluid is seen in the right mastoid air cells. Soft tissues: Unremarkable. CT CERVICAL SPINE FINDINGS Alignment: Normal. There is no jumped or perched facet or other evidence of traumatic malalignment. Skull base and vertebrae: Skull base alignment is maintained. Vertebral body heights are preserved. There is no evidence of acute fracture. There is no suspicious osseous lesion. Soft tissues and spinal canal: No prevertebral fluid or swelling. No visible canal hematoma. Disc  levels: There is disc space narrowing degenerative endplate change O2-U2 and C6-C7. There is overall mild multilevel facet arthropathy. There is no evidence of high-grade spinal canal stenosis. Upper chest: There is mosaic attenuation in the lung apices suggesting air trapping/small airway disease. Other: None. IMPRESSION: 1. Small volume acute subarachnoid hemorrhage overlying the left frontal lobe primarily in the precentral sulcus, and smaller amount of subarachnoid hemorrhage in the right central sulcus. 2. No acute facial bone fracture. 3. No acute fracture or traumatic malalignment of the cervical spine. 4. Chronic right sphenoid sinusitis. 5. Mosaic attenuation in the lung apices suggesting air trapping/small airway disease. Critical Value/emergent results were called by telephone at the time of interpretation on 09/06/2022 at 7:14 pm to provider [facility] , who verbally acknowledged these results. Electronically Signed   By: Valetta Mole M.D.   On: 09/06/2022 19:17   DG Knee Complete 4 Views Left  Result Date: 09/06/2022 CLINICAL DATA:  Status post fall with knee swelling. EXAM: LEFT KNEE - COMPLETE 4+ VIEW COMPARISON:  None Available. FINDINGS: No evidence of fracture, dislocation, or joint effusion. Tibiofemoral chondrocalcinosis. Mild spurring of the patellofemoral compartment tibial spines.  There is generalized soft tissue edema. IMPRESSION: 1. Soft tissue edema without acute fracture or subluxation of the left knee. 2. Chondrocalcinosis and mild osteoarthritis. Electronically Signed   By: Keith Rake M.D.   On: 09/06/2022 18:12    Procedures Procedures    Medications Ordered in ED Medications  sterile water (preservative free) injection (  See Procedure Record 09/06/22 2124)  Coag Fact Xa Inactivated-zhzo (ANDEXXA) 200 MG injection (  See Procedure Record 09/06/22 2124)  lidocaine-EPINEPHrine (XYLOCAINE W/EPI) 2 %-1:200000 (PF) injection 10 mL (10 mLs Infiltration Given  09/06/22 1733)  Tdap (BOOSTRIX) injection 0.5 mL (0.5 mLs Intramuscular Given 09/06/22 1733)  coag fact Xa recombinant (ANDEXXA) low dose infusion 900 mg (900 mg Intravenous New Bag/Given 09/06/22 2104)    ED Course/ Medical Decision Making/ A&P                           Medical Decision Making Amount and/or Complexity of Data Reviewed Labs: ordered. Radiology: ordered.  Risk Prescription drug management. Decision regarding hospitalization.   Patient with fall.  Mechanical.  Laceration to bridge of nose.  Worry for intracranial injury or facial fractures.  Will get CT scan and will also get x-ray of left knee.  Head CT independently interpreted does show intracranial hemorrhage.  Discussed with radiologist.  Discussed with Dr. Reatha Armour from neurosurgery.  We think emergent reversal would be beneficial.  Andexxa given.  Will get basic blood work.  Last dose was this morning.  Also has laceration on nose.  However with closer examination appears that there is nothing to suture.  It appears that there is a central area that is missing.  No exposed bone however.  General wound care.  CRITICAL CARE Performed by: Davonna Belling Total critical care time: 30 minutes Critical care time was exclusive of separately billable procedures and treating other patients. Critical care was necessary to treat or prevent imminent or life-threatening deterioration. Critical care was time spent personally by me on the following activities: development of treatment plan with patient and/or surrogate as well as nursing, discussions with consultants, evaluation of patient's response to treatment, examination of patient, obtaining history from patient or surrogate, ordering and performing treatments and interventions, ordering and review of laboratory studies, ordering and review of radiographic studies, pulse oximetry and re-evaluation of patient's condition.         Final Clinical Impression(s) / ED  Diagnoses Final diagnoses:  Subarachnoid hemorrhage following injury, no loss of consciousness, initial encounter ([place])  Face lacerations, initial encounter  Contusion of left knee, initial encounter    Rx / DC Orders ED Discharge Orders     None         Davonna Belling, MD 09/06/22 2323

## 2022-09-06 NOTE — ED Triage Notes (Signed)
Pt here from home with c/o fall walking to the mail box lac to the nose and swollen lips , no loc , is on a blood thinner , also some swelling to he left knee , hit her face not her head

## 2022-09-06 NOTE — ED Notes (Signed)
Andexxa infusing at 2hr rate of 29m/hr. Pt denies infusion reactions/allergic reactions, speech normal, alert and oriented x 4. Pt talking with daughter who is at the bedside. Given warm blanket, denies other needs.

## 2022-09-06 NOTE — ED Notes (Signed)
Patients daughter Drue Dun would like to be called if patient has any reaction to the medication given. Her number is [phone].

## 2022-09-06 NOTE — ED Notes (Signed)
RN attempted to pull andexxa from pyxis and medication stocked is not sufficient for dose. MD pickering notified. Pt will now go ED to [place]. Family and pt notified of change in plan of care. Pt amb to the bathroom without difficulty. C/o pain in left knee but is able to walk with standby assist.

## 2022-09-07 ENCOUNTER — Inpatient Hospital Stay (HOSPITAL_COMMUNITY): Payer: Medicare Other

## 2022-09-07 DIAGNOSIS — S066X0A Traumatic subarachnoid hemorrhage without loss of consciousness, initial encounter: Secondary | ICD-10-CM | POA: Diagnosis not present

## 2022-09-07 DIAGNOSIS — W1830XA Fall on same level, unspecified, initial encounter: Secondary | ICD-10-CM | POA: Diagnosis not present

## 2022-09-07 DIAGNOSIS — I672 Cerebral atherosclerosis: Secondary | ICD-10-CM | POA: Diagnosis not present

## 2022-09-07 DIAGNOSIS — S066XAA Traumatic subarachnoid hemorrhage with loss of consciousness status unknown, initial encounter: Secondary | ICD-10-CM | POA: Diagnosis not present

## 2022-09-07 DIAGNOSIS — J323 Chronic sphenoidal sinusitis: Secondary | ICD-10-CM | POA: Diagnosis not present

## 2022-09-07 MED ORDER — ROSUVASTATIN CALCIUM 5 MG PO TABS: 5.0000 mg | ORAL_TABLET | Freq: Every day | ORAL | Status: DC

## 2022-09-07 MED ORDER — ACETAMINOPHEN 325 MG PO TABS: 650.0000 mg | ORAL_TABLET | Freq: Four times a day (QID) | ORAL | Status: DC | PRN

## 2022-09-07 MED ORDER — HYDROCODONE-ACETAMINOPHEN 5-325 MG PO TABS: 1.0000 | ORAL_TABLET | ORAL | Status: DC | PRN

## 2022-09-07 MED ORDER — ACETAMINOPHEN 650 MG RE SUPP: 650.0000 mg | Freq: Four times a day (QID) | RECTAL | Status: DC | PRN

## 2022-09-07 MED ORDER — ONDANSETRON 4 MG PO TBDP: 4.0000 mg | ORAL_TABLET | Freq: Three times a day (TID) | ORAL | Status: DC | PRN

## 2022-09-07 MED ORDER — APIXABAN 5 MG PO TABS: 5.0000 mg | ORAL_TABLET | Freq: Two times a day (BID) | ORAL | Status: DC

## 2022-09-07 MED ORDER — ALPRAZOLAM 0.25 MG PO TABS: 0.2500 mg | ORAL_TABLET | Freq: Every evening | ORAL | Status: DC | PRN

## 2022-09-07 MED ORDER — METFORMIN HCL 500 MG PO TABS: 1000.0000 mg | ORAL_TABLET | Freq: Every day | ORAL | Status: DC

## 2022-09-07 MED ORDER — LOSARTAN POTASSIUM 50 MG PO TABS: 100.0000 mg | ORAL_TABLET | Freq: Every day | ORAL | Status: DC

## 2022-09-07 MED ORDER — SODIUM CHLORIDE 0.9 % IV SOLN: INTRAVENOUS | Status: DC

## 2022-09-07 MED ORDER — AMLODIPINE BESYLATE 5 MG PO TABS: 5.0000 mg | ORAL_TABLET | Freq: Every day | ORAL | Status: DC

## 2022-09-07 MED ORDER — LOSARTAN POTASSIUM-HCTZ 100-12.5 MG PO TABS: 1.0000 | ORAL_TABLET | Freq: Every day | ORAL | Status: DC

## 2022-09-07 MED ORDER — NADOLOL 40 MG PO TABS
120.0000 mg | ORAL_TABLET | Freq: Every day | ORAL | Status: DC
  Filled 2022-09-07: qty 3

## 2022-09-07 MED ORDER — HYDROCHLOROTHIAZIDE 12.5 MG PO TABS: 12.5000 mg | ORAL_TABLET | Freq: Every day | ORAL | Status: DC

## 2022-09-07 MED ORDER — HYDROMORPHONE HCL 1 MG/ML IJ SOLN: 0.5000 mg | INTRAMUSCULAR | Status: DC | PRN

## 2022-09-07 NOTE — Progress Notes (Signed)
Pt to 4NP02 from [place] ED, pt transferred from stretcher to bed with no events, given CHG bath, changed into new gown, VSS, full assessment charted  Paged Neurosurgery to make MD aware of pt arrival

## 2022-09-07 NOTE — Discharge Summary (Signed)
Physician Discharge Summary  Patient ID: Amber Crosby MRN: [id number] DOB/AGE: Jun 06, 1946 76 y.o.  Admit date: 09/06/2022 Discharge date: 09/07/2022  Admission Diagnoses:  Traumatic subarachnoid hemorrhage, on Eliquis  Discharge Diagnoses:  Same Principal Problem:   Intracranial hemorrhage [facility])   Discharged Condition: Stable  Hospital Course:  Amber Crosby is a 76 y.o. female presenting after ground-level fall with traumatic subarachnoid hemorrhage and facial lacerations.  Her Eliquis is chronically been taken due to portal vein thrombosis, this was reversed in the emergency department due to the size of the subarachnoid hemorrhage.  She was monitored overnight, repeat CT brain was stable.  She had no neurologic complaints whatsoever, was at her neurologic baseline.  She was tolerating normal diet, pain was controlled on oral medication.  She was ambulating independently.  Treatments: Observation Discharge Exam: Blood pressure (!) 110/52, pulse 68, temperature 98.6 F (37 C), temperature source Oral, resp. rate 16, weight 68 kg, SpO2 94 %. Awake, alert, oriented x 3 Speech fluent, appropriate CN grossly intact 5/5 BUE/BLE Multiple small facial abrasions  Disposition: Discharge disposition: 01-Home or Self Care        Allergies as of 09/07/2022       Reactions   Meperidine And Related Nausea And Vomiting   Other Rash, Hives   Contrast Media [iodinated Contrast Media]    Pt can not have IV dye under any circumstances   Iohexol    Pt had reaction (hives) even with pre-meds, so patient can not have IV dye for any CT   Morphine And Related         Medication List     TAKE these medications    alendronate 70 MG tablet Commonly known as: FOSAMAX   ALPRAZolam 0.25 MG tablet Commonly known as: XANAX Take 1 tablet (0.25 mg total) by mouth at bedtime as needed for anxiety.   amLODipine 5 MG tablet Commonly known as: NORVASC Take 1 tablet (5 mg total) by  mouth daily. NEED APPOINTMENT   apixaban 5 MG Tabs tablet Commonly known as: ELIQUIS Take 1 tablet (5 mg total) by mouth 2 (two) times daily. Start taking on: September 17, 2022 What changed: These instructions start on September 17, 2022. If you are unsure what to do until then, ask your doctor or other care provider.   benzonatate 100 MG capsule Commonly known as: TESSALON Take 1 capsule (100 mg total) by mouth every 8 (eight) hours.   loperamide 2 MG tablet Commonly known as: Imodium A-D Take 1 tablet (2 mg total) by mouth 4 (four) times daily as needed for diarrhea or loose stools.   losartan-hydrochlorothiazide 100-12.5 MG tablet Commonly known as: HYZAAR Take 1 tablet by mouth daily. Must keep appointment for further refills.   metFORMIN 500 MG tablet Commonly known as: GLUCOPHAGE Take 1,000 mg by mouth daily with breakfast.   nadolol 40 MG tablet Commonly known as: CORGARD Take 120 mg by mouth daily.   ondansetron 4 MG disintegrating tablet Commonly known as: Zofran ODT Take 1 tablet (4 mg total) by mouth every 8 (eight) hours as needed for nausea or vomiting.   ondansetron 8 MG tablet Commonly known as: ZOFRAN Take 8 mg by mouth every 8 (eight) hours as needed for nausea or vomiting.   rosuvastatin 5 MG tablet Commonly known as: CRESTOR TAKE 1 TABLET (5 MG TOTAL) BY MOUTH DAILY.   VITAMIN D PO Take 1 tablet by mouth daily.        Follow-up Information  Scotlynn Noyes C, DO Follow up.   Why: As needed Contact information: [address] [place] [address] [phone]                 Signed: Theodoro Doing Sergei Delo 09/07/2022, 10:46 AM

## 2022-09-07 NOTE — ED Notes (Signed)
Pt med complete and flushed with 32m NS after infusion. Pt amb to the bathroom with standby assist to void. Denies urinary complaints.

## 2022-09-07 NOTE — H&P (Signed)
Providing Compassionate, Quality Care - Together  NEUROSURGERY HISTORY & PHYSICAL   Amber Crosby is an 75 y.o. female.   Chief Complaint: Ground-level fall HPI: This is a 76 year old female, with a history of colon cancer with portal venous thrombosis, on Eliquis that fell while getting her mail yesterday and hit her face.  She denies LOC, denies seizure activity.  Denies any headaches weakness numbness tingling nausea or vomiting or vision changes.  She did have significant facial bleeding.  She went to the emergency department CT brain revealed traumatic subarachnoid hemorrhage without mass effect.  She was given reversal agent for Eliquis and transferred to [facility] for observation.  At this time she denies any new complaints, states she only has some mild left knee pain.  Past Medical History:  Diagnosis Date   Anxiety    Colon cancer ([place])    Without evidence of recurrence   Diabetes mellitus    Type 2   Ear infection    Essential hypertension    Hemorrhoids    Internal-external small hemorrhoids   Hypercholesterolemia    Portal vein thrombosis 01/29/2020    Past Surgical History:  Procedure Laterality Date   ESOPHAGOGASTRODUODENOSCOPY   08/04/2010   VAGINAL HYSTERECTOMY  1979    Family History  Problem Relation Age of Onset   Hypertension Mother    Cancer Mother    Diabetes Mother    Social History:  reports that she has never smoked. She has never used smokeless tobacco. She reports that she does not drink alcohol and does not use drugs.  Allergies:  Allergies  Allergen Reactions   Meperidine And Related Nausea And Vomiting   Other Rash and Hives   Contrast Media [Iodinated Contrast Media]     Pt can not have IV dye under any circumstances   Iohexol     Pt had reaction (hives) even with pre-meds, so patient can not have IV dye for any CT    Morphine And Related     Medications Prior to Admission  Medication Sig Dispense Refill   alendronate  (FOSAMAX) 70 MG tablet      ALPRAZolam (XANAX) 0.25 MG tablet Take 1 tablet (0.25 mg total) by mouth at bedtime as needed for anxiety. 90 tablet 0   amLODipine (NORVASC) 5 MG tablet Take 1 tablet (5 mg total) by mouth daily. NEED APPOINTMENT 15 tablet 0   apixaban (ELIQUIS) 5 MG TABS tablet Take 5 mg by mouth 2 (two) times daily.     benzonatate (TESSALON) 100 MG capsule Take 1 capsule (100 mg total) by mouth every 8 (eight) hours. 21 capsule 0   Cholecalciferol (VITAMIN D PO) Take 1 tablet by mouth daily.      loperamide (IMODIUM A-D) 2 MG tablet Take 1 tablet (2 mg total) by mouth 4 (four) times daily as needed for diarrhea or loose stools. 30 tablet 0   losartan-hydrochlorothiazide (HYZAAR) 100-12.5 MG tablet Take 1 tablet by mouth daily. Must keep appointment for further refills. 90 tablet 0   metFORMIN (GLUCOPHAGE) 500 MG tablet Take 1,000 mg by mouth daily with breakfast.      nadolol (CORGARD) 40 MG tablet Take 120 mg by mouth daily.      ondansetron (ZOFRAN ODT) 4 MG disintegrating tablet Take 1 tablet (4 mg total) by mouth every 8 (eight) hours as needed for nausea or vomiting. 12 tablet 1   ondansetron (ZOFRAN) 8 MG tablet Take 8 mg by mouth every 8 (eight)  hours as needed for nausea or vomiting.     rosuvastatin (CRESTOR) 5 MG tablet TAKE 1 TABLET (5 MG TOTAL) BY MOUTH DAILY. 90 tablet 3    Results for orders placed or performed during the hospital encounter of 09/06/22 (from the past 48 hour(s))  APTT     Status: Abnormal   Collection Time: 09/06/22  7:56 PM  Result Value Ref Range   aPTT 41 (H) 24 - 36 seconds    Comment:        IF BASELINE aPTT IS ELEVATED, SUGGEST PATIENT RISK ASSESSMENT BE USED TO DETERMINE APPROPRIATE ANTICOAGULANT THERAPY. Performed at [facility], [address], [place], [place] [address]   Protime-INR     Status: Abnormal   Collection Time: 09/06/22  7:56 PM  Result Value Ref Range   Prothrombin Time 19.6 (H) 11.4 - 15.2 seconds    INR 1.7 (H) 0.8 - 1.2    Comment: (NOTE) INR goal varies based on device and disease states. Performed at [facility], [address], [place], [place] [address]   CBC     Status: Abnormal   Collection Time: 09/06/22  7:56 PM  Result Value Ref Range   WBC 4.6 4.0 - 10.5 K/uL   RBC 3.75 (L) 3.87 - 5.11 MIL/uL   Hemoglobin 10.9 (L) 12.0 - 15.0 g/dL   HCT 32.3 (L) 36.0 - 46.0 %   MCV 86.1 80.0 - 100.0 fL   MCH 29.1 26.0 - 34.0 pg   MCHC 33.7 30.0 - 36.0 g/dL   RDW 14.6 11.5 - 15.5 %   Platelets 113 (L) 150 - 400 K/uL    Comment: REPEATED TO VERIFY   nRBC 0.0 0.0 - 0.2 %    Comment: Performed at [facility], [address] [place], [place] [address]  Basic metabolic panel     Status: Abnormal   Collection Time: 09/06/22  7:56 PM  Result Value Ref Range   Sodium 135 135 - 145 mmol/L   Potassium 3.6 3.5 - 5.1 mmol/L   Chloride 100 98 - 111 mmol/L   CO2 23 22 - 32 mmol/L   Glucose, Bld 186 (H) 70 - 99 mg/dL    Comment: Glucose reference range applies only to samples taken after fasting for at least 8 hours.   BUN 14 8 - 23 mg/dL   Creatinine, Ser 0.49 0.44 - 1.00 mg/dL   Calcium 9.6 8.9 - 10.3 mg/dL   GFR, Estimated >60 >60 mL/min    Comment: (NOTE) Calculated using the CKD-EPI Creatinine Equation (2021)    Anion gap 12 5 - 15    Comment: Performed at [facility], [place], [place] [address]  Heparin level (unfractionated) - if patient on rivaoxaban (Xarelto) or apixaban (Eliquis)     Status: Abnormal   Collection Time: 09/06/22  8:33 PM  Result Value Ref Range   Heparin Unfractionated >1.10 (H) 0.30 - 0.70 IU/mL    Comment: (NOTE) The clinical reportable range upper limit is being lowered to >1.10 to align with the FDA approved guidance for the current laboratory assay.  If heparin results are below expected values, and patient dosage has  been confirmed, suggest follow up testing of  antithrombin III levels. Performed at [facility], [place] [address] [place], [place] [address]    CT HEAD WO CONTRAST (5MM)  Result Date: 09/07/2022 CLINICAL DATA:  Subarachnoid hemorrhage follow-up EXAM: CT HEAD WITHOUT CONTRAST TECHNIQUE: Contiguous axial images  were obtained from the base of the skull through the vertex without intravenous contrast. RADIATION DOSE REDUCTION: This exam was performed according to the departmental dose-optimization program which includes automated exposure control, adjustment of the mA and/or kV according to patient size and/or use of iterative reconstruction technique. COMPARISON:  09/06/2022 CT head FINDINGS: Brain: Redemonstrated acute subarachnoid hemorrhage overlying the left frontal lobe, primarily in the left precentral sulcus. Also again noted is a small amount of acute subarachnoid hemorrhage in the right central sulcus. No significant change in subarachnoid hemorrhage volume. No acute infarct, mass, mass effect, or midline shift. No hydrocephalus. Periventricular white matter changes, likely the sequela of chronic small vessel ischemic disease. Vascular: No hyperdense vessel. Atherosclerotic calcifications in the intracranial carotid and vertebral arteries. Skull: No fracture or focal lesion. Sinuses/Orbits: Chronic right sphenoid sinusitis, with complete opacification. Mucosal thickening left sphenoid sinus. No acute finding in the orbits. Other: Trace fluid in right mastoid air cells. IMPRESSION: Unchanged acute subarachnoid hemorrhage overlying the left frontal lobe and in the right central sulcus. No mass effect or midline shift. Electronically Signed   By: Merilyn Baba M.D.   On: 09/07/2022 03:59   CT Head Wo Contrast  Result Date: 09/06/2022 CLINICAL DATA:  Fall with nose laceration and swollen lips EXAM: CT HEAD WITHOUT CONTRAST CT MAXILLOFACIAL WITHOUT CONTRAST CT CERVICAL SPINE WITHOUT CONTRAST TECHNIQUE: Multidetector CT imaging of the head,  cervical spine, and maxillofacial structures were performed using the standard protocol without intravenous contrast. Multiplanar CT image reconstructions of the cervical spine and maxillofacial structures were also generated. RADIATION DOSE REDUCTION: This exam was performed according to the departmental dose-optimization program which includes automated exposure control, adjustment of the mA and/or kV according to patient size and/or use of iterative reconstruction technique. COMPARISON:  None Available. FINDINGS: CT HEAD FINDINGS Brain: There is acute subarachnoid hemorrhage overlying the left frontal lobe primarily in the left precentral sulcus and a smaller amount of subarachnoid hemorrhage in the right central sulcus, overall small volume. There is no acute infarct. Background parenchymal volume is normal. The ventricles are normal in size. Gray-white differentiation is preserved. Extensive confluent hypodensity in the supratentorial white matter likely reflects sequela of chronic small-vessel ischemic change There is no mass lesion.  There is no mass effect or midline shift. Vascular: There is calcification of the bilateral carotid siphons and vertebral arteries. Skull: Normal. Negative for fracture or focal lesion. Other: None. CT MAXILLOFACIAL FINDINGS Osseous: There is no acute facial bone fracture. There is no evidence of mandibular dislocation. There is no suspicious osseous lesion. Orbits: The globes and orbits are unremarkable. There is no retrobulbar hematoma. Sinuses: There is chronic right sphenoid sinusitis with complete opacification. There is mild mucosal thickening in the left sphenoid sinus. Trace fluid is seen in the right mastoid air cells. Soft tissues: Unremarkable. CT CERVICAL SPINE FINDINGS Alignment: Normal. There is no jumped or perched facet or other evidence of traumatic malalignment. Skull base and vertebrae: Skull base alignment is maintained. Vertebral body heights are preserved.  There is no evidence of acute fracture. There is no suspicious osseous lesion. Soft tissues and spinal canal: No prevertebral fluid or swelling. No visible canal hematoma. Disc levels: There is disc space narrowing degenerative endplate change W1-X9 and C6-C7. There is overall mild multilevel facet arthropathy. There is no evidence of high-grade spinal canal stenosis. Upper chest: There is mosaic attenuation in the lung apices suggesting air trapping/small airway disease. Other: None. IMPRESSION: 1. Small volume acute subarachnoid hemorrhage overlying the  left frontal lobe primarily in the precentral sulcus, and smaller amount of subarachnoid hemorrhage in the right central sulcus. 2. No acute facial bone fracture. 3. No acute fracture or traumatic malalignment of the cervical spine. 4. Chronic right sphenoid sinusitis. 5. Mosaic attenuation in the lung apices suggesting air trapping/small airway disease. Critical Value/emergent results were called by telephone at the time of interpretation on 09/06/2022 at 7:14 pm to provider [facility] , who verbally acknowledged these results. Electronically Signed   By: Valetta Mole M.D.   On: 09/06/2022 19:17   CT Maxillofacial Wo Contrast  Result Date: 09/06/2022 CLINICAL DATA:  Fall with nose laceration and swollen lips EXAM: CT HEAD WITHOUT CONTRAST CT MAXILLOFACIAL WITHOUT CONTRAST CT CERVICAL SPINE WITHOUT CONTRAST TECHNIQUE: Multidetector CT imaging of the head, cervical spine, and maxillofacial structures were performed using the standard protocol without intravenous contrast. Multiplanar CT image reconstructions of the cervical spine and maxillofacial structures were also generated. RADIATION DOSE REDUCTION: This exam was performed according to the departmental dose-optimization program which includes automated exposure control, adjustment of the mA and/or kV according to patient size and/or use of iterative reconstruction technique. COMPARISON:  None  Available. FINDINGS: CT HEAD FINDINGS Brain: There is acute subarachnoid hemorrhage overlying the left frontal lobe primarily in the left precentral sulcus and a smaller amount of subarachnoid hemorrhage in the right central sulcus, overall small volume. There is no acute infarct. Background parenchymal volume is normal. The ventricles are normal in size. Gray-white differentiation is preserved. Extensive confluent hypodensity in the supratentorial white matter likely reflects sequela of chronic small-vessel ischemic change There is no mass lesion.  There is no mass effect or midline shift. Vascular: There is calcification of the bilateral carotid siphons and vertebral arteries. Skull: Normal. Negative for fracture or focal lesion. Other: None. CT MAXILLOFACIAL FINDINGS Osseous: There is no acute facial bone fracture. There is no evidence of mandibular dislocation. There is no suspicious osseous lesion. Orbits: The globes and orbits are unremarkable. There is no retrobulbar hematoma. Sinuses: There is chronic right sphenoid sinusitis with complete opacification. There is mild mucosal thickening in the left sphenoid sinus. Trace fluid is seen in the right mastoid air cells. Soft tissues: Unremarkable. CT CERVICAL SPINE FINDINGS Alignment: Normal. There is no jumped or perched facet or other evidence of traumatic malalignment. Skull base and vertebrae: Skull base alignment is maintained. Vertebral body heights are preserved. There is no evidence of acute fracture. There is no suspicious osseous lesion. Soft tissues and spinal canal: No prevertebral fluid or swelling. No visible canal hematoma. Disc levels: There is disc space narrowing degenerative endplate change Y4-M2 and C6-C7. There is overall mild multilevel facet arthropathy. There is no evidence of high-grade spinal canal stenosis. Upper chest: There is mosaic attenuation in the lung apices suggesting air trapping/small airway disease. Other: None. IMPRESSION:  1. Small volume acute subarachnoid hemorrhage overlying the left frontal lobe primarily in the precentral sulcus, and smaller amount of subarachnoid hemorrhage in the right central sulcus. 2. No acute facial bone fracture. 3. No acute fracture or traumatic malalignment of the cervical spine. 4. Chronic right sphenoid sinusitis. 5. Mosaic attenuation in the lung apices suggesting air trapping/small airway disease. Critical Value/emergent results were called by telephone at the time of interpretation on 09/06/2022 at 7:14 pm to provider [facility] , who verbally acknowledged these results. Electronically Signed   By: Valetta Mole M.D.   On: 09/06/2022 19:17   CT Cervical Spine Wo Contrast  Result Date: 09/06/2022  CLINICAL DATA:  Fall with nose laceration and swollen lips EXAM: CT HEAD WITHOUT CONTRAST CT MAXILLOFACIAL WITHOUT CONTRAST CT CERVICAL SPINE WITHOUT CONTRAST TECHNIQUE: Multidetector CT imaging of the head, cervical spine, and maxillofacial structures were performed using the standard protocol without intravenous contrast. Multiplanar CT image reconstructions of the cervical spine and maxillofacial structures were also generated. RADIATION DOSE REDUCTION: This exam was performed according to the departmental dose-optimization program which includes automated exposure control, adjustment of the mA and/or kV according to patient size and/or use of iterative reconstruction technique. COMPARISON:  None Available. FINDINGS: CT HEAD FINDINGS Brain: There is acute subarachnoid hemorrhage overlying the left frontal lobe primarily in the left precentral sulcus and a smaller amount of subarachnoid hemorrhage in the right central sulcus, overall small volume. There is no acute infarct. Background parenchymal volume is normal. The ventricles are normal in size. Gray-white differentiation is preserved. Extensive confluent hypodensity in the supratentorial white matter likely reflects sequela of chronic  small-vessel ischemic change There is no mass lesion.  There is no mass effect or midline shift. Vascular: There is calcification of the bilateral carotid siphons and vertebral arteries. Skull: Normal. Negative for fracture or focal lesion. Other: None. CT MAXILLOFACIAL FINDINGS Osseous: There is no acute facial bone fracture. There is no evidence of mandibular dislocation. There is no suspicious osseous lesion. Orbits: The globes and orbits are unremarkable. There is no retrobulbar hematoma. Sinuses: There is chronic right sphenoid sinusitis with complete opacification. There is mild mucosal thickening in the left sphenoid sinus. Trace fluid is seen in the right mastoid air cells. Soft tissues: Unremarkable. CT CERVICAL SPINE FINDINGS Alignment: Normal. There is no jumped or perched facet or other evidence of traumatic malalignment. Skull base and vertebrae: Skull base alignment is maintained. Vertebral body heights are preserved. There is no evidence of acute fracture. There is no suspicious osseous lesion. Soft tissues and spinal canal: No prevertebral fluid or swelling. No visible canal hematoma. Disc levels: There is disc space narrowing degenerative endplate change H4-R7 and C6-C7. There is overall mild multilevel facet arthropathy. There is no evidence of high-grade spinal canal stenosis. Upper chest: There is mosaic attenuation in the lung apices suggesting air trapping/small airway disease. Other: None. IMPRESSION: 1. Small volume acute subarachnoid hemorrhage overlying the left frontal lobe primarily in the precentral sulcus, and smaller amount of subarachnoid hemorrhage in the right central sulcus. 2. No acute facial bone fracture. 3. No acute fracture or traumatic malalignment of the cervical spine. 4. Chronic right sphenoid sinusitis. 5. Mosaic attenuation in the lung apices suggesting air trapping/small airway disease. Critical Value/emergent results were called by telephone at the time of  interpretation on 09/06/2022 at 7:14 pm to provider [facility] , who verbally acknowledged these results. Electronically Signed   By: Valetta Mole M.D.   On: 09/06/2022 19:17   DG Knee Complete 4 Views Left  Result Date: 09/06/2022 CLINICAL DATA:  Status post fall with knee swelling. EXAM: LEFT KNEE - COMPLETE 4+ VIEW COMPARISON:  None Available. FINDINGS: No evidence of fracture, dislocation, or joint effusion. Tibiofemoral chondrocalcinosis. Mild spurring of the patellofemoral compartment tibial spines. There is generalized soft tissue edema. IMPRESSION: 1. Soft tissue edema without acute fracture or subluxation of the left knee. 2. Chondrocalcinosis and mild osteoarthritis. Electronically Signed   By: Keith Rake M.D.   On: 09/06/2022 18:12    ROS All pertinent positives and negatives are listed HPI above  Blood pressure (!) 97/46, pulse 72, temperature 98.8 F (37.1 C),  temperature source Oral, resp. rate 16, weight 68 kg, SpO2 92 %. Physical Exam  Awake alert Oriented x 3, no acute distress Multiple small facial abrasions PERRLA Face symmetric Full strength in upper and lower extremities Sensory intact light touch Cranial nerves II through XII intact Speech fluent and appropriate  Assessment/Plan 76 year old female with  Traumatic subarachnoid hemorrhage, due to fall on Eliquis  -Repeat CT stable.  Recommend holding Eliquis for total of 10 days.  She is going to contact her liver transplant team for evaluation of continuing her Eliquis.  Neurologically she is stable, pain is controlled and overall she is doing quite well.  Repeat CT was stable.  Will plan discharge home today.  Thank you for allowing me to participate in this patient's care.  Please do not hesitate to call with questions or concerns.   Elwin Sleight, [facility] Cell: [phone]

## 2022-09-07 NOTE — Care Management Obs Status (Addendum)
[place] NOTIFICATION   Patient Details  Name: Amber Crosby MRN: [id number] Date of Birth: 16-Apr-1946   Medicare Observation Status Notification Given:   yes    Ninfa Meeker, RN 09/07/2022, 12:13 PM

## 2022-09-07 NOTE — Plan of Care (Signed)
  Problem: Education: Goal: Knowledge of General Education information will improve Description: Including pain rating scale, medication(s)/side effects and non-pharmacologic comfort measures Outcome: Adequate for Discharge   

## 2022-09-07 NOTE — Care Management CC44 (Signed)
Condition Code 44 Documentation Completed  Patient Details  Name: Amber Crosby MRN: [id number] Date of Birth: April 25, 1946   Condition Code 44 given:  Yes Patient signature on Condition Code 44 notice:  Yes Documentation of 2 MD's agreement:  Yes Code 44 added to claim:  Yes    Ninfa Meeker, RN 09/07/2022, 12:13 PM

## 2022-10-05 ENCOUNTER — Other Ambulatory Visit: Payer: Self-pay

## 2022-10-05 ENCOUNTER — Emergency Department (HOSPITAL_COMMUNITY): Payer: Medicare Other

## 2022-10-05 ENCOUNTER — Inpatient Hospital Stay (HOSPITAL_COMMUNITY)
Admission: EM | Admit: 2022-10-05 | Discharge: 2022-10-11 | DRG: 689 | Disposition: A | Discharge status: Home / Self Care | Payer: Medicare Other | Attending: Family Medicine | Admitting: Family Medicine

## 2022-10-05 DIAGNOSIS — K766 Portal hypertension: Secondary | ICD-10-CM | POA: Diagnosis not present

## 2022-10-05 DIAGNOSIS — I619 Nontraumatic intracerebral hemorrhage, unspecified: Secondary | ICD-10-CM | POA: Diagnosis not present

## 2022-10-05 DIAGNOSIS — Z833 Family history of diabetes mellitus: Secondary | ICD-10-CM

## 2022-10-05 DIAGNOSIS — D649 Anemia, unspecified: Secondary | ICD-10-CM | POA: Insufficient documentation

## 2022-10-05 DIAGNOSIS — E7800 Pure hypercholesterolemia, unspecified: Secondary | ICD-10-CM | POA: Diagnosis present

## 2022-10-05 DIAGNOSIS — S066XAD Traumatic subarachnoid hemorrhage with loss of consciousness status unknown, subsequent encounter: Secondary | ICD-10-CM | POA: Diagnosis not present

## 2022-10-05 DIAGNOSIS — K7460 Unspecified cirrhosis of liver: Secondary | ICD-10-CM | POA: Diagnosis not present

## 2022-10-05 DIAGNOSIS — E876 Hypokalemia: Secondary | ICD-10-CM | POA: Diagnosis not present

## 2022-10-05 DIAGNOSIS — R930 Abnormal findings on diagnostic imaging of skull and head, not elsewhere classified: Secondary | ICD-10-CM

## 2022-10-05 DIAGNOSIS — I81 Portal vein thrombosis: Secondary | ICD-10-CM | POA: Diagnosis present

## 2022-10-05 DIAGNOSIS — Z885 Allergy status to narcotic agent status: Secondary | ICD-10-CM

## 2022-10-05 DIAGNOSIS — Z1152 Encounter for screening for COVID-19: Secondary | ICD-10-CM | POA: Diagnosis not present

## 2022-10-05 DIAGNOSIS — E785 Hyperlipidemia, unspecified: Secondary | ICD-10-CM | POA: Diagnosis present

## 2022-10-05 DIAGNOSIS — Z9221 Personal history of antineoplastic chemotherapy: Secondary | ICD-10-CM

## 2022-10-05 DIAGNOSIS — E861 Hypovolemia: Secondary | ICD-10-CM | POA: Diagnosis present

## 2022-10-05 DIAGNOSIS — D696 Thrombocytopenia, unspecified: Secondary | ICD-10-CM | POA: Insufficient documentation

## 2022-10-05 DIAGNOSIS — Z87440 Personal history of urinary (tract) infections: Secondary | ICD-10-CM

## 2022-10-05 DIAGNOSIS — E871 Hypo-osmolality and hyponatremia: Secondary | ICD-10-CM | POA: Diagnosis not present

## 2022-10-05 DIAGNOSIS — R509 Fever, unspecified: Secondary | ICD-10-CM | POA: Diagnosis not present

## 2022-10-05 DIAGNOSIS — R059 Cough, unspecified: Secondary | ICD-10-CM | POA: Diagnosis not present

## 2022-10-05 DIAGNOSIS — K7682 Hepatic encephalopathy: Secondary | ICD-10-CM | POA: Diagnosis present

## 2022-10-05 DIAGNOSIS — G9341 Metabolic encephalopathy: Secondary | ICD-10-CM | POA: Diagnosis not present

## 2022-10-05 DIAGNOSIS — Z7982 Long term (current) use of aspirin: Secondary | ICD-10-CM | POA: Diagnosis not present

## 2022-10-05 DIAGNOSIS — I8500 Esophageal varices without bleeding: Secondary | ICD-10-CM | POA: Diagnosis not present

## 2022-10-05 DIAGNOSIS — J323 Chronic sphenoidal sinusitis: Secondary | ICD-10-CM | POA: Diagnosis not present

## 2022-10-05 DIAGNOSIS — Z809 Family history of malignant neoplasm, unspecified: Secondary | ICD-10-CM | POA: Diagnosis not present

## 2022-10-05 DIAGNOSIS — J90 Pleural effusion, not elsewhere classified: Secondary | ICD-10-CM | POA: Diagnosis not present

## 2022-10-05 DIAGNOSIS — I10 Essential (primary) hypertension: Secondary | ICD-10-CM | POA: Diagnosis present

## 2022-10-05 DIAGNOSIS — R531 Weakness: Secondary | ICD-10-CM

## 2022-10-05 DIAGNOSIS — E860 Dehydration: Secondary | ICD-10-CM | POA: Diagnosis not present

## 2022-10-05 DIAGNOSIS — Z90710 Acquired absence of both cervix and uterus: Secondary | ICD-10-CM

## 2022-10-05 DIAGNOSIS — Z79899 Other long term (current) drug therapy: Secondary | ICD-10-CM

## 2022-10-05 DIAGNOSIS — I629 Nontraumatic intracranial hemorrhage, unspecified: Secondary | ICD-10-CM

## 2022-10-05 DIAGNOSIS — N390 Urinary tract infection, site not specified: Secondary | ICD-10-CM | POA: Diagnosis not present

## 2022-10-05 DIAGNOSIS — Z743 Need for continuous supervision: Secondary | ICD-10-CM | POA: Diagnosis not present

## 2022-10-05 DIAGNOSIS — Z8249 Family history of ischemic heart disease and other diseases of the circulatory system: Secondary | ICD-10-CM

## 2022-10-05 DIAGNOSIS — Z888 Allergy status to other drugs, medicaments and biological substances status: Secondary | ICD-10-CM

## 2022-10-05 DIAGNOSIS — F419 Anxiety disorder, unspecified: Secondary | ICD-10-CM | POA: Diagnosis present

## 2022-10-05 DIAGNOSIS — R410 Disorientation, unspecified: Secondary | ICD-10-CM | POA: Diagnosis not present

## 2022-10-05 DIAGNOSIS — E119 Type 2 diabetes mellitus without complications: Secondary | ICD-10-CM | POA: Diagnosis not present

## 2022-10-05 DIAGNOSIS — W19XXXD Unspecified fall, subsequent encounter: Secondary | ICD-10-CM | POA: Diagnosis present

## 2022-10-05 DIAGNOSIS — Z7984 Long term (current) use of oral hypoglycemic drugs: Secondary | ICD-10-CM

## 2022-10-05 DIAGNOSIS — R262 Difficulty in walking, not elsewhere classified: Secondary | ICD-10-CM | POA: Diagnosis present

## 2022-10-05 DIAGNOSIS — Z91041 Radiographic dye allergy status: Secondary | ICD-10-CM

## 2022-10-05 DIAGNOSIS — Z85038 Personal history of other malignant neoplasm of large intestine: Secondary | ICD-10-CM | POA: Diagnosis not present

## 2022-10-05 DIAGNOSIS — Z7983 Long term (current) use of bisphosphonates: Secondary | ICD-10-CM

## 2022-10-05 LAB — COMPREHENSIVE METABOLIC PANEL
ALT: 27 U/L (ref 0–44)
AST: 29 U/L (ref 15–41)
Albumin: 3.1 g/dL — ABNORMAL LOW (ref 3.5–5.0)
Alkaline Phosphatase: 55 U/L (ref 38–126)
Anion gap: 14 (ref 5–15)
BUN: 20 mg/dL (ref 8–23)
CO2: 20 mmol/L — ABNORMAL LOW (ref 22–32)
Calcium: 8.9 mg/dL (ref 8.9–10.3)
Chloride: 98 mmol/L (ref 98–111)
Creatinine, Ser: 0.58 mg/dL (ref 0.44–1.00)
GFR, Estimated: >60 mL/min (ref >60)
Glucose, Bld: 176 mg/dL — ABNORMAL HIGH (ref 70–99)
Potassium: 3.4 mmol/L — ABNORMAL LOW (ref 3.5–5.1)
Sodium: 132 mmol/L — ABNORMAL LOW (ref 135–145)
Total Bilirubin: 2.1 mg/dL — ABNORMAL HIGH (ref 0.3–1.2)
Total Protein: 6.8 g/dL (ref 6.5–8.1)

## 2022-10-05 LAB — CBC WITH DIFFERENTIAL/PLATELET
Abs Immature Granulocytes: 0.03 10*3/uL (ref 0.00–0.07)
Basophils Absolute: 0.1 10*3/uL (ref 0.0–0.1)
Basophils Relative: 1 %
Eosinophils Absolute: 0 10*3/uL (ref 0.0–0.5)
Eosinophils Relative: 0 %
HCT: 30.2 % — ABNORMAL LOW (ref 36.0–46.0)
Hemoglobin: 10.1 g/dL — ABNORMAL LOW (ref 12.0–15.0)
Immature Granulocytes: 0 %
Lymphocytes Relative: 5 %
Lymphs Abs: 0.4 10*3/uL — ABNORMAL LOW (ref 0.7–4.0)
MCH: 28.7 pg (ref 26.0–34.0)
MCHC: 33.4 g/dL (ref 30.0–36.0)
MCV: 85.8 fL (ref 80.0–100.0)
Monocytes Absolute: 0.9 10*3/uL (ref 0.1–1.0)
Monocytes Relative: 11 %
Neutro Abs: 6.7 10*3/uL (ref 1.7–7.7)
Neutrophils Relative %: 83 %
Platelets: 100 10*3/uL — ABNORMAL LOW (ref 150–400)
RBC: 3.52 MIL/uL — ABNORMAL LOW (ref 3.87–5.11)
RDW: 14.3 % (ref 11.5–15.5)
WBC: 8.1 10*3/uL (ref 4.0–10.5)
nRBC: 0 % (ref 0.0–0.2)

## 2022-10-05 LAB — PROTIME-INR
INR: 1.4 — ABNORMAL HIGH (ref 0.8–1.2)
Prothrombin Time: 16.6 seconds — ABNORMAL HIGH (ref 11.4–15.2)

## 2022-10-05 LAB — URINALYSIS, ROUTINE W REFLEX MICROSCOPIC
Bilirubin Urine: NEGATIVE
Glucose, UA: NEGATIVE mg/dL
Ketones, ur: 5 mg/dL — AB
Nitrite: POSITIVE — AB
Protein, ur: 100 mg/dL — AB
Specific Gravity, Urine: 1.018 (ref 1.005–1.030)
WBC, UA: >50 WBC/hpf — ABNORMAL HIGH (ref 0–5)
pH: 5 (ref 5.0–8.0)

## 2022-10-05 NOTE — ED Provider Notes (Signed)
[facility] DEPT Provider Note   CSN: [id number] Arrival date & time: 10/05/22  2230     History  Chief Complaint  Patient presents with   Altered Mental Status    Amber Crosby is a 77 y.o. female.  Patient is a 77 year old female with past medical history of colon cancer treated with chemotherapy causing liver damage, hypertension, type 2 diabetes, hyperlipidemia, anemia, portal hypertension, esophageal varices, portal vein thrombosis.  Patient presenting today for evaluation of weakness and altered mental status.  According to the daughter at bedside, patient has been sluggish this afternoon.  She has had difficulty walking and does not seem to be completing her sentences.  The daughter states that this is how she presents with UTIs and has also had an elevated ammonia level with similar symptoms in the past.  She denies any fevers or chills.  She denies any headache, dysuria, or other symptoms.  The history is provided by the patient.       Home Medications Prior to Admission medications   Medication Sig Start Date End Date Taking? Authorizing Provider  alendronate (FOSAMAX) 70 MG tablet  03/11/22   [provider]  ALPRAZolam (XANAX) 0.25 MG tablet Take 1 tablet (0.25 mg total) by mouth at bedtime as needed for anxiety. 10/23/15   Darlin Coco, MD  amLODipine (NORVASC) 5 MG tablet Take 1 tablet (5 mg total) by mouth daily. NEED APPOINTMENT 08/16/22   Skeet Latch, MD  apixaban (ELIQUIS) 5 MG TABS tablet Take 1 tablet (5 mg total) by mouth 2 (two) times daily. 09/17/22   Dawley, Troy C, DO  benzonatate (TESSALON) 100 MG capsule Take 1 capsule (100 mg total) by mouth every 8 (eight) hours. 07/02/22   Redwine, Madison A, PA-C  Cholecalciferol (VITAMIN D PO) Take 1 tablet by mouth daily.     [provider]  loperamide (IMODIUM A-D) 2 MG tablet Take 1 tablet (2 mg total) by mouth 4 (four) times daily as needed for diarrhea or  loose stools. 01/03/21   Fredia Sorrow, MD  losartan-hydrochlorothiazide (HYZAAR) 100-12.5 MG tablet Take 1 tablet by mouth daily. Must keep appointment for further refills. 03/29/21   Loel Dubonnet, NP  metFORMIN (GLUCOPHAGE) 500 MG tablet Take 1,000 mg by mouth daily with breakfast.     [provider]  nadolol (CORGARD) 40 MG tablet Take 120 mg by mouth daily.     [provider]  ondansetron (ZOFRAN ODT) 4 MG disintegrating tablet Take 1 tablet (4 mg total) by mouth every 8 (eight) hours as needed for nausea or vomiting. 01/03/21   Fredia Sorrow, MD  ondansetron (ZOFRAN) 8 MG tablet Take 8 mg by mouth every 8 (eight) hours as needed for nausea or vomiting.    [provider]  rosuvastatin (CRESTOR) 5 MG tablet TAKE 1 TABLET (5 MG TOTAL) BY MOUTH DAILY. 06/22/21   Skeet Latch, MD      Allergies    Meperidine and related, Other, Contrast media [iodinated contrast media], Iohexol, and Morphine and related    Review of Systems   Review of Systems  All other systems reviewed and are negative.   Physical Exam Updated Vital Signs BP 137/65 (BP Location: Right Arm)   Pulse 83   Temp 99.3 F (37.4 C) (Oral)   Resp 14   Wt 68.8 kg   SpO2 95%   BMI 27.74 kg/m  Physical Exam Vitals and nursing note reviewed.  Constitutional:  General: She is not in acute distress.    Appearance: She is well-developed. She is not diaphoretic.  HENT:     Head: Normocephalic and atraumatic.  Eyes:     Extraocular Movements: Extraocular movements intact.     Pupils: Pupils are equal, round, and reactive to light.  Cardiovascular:     Rate and Rhythm: Normal rate and regular rhythm.     Heart sounds: No murmur heard.    No friction rub. No gallop.  Pulmonary:     Effort: Pulmonary effort is normal. No respiratory distress.     Breath sounds: Normal breath sounds. No wheezing.  Abdominal:     General: Bowel sounds are normal. There is no distension.      Palpations: Abdomen is soft.     Tenderness: There is no abdominal tenderness.  Musculoskeletal:        General: Normal range of motion.     Cervical back: Normal range of motion and neck supple.  Skin:    General: Skin is warm and dry.  Neurological:     General: No focal deficit present.     Mental Status: She is alert and oriented to person, place, and time. Mental status is at baseline.     Cranial Nerves: No cranial nerve deficit.     Motor: No weakness.     Coordination: Coordination normal.     ED Results / Procedures / Treatments   Labs (all labs ordered are listed, but only abnormal results are displayed) Labs Reviewed  CBC WITH DIFFERENTIAL/PLATELET  COMPREHENSIVE METABOLIC PANEL  PROTIME-INR  AMMONIA  URINALYSIS, ROUTINE W REFLEX MICROSCOPIC    EKG None  Radiology No results found.  Procedures Procedures    Medications Ordered in ED Medications - No data to display  ED Course/ Medical Decision Making/ A&P  Patient with past medical history as per HPI presenting with complaints of confusion, weakness in the setting of possible UTI.  She has presented like this in the past when she has had a urinary tract infection.  Patient arrives here afebrile and clinically well-appearing.  She is neurologically intact.  Physical examination otherwise unremarkable.  Workup initiated including CBC and metabolic panel.  She has a mildly elevated glucose of 267, blood studies otherwise unremarkable.  Urinalysis is consistent with UTI.  Patient has also had an intraparenchymal hemorrhage due to trauma in the past few weeks.  A CT scan was obtained after patient evaluated in triage.  This showed a hypoattenuation in the left frontal region consistent with prior CT scan, but unknown whether or not this finding is new or residual.  Radiology has recommended a repeat CT scan in 6 hours.  CT repeated in 6 hours and is unchanged.  At this point, disposition discussed with patient  and daughter.  She has received Rocephin here in the ER for her UTI and the plan is to discharge with Keflex.  Final Clinical Impression(s) / ED Diagnoses Final diagnoses:  None    Rx / DC Orders ED Discharge Orders     None         Veryl Speak, MD 10/07/22 [phone]

## 2022-10-05 NOTE — ED Triage Notes (Signed)
Pt arrives EMS from home with reports of altered mental status throughout the day. Pt has hx of UTIs and hx of liver failure with high ammonia levels. Pt reports feeling fatigued. Pt alert and oriented x2 only which is unusual for her. Pt has had difficulty walking as well. Family reports this happens sometimes with UTI or her ammonia levels being high. Per family pt was at her mental baseline at 8am but took a nap today and woke up altered.

## 2022-10-06 ENCOUNTER — Emergency Department (HOSPITAL_COMMUNITY): Payer: Medicare Other

## 2022-10-06 DIAGNOSIS — W19XXXD Unspecified fall, subsequent encounter: Secondary | ICD-10-CM | POA: Diagnosis present

## 2022-10-06 DIAGNOSIS — Z7982 Long term (current) use of aspirin: Secondary | ICD-10-CM | POA: Diagnosis not present

## 2022-10-06 DIAGNOSIS — Z87440 Personal history of urinary (tract) infections: Secondary | ICD-10-CM | POA: Diagnosis not present

## 2022-10-06 DIAGNOSIS — N390 Urinary tract infection, site not specified: Secondary | ICD-10-CM | POA: Diagnosis present

## 2022-10-06 DIAGNOSIS — Z809 Family history of malignant neoplasm, unspecified: Secondary | ICD-10-CM | POA: Diagnosis not present

## 2022-10-06 DIAGNOSIS — I10 Essential (primary) hypertension: Secondary | ICD-10-CM | POA: Diagnosis present

## 2022-10-06 DIAGNOSIS — R531 Weakness: Secondary | ICD-10-CM

## 2022-10-06 DIAGNOSIS — E876 Hypokalemia: Secondary | ICD-10-CM | POA: Diagnosis present

## 2022-10-06 DIAGNOSIS — I81 Portal vein thrombosis: Secondary | ICD-10-CM | POA: Diagnosis present

## 2022-10-06 DIAGNOSIS — E861 Hypovolemia: Secondary | ICD-10-CM | POA: Diagnosis present

## 2022-10-06 DIAGNOSIS — Z1152 Encounter for screening for COVID-19: Secondary | ICD-10-CM | POA: Diagnosis not present

## 2022-10-06 DIAGNOSIS — Z8249 Family history of ischemic heart disease and other diseases of the circulatory system: Secondary | ICD-10-CM | POA: Diagnosis not present

## 2022-10-06 DIAGNOSIS — K766 Portal hypertension: Secondary | ICD-10-CM | POA: Diagnosis present

## 2022-10-06 DIAGNOSIS — F419 Anxiety disorder, unspecified: Secondary | ICD-10-CM | POA: Diagnosis present

## 2022-10-06 DIAGNOSIS — E119 Type 2 diabetes mellitus without complications: Secondary | ICD-10-CM | POA: Diagnosis present

## 2022-10-06 DIAGNOSIS — G9341 Metabolic encephalopathy: Secondary | ICD-10-CM | POA: Diagnosis present

## 2022-10-06 DIAGNOSIS — S066XAD Traumatic subarachnoid hemorrhage with loss of consciousness status unknown, subsequent encounter: Secondary | ICD-10-CM | POA: Diagnosis not present

## 2022-10-06 DIAGNOSIS — K7460 Unspecified cirrhosis of liver: Secondary | ICD-10-CM | POA: Diagnosis present

## 2022-10-06 DIAGNOSIS — D649 Anemia, unspecified: Secondary | ICD-10-CM | POA: Diagnosis present

## 2022-10-06 DIAGNOSIS — E860 Dehydration: Secondary | ICD-10-CM | POA: Diagnosis present

## 2022-10-06 DIAGNOSIS — E7800 Pure hypercholesterolemia, unspecified: Secondary | ICD-10-CM | POA: Diagnosis present

## 2022-10-06 DIAGNOSIS — Z833 Family history of diabetes mellitus: Secondary | ICD-10-CM | POA: Diagnosis not present

## 2022-10-06 DIAGNOSIS — K7682 Hepatic encephalopathy: Secondary | ICD-10-CM | POA: Diagnosis present

## 2022-10-06 DIAGNOSIS — Z85038 Personal history of other malignant neoplasm of large intestine: Secondary | ICD-10-CM | POA: Diagnosis not present

## 2022-10-06 DIAGNOSIS — D696 Thrombocytopenia, unspecified: Secondary | ICD-10-CM | POA: Insufficient documentation

## 2022-10-06 DIAGNOSIS — E871 Hypo-osmolality and hyponatremia: Secondary | ICD-10-CM | POA: Diagnosis present

## 2022-10-06 LAB — COMPREHENSIVE METABOLIC PANEL
ALT: 26 U/L (ref 0–44)
AST: 31 U/L (ref 15–41)
Albumin: 3.2 g/dL — ABNORMAL LOW (ref 3.5–5.0)
Alkaline Phosphatase: 53 U/L (ref 38–126)
Anion gap: 11 (ref 5–15)
BUN: 18 mg/dL (ref 8–23)
CO2: 21 mmol/L — ABNORMAL LOW (ref 22–32)
Calcium: 8.4 mg/dL — ABNORMAL LOW (ref 8.9–10.3)
Chloride: 100 mmol/L (ref 98–111)
Creatinine, Ser: 0.49 mg/dL (ref 0.44–1.00)
GFR, Estimated: >60 mL/min (ref >60)
Glucose, Bld: 267 mg/dL — ABNORMAL HIGH (ref 70–99)
Potassium: 3.4 mmol/L — ABNORMAL LOW (ref 3.5–5.1)
Sodium: 132 mmol/L — ABNORMAL LOW (ref 135–145)
Total Bilirubin: 2 mg/dL — ABNORMAL HIGH (ref 0.3–1.2)
Total Protein: 6.5 g/dL (ref 6.5–8.1)

## 2022-10-06 LAB — RESP PANEL BY RT-PCR (RSV, FLU A&B, COVID)  RVPGX2
Influenza A by PCR: NEGATIVE
Influenza B by PCR: NEGATIVE
Resp Syncytial Virus by PCR: NEGATIVE
SARS Coronavirus 2 by RT PCR: NEGATIVE

## 2022-10-06 LAB — CBC WITH DIFFERENTIAL/PLATELET
Abs Immature Granulocytes: 0.03 10*3/uL (ref 0.00–0.07)
Basophils Absolute: 0 10*3/uL (ref 0.0–0.1)
Basophils Relative: 0 %
Eosinophils Absolute: 0 10*3/uL (ref 0.0–0.5)
Eosinophils Relative: 0 %
HCT: 28.5 % — ABNORMAL LOW (ref 36.0–46.0)
Hemoglobin: 9.5 g/dL — ABNORMAL LOW (ref 12.0–15.0)
Immature Granulocytes: 0 %
Lymphocytes Relative: 6 %
Lymphs Abs: 0.5 10*3/uL — ABNORMAL LOW (ref 0.7–4.0)
MCH: 28.4 pg (ref 26.0–34.0)
MCHC: 33.3 g/dL (ref 30.0–36.0)
MCV: 85.3 fL (ref 80.0–100.0)
Monocytes Absolute: 0.9 10*3/uL (ref 0.1–1.0)
Monocytes Relative: 11 %
Neutro Abs: 6.8 10*3/uL (ref 1.7–7.7)
Neutrophils Relative %: 83 %
Platelets: 78 10*3/uL — ABNORMAL LOW (ref 150–400)
RBC: 3.34 MIL/uL — ABNORMAL LOW (ref 3.87–5.11)
RDW: 14.5 % (ref 11.5–15.5)
WBC: 8.3 10*3/uL (ref 4.0–10.5)
nRBC: 0 % (ref 0.0–0.2)

## 2022-10-06 LAB — AMMONIA: Ammonia: 51 umol/L — ABNORMAL HIGH (ref 9–35)

## 2022-10-06 LAB — MAGNESIUM: Magnesium: 1.8 mg/dL (ref 1.7–2.4)

## 2022-10-06 LAB — HEMOGLOBIN A1C
Hgb A1c MFr Bld: 6.9 % — ABNORMAL HIGH (ref 4.8–5.6)
Mean Plasma Glucose: 151.33 mg/dL

## 2022-10-06 LAB — CBG MONITORING, ED
Glucose-Capillary: 255 mg/dL — ABNORMAL HIGH (ref 70–99)
Glucose-Capillary: 245 mg/dL — ABNORMAL HIGH (ref 70–99)

## 2022-10-06 LAB — GLUCOSE, CAPILLARY: Glucose-Capillary: 171 mg/dL — ABNORMAL HIGH (ref 70–99)

## 2022-10-06 MED ORDER — LOSARTAN POTASSIUM 50 MG PO TABS
100.0000 mg | ORAL_TABLET | Freq: Every day | ORAL | Status: DC
  Administered 2022-10-06 – 2022-10-11 (×6): 100 mg via ORAL
  Filled 2022-10-06 (×6): qty 2

## 2022-10-06 MED ORDER — SODIUM CHLORIDE 0.9 % IV SOLN
1.0000 g | INTRAVENOUS | Status: DC
  Administered 2022-10-06 – 2022-10-11 (×5): 1 g via INTRAVENOUS
  Filled 2022-10-06 (×5): qty 10

## 2022-10-06 MED ORDER — AMLODIPINE BESYLATE 5 MG PO TABS
5.0000 mg | ORAL_TABLET | Freq: Every day | ORAL | Status: DC
  Administered 2022-10-06 – 2022-10-11 (×6): 5 mg via ORAL
  Filled 2022-10-06 (×6): qty 1

## 2022-10-06 MED ORDER — SODIUM CHLORIDE 0.9 % IV SOLN
1.0000 g | Freq: Once | INTRAVENOUS | Status: AC
  Administered 2022-10-06: 1 g via INTRAVENOUS
  Filled 2022-10-06: qty 10

## 2022-10-06 MED ORDER — HYDROCHLOROTHIAZIDE 12.5 MG PO TABS
12.5000 mg | ORAL_TABLET | Freq: Every day | ORAL | Status: DC
  Administered 2022-10-06 – 2022-10-07 (×2): 12.5 mg via ORAL
  Filled 2022-10-06 (×2): qty 1

## 2022-10-06 MED ORDER — PANTOPRAZOLE SODIUM 40 MG PO TBEC
40.0000 mg | DELAYED_RELEASE_TABLET | Freq: Every day | ORAL | Status: DC
  Administered 2022-10-07 – 2022-10-11 (×5): 40 mg via ORAL
  Filled 2022-10-06 (×6): qty 1

## 2022-10-06 MED ORDER — ACETAMINOPHEN 325 MG PO TABS
650.0000 mg | ORAL_TABLET | Freq: Four times a day (QID) | ORAL | Status: DC | PRN
  Filled 2022-10-06 (×2): qty 2

## 2022-10-06 MED ORDER — POTASSIUM CHLORIDE CRYS ER 20 MEQ PO TBCR
40.0000 meq | EXTENDED_RELEASE_TABLET | Freq: Every day | ORAL | Status: DC
  Administered 2022-10-06 – 2022-10-11 (×6): 40 meq via ORAL
  Filled 2022-10-06 (×6): qty 2

## 2022-10-06 MED ORDER — ALPRAZOLAM 0.25 MG PO TABS: 0.2500 mg | ORAL_TABLET | Freq: Every evening | ORAL | Status: DC | PRN

## 2022-10-06 MED ORDER — INSULIN ASPART 100 UNIT/ML IJ SOLN
0.0000 [IU] | Freq: Three times a day (TID) | INTRAMUSCULAR | Status: DC
  Administered 2022-10-06: 3 [IU] via SUBCUTANEOUS
  Administered 2022-10-06: 5 [IU] via SUBCUTANEOUS
  Administered 2022-10-07: 2 [IU] via SUBCUTANEOUS
  Administered 2022-10-07: 3 [IU] via SUBCUTANEOUS
  Administered 2022-10-07 – 2022-10-09 (×4): 2 [IU] via SUBCUTANEOUS
  Administered 2022-10-09: 5 [IU] via SUBCUTANEOUS
  Administered 2022-10-10: 2 [IU] via SUBCUTANEOUS
  Administered 2022-10-10: 1 [IU] via SUBCUTANEOUS
  Administered 2022-10-10: 3 [IU] via SUBCUTANEOUS
  Administered 2022-10-11: 2 [IU] via SUBCUTANEOUS
  Filled 2022-10-06: qty 0.09

## 2022-10-06 MED ORDER — ACETAMINOPHEN 650 MG RE SUPP: 650.0000 mg | Freq: Four times a day (QID) | RECTAL | Status: DC | PRN

## 2022-10-06 MED ORDER — LOSARTAN POTASSIUM-HCTZ 100-12.5 MG PO TABS: 1.0000 | ORAL_TABLET | Freq: Every day | ORAL | Status: DC

## 2022-10-06 MED ORDER — CEPHALEXIN 500 MG PO CAPS: 500.0000 mg | ORAL_CAPSULE | Freq: Three times a day (TID) | ORAL | 0 refills | Status: DC

## 2022-10-06 MED ORDER — ASPIRIN 81 MG PO TBEC
81.0000 mg | DELAYED_RELEASE_TABLET | Freq: Every day | ORAL | Status: DC
  Administered 2022-10-06 – 2022-10-11 (×6): 81 mg via ORAL
  Filled 2022-10-06 (×6): qty 1

## 2022-10-06 MED ORDER — SODIUM CHLORIDE 0.9 % IV BOLUS
500.0000 mL | Freq: Once | INTRAVENOUS | Status: AC
  Administered 2022-10-06: 500 mL via INTRAVENOUS

## 2022-10-06 MED ORDER — ROSUVASTATIN CALCIUM 10 MG PO TABS
10.0000 mg | ORAL_TABLET | Freq: Every day | ORAL | Status: DC
  Administered 2022-10-06 – 2022-10-11 (×6): 10 mg via ORAL
  Filled 2022-10-06 (×6): qty 1

## 2022-10-06 MED ORDER — NADOLOL 40 MG PO TABS
120.0000 mg | ORAL_TABLET | Freq: Every day | ORAL | Status: DC
  Administered 2022-10-06 – 2022-10-11 (×6): 120 mg via ORAL
  Filled 2022-10-06 (×6): qty 3
  Filled 2022-10-06: qty 6

## 2022-10-06 NOTE — H&P (Signed)
History and Physical    Patient: Amber Crosby:[id number] DOB: December 24, 1945 DOA: 10/05/2022 DOS: the patient was seen and examined on 10/06/2022 PCP: Pcp, No  Patient coming from: Home  Chief Complaint:  Chief Complaint  Patient presents with   Altered Mental Status   HPI: Amber Crosby is a 77 y.o. female with medical history significant of DM2, HTN, HLD, cirrhosis, portal vein thrombosis, anxiety, SAH. Presenting with altered mental status. History is from daughter at bedside. The patient was in her normal state of health until yesterday morning. Her daughter noticed she said a couple of odd things here and there in the morning, but didn't think much of it. However, as the day progressed the patient became more confused and then had difficulty following commands. She normally walks on her own, but she was having difficulty with that and required a wheelchair to move around the house. This is when family thought she may have a UTI as her previous UTIs have brought about declines like this. So they brought her to the ED for evaluation. She denies any other aggravating or alleviating factors.   Review of Systems: As mentioned in the history of present illness. All other systems reviewed and are negative. Past Medical History:  Diagnosis Date   Anxiety    Colon cancer [facility])    Without evidence of recurrence   Diabetes mellitus    Type 2   Ear infection    Essential hypertension    Hemorrhoids    Internal-external small hemorrhoids   Hypercholesterolemia    Portal vein thrombosis 01/29/2020   Past Surgical History:  Procedure Laterality Date   ESOPHAGOGASTRODUODENOSCOPY   08/04/2010   VAGINAL HYSTERECTOMY  1979   Social History:  reports that she has never smoked. She has never used smokeless tobacco. She reports that she does not drink alcohol and does not use drugs.  Allergies  Allergen Reactions   Meperidine And Related Nausea And Vomiting   Other Rash and Hives   Contrast  Media [Iodinated Contrast Media]     Pt can not have IV dye under any circumstances   Iohexol     Pt had reaction (hives) even with pre-meds, so patient can not have IV dye for any CT    Morphine And Related     Family History  Problem Relation Age of Onset   Hypertension Mother    Cancer Mother    Diabetes Mother     Prior to Admission medications   Medication Sig Start Date End Date Taking? Authorizing Provider  cephALEXin (KEFLEX) 500 MG capsule Take 1 capsule (500 mg total) by mouth 3 (three) times daily. 10/06/22  Yes Veryl Speak, MD  alendronate (FOSAMAX) 70 MG tablet  03/11/22   [provider]  ALPRAZolam Duanne Moron) 0.25 MG tablet Take 1 tablet (0.25 mg total) by mouth at bedtime as needed for anxiety. 10/23/15   Darlin Coco, MD  amLODipine (NORVASC) 5 MG tablet Take 1 tablet (5 mg total) by mouth daily. NEED APPOINTMENT 08/16/22   Skeet Latch, MD  apixaban (ELIQUIS) 5 MG TABS tablet Take 1 tablet (5 mg total) by mouth 2 (two) times daily. 09/17/22   Dawley, Troy C, DO  benzonatate (TESSALON) 100 MG capsule Take 1 capsule (100 mg total) by mouth every 8 (eight) hours. 07/02/22   Redwine, Madison A, PA-C  Cholecalciferol (VITAMIN D PO) Take 1 tablet by mouth daily.     [provider]  loperamide (IMODIUM A-D) 2 MG tablet  Take 1 tablet (2 mg total) by mouth 4 (four) times daily as needed for diarrhea or loose stools. 01/03/21   Fredia Sorrow, MD  losartan-hydrochlorothiazide (HYZAAR) 100-12.5 MG tablet Take 1 tablet by mouth daily. Must keep appointment for further refills. 03/29/21   Loel Dubonnet, NP  metFORMIN (GLUCOPHAGE) 500 MG tablet Take 1,000 mg by mouth daily with breakfast.     [provider]  nadolol (CORGARD) 40 MG tablet Take 120 mg by mouth daily.     [provider]  ondansetron (ZOFRAN ODT) 4 MG disintegrating tablet Take 1 tablet (4 mg total) by mouth every 8 (eight) hours as needed for nausea or vomiting. 01/03/21    Fredia Sorrow, MD  ondansetron (ZOFRAN) 8 MG tablet Take 8 mg by mouth every 8 (eight) hours as needed for nausea or vomiting.    [provider]  rosuvastatin (CRESTOR) 5 MG tablet TAKE 1 TABLET (5 MG TOTAL) BY MOUTH DAILY. 06/22/21   Skeet Latch, MD    Physical Exam: Vitals:   10/06/22 0745 10/06/22 0805 10/06/22 0812 10/06/22 0850  BP:  (!) 134/59  (!) 133/51  Pulse: 78 76  78  Resp: 20 (!) 23  (!) 26  Temp:   99.3 F (37.4 C)   TempSrc:   Oral   SpO2: 98% 96%  96%  Weight:       General: 77 y.o. female resting in bed in NAD Eyes: PERRL, normal sclera ENMT: Nares patent w/o discharge, orophaynx clear, dentition normal, ears w/o discharge/lesions/ulcers Neck: Supple, trachea midline Cardiovascular: RRR, +S1, S2, no m/g/r, equal pulses throughout Respiratory: CTABL, no w/r/r, normal WOB GI: BS+, NDNT, no masses noted, no organomegaly noted MSK: No e/c/c Skin: No rashes, bruises, ulcerations noted Neuro: A&O x 2 (name, place), no focal deficits Psyc: Appropriate interaction and affect; confused, but calm/cooperative  Data Reviewed:  Results for orders placed or performed during the hospital encounter of 10/05/22 (from the past 24 hour(s))  Urinalysis, Routine w reflex microscopic Urine, Clean Catch     Status: Abnormal   Collection Time: 10/05/22 11:16 PM  Result Value Ref Range   Color, Urine YELLOW YELLOW   APPearance CLOUDY (A) CLEAR   Specific Gravity, Urine 1.018 1.005 - 1.030   pH 5.0 5.0 - 8.0   Glucose, UA NEGATIVE NEGATIVE mg/dL   Hgb urine dipstick MODERATE (A) NEGATIVE   Bilirubin Urine NEGATIVE NEGATIVE   Ketones, ur 5 (A) NEGATIVE mg/dL   Protein, ur 100 (A) NEGATIVE mg/dL   Nitrite POSITIVE (A) NEGATIVE   Leukocytes,Ua LARGE (A) NEGATIVE   RBC / HPF 11-20 0 - 5 RBC/hpf   WBC, UA >50 (H) 0 - 5 WBC/hpf   Bacteria, UA MANY (A) NONE SEEN   Squamous Epithelial / HPF 0-5 0 - 5 /HPF   Non Squamous Epithelial 0-5 (A) NONE SEEN  CBC with  Differential     Status: Abnormal   Collection Time: 10/05/22 11:26 PM  Result Value Ref Range   WBC 8.1 4.0 - 10.5 K/uL   RBC 3.52 (L) 3.87 - 5.11 MIL/uL   Hemoglobin 10.1 (L) 12.0 - 15.0 g/dL   HCT 30.2 (L) 36.0 - 46.0 %   MCV 85.8 80.0 - 100.0 fL   MCH 28.7 26.0 - 34.0 pg   MCHC 33.4 30.0 - 36.0 g/dL   RDW 14.3 11.5 - 15.5 %   Platelets 100 (L) 150 - 400 K/uL   nRBC 0.0 0.0 - 0.2 %   Neutrophils  Relative % 83 %   Neutro Abs 6.7 1.7 - 7.7 K/uL   Lymphocytes Relative 5 %   Lymphs Abs 0.4 (L) 0.7 - 4.0 K/uL   Monocytes Relative 11 %   Monocytes Absolute 0.9 0.1 - 1.0 K/uL   Eosinophils Relative 0 %   Eosinophils Absolute 0.0 0.0 - 0.5 K/uL   Basophils Relative 1 %   Basophils Absolute 0.1 0.0 - 0.1 K/uL   Immature Granulocytes 0 %   Abs Immature Granulocytes 0.03 0.00 - 0.07 K/uL  Comprehensive metabolic panel     Status: Abnormal   Collection Time: 10/05/22 11:26 PM  Result Value Ref Range   Sodium 132 (L) 135 - 145 mmol/L   Potassium 3.4 (L) 3.5 - 5.1 mmol/L   Chloride 98 98 - 111 mmol/L   CO2 20 (L) 22 - 32 mmol/L   Glucose, Bld 176 (H) 70 - 99 mg/dL   BUN 20 8 - 23 mg/dL   Creatinine, Ser 0.58 0.44 - 1.00 mg/dL   Calcium 8.9 8.9 - 10.3 mg/dL   Total Protein 6.8 6.5 - 8.1 g/dL   Albumin 3.1 (L) 3.5 - 5.0 g/dL   AST 29 15 - 41 U/L   ALT 27 0 - 44 U/L   Alkaline Phosphatase 55 38 - 126 U/L   Total Bilirubin 2.1 (H) 0.3 - 1.2 mg/dL   GFR, Estimated >60 >60 mL/min   Anion gap 14 5 - 15  Protime-INR     Status: Abnormal   Collection Time: 10/05/22 11:26 PM  Result Value Ref Range   Prothrombin Time 16.6 (H) 11.4 - 15.2 seconds   INR 1.4 (H) 0.8 - 1.2  Ammonia     Status: Abnormal   Collection Time: 10/05/22 11:26 PM  Result Value Ref Range   Ammonia 51 (H) 9 - 35 umol/L   CTH: Trace curvilinear hyperdensity along the left frontal lobe sulci. Finding could represent an acute subarachnoid hemorrhage versus residual of prior bleed on 09/07/2022. Recommend repeat CT  in 6 hours to evaluate for evolution/stability.  Repeat CTH: 1. Stable hyperattenuation in sulci of the high left frontal region comparing to exam from earlier today but decreased comparing to 09/07/2022. No evidence for interval further bleeding since the study from earlier today. Findings are likely residual from the blood products identified on the 09/07/2022 exam although, technically, trace interval rebleeding between the 09/17/2022 exam and the CT scan from earlier today cannot be completely excluded. 2. Atrophy with chronic small vessel ischemic disease. 3. Chronic paranasal sinus disease  Assessment and Plan: UTI     - place in obs, med-surg     - continue rocephin     - follow UCx  Acute metabolic encephalopathy     - likely secondary to above     - CTH w/ improved residual from previous SAH  Generalized weakness     - likely from above     - treat infection     - will have PT/OT eval  Thrombocytopenia     - likely secondary to liver dz     - SCDs for DVT  PPx  DM2     - SSI, glucose checks, DM diet, A1c  HTN     - continue home regimen when confirmed  HLD     - continue home regimen when confirmed  Hypokalemia     - replace K+; check Mg2+  Normocytic anemia     - no evidence of bleed, follow  Anxiety     - continue home regimen when confirmed  SAH     - imaging shows resolving SAH compared to one month ago     - her eliquis was stopped during her last visit; she remains on ASA, will continue for now  Cirrhosis Portal vein thrombosis Portal HTN Hyperbilirubinemia     - continue home regimen when confirmed     - bilirubin is chronically elevated and stable  Advance Care Planning:   Code Status: FULL, confirmed w/ daughter by phone and at bedside  Consults: None  Family Communication: w/ dtrs by phone and bedside  Severity of Illness: The appropriate patient status for this patient is OBSERVATION. Observation status is judged to be reasonable  and necessary in order to provide the required intensity of service to ensure the patient's safety. The patient's presenting symptoms, physical exam findings, and initial radiographic and laboratory data in the context of their medical condition is felt to place them at decreased risk for further clinical deterioration. Furthermore, it is anticipated that the patient will be medically stable for discharge from the hospital within 2 midnights of admission.   Author: Jonnie Finner, DO 10/06/2022 9:11 AM  For on call review [email or web address].

## 2022-10-06 NOTE — ED Notes (Signed)
Per Pt and Pt's daughter, Pt had no difficulty ambulating to and from the bedside commode and has been able to readjust herself in the beds w/o assistance.

## 2022-10-06 NOTE — Discharge Instructions (Addendum)
Amber Crosby,  You were in the hospital with a urinary tract infection causing confusion. This resolved with antibiotics. During your hospitalization, your sodium was low. This appears to have been related to dehydration and possibly worsened by hydrochlorothiazide. Thankfully, this improved with fluids and the nephrologist has recommended outpatient follow-up. Please follow-up with your PCP for repeat lab work in 2-3 days to recheck your sodium levels. Please stop taking hydrochlorothiazide. Please continue antibiotics as prescribed.

## 2022-10-06 NOTE — ED Notes (Signed)
Patient transported to CT 

## 2022-10-06 NOTE — ED Provider Notes (Signed)
I assumed care of the patient at 0 700.  Patient was up for discharge from the prior provider.  I did receive signout from him about the patient, had them brought in for altered mental status.  CT scan of the head with a subdural hematoma.  Unknown age.  Was repeated with stability and the patient was placed up for discharge.  She has had some waxing and waning of her mental status here.  Nursing staff had brought to my attention that she was again confused and was too weak to stand.  She does have a urinary tract infection.  I discussed this with family who tell me that off and on for the past couple days has been confused off and on.  Most consistent with delirium with a urinary tract infection.  Will discuss with medicine for admission.  The patients results and plan were reviewed and discussed.   Any x-rays performed were independently reviewed by myself.   Differential diagnosis were considered with the presenting HPI.  Medications  cefTRIAXone (ROCEPHIN) 1 g in sodium chloride 0.9 % 100 mL IVPB (0 g Intravenous Stopped 10/06/22 0158)  sodium chloride 0.9 % bolus 500 mL (0 mLs Intravenous Stopped 10/06/22 0357)    Vitals:   10/06/22 0700 10/06/22 0745 10/06/22 0805 10/06/22 0812  BP: (!) 136/54  (!) 134/59   Pulse: 79 78 76   Resp: 20 20 (!) 23   Temp:    99.3 F (37.4 C)  TempSrc:    Oral  SpO2: 93% 98% 96%   Weight:        Final diagnoses:  Urinary tract infection without hematuria, site unspecified  Abnormal head CT    Admission/ observation were discussed with the admitting physician, patient and/or family and they are comfortable with the plan.     Deno Etienne, DO 10/06/22 [phone]

## 2022-10-07 DIAGNOSIS — I10 Essential (primary) hypertension: Secondary | ICD-10-CM

## 2022-10-07 DIAGNOSIS — I81 Portal vein thrombosis: Secondary | ICD-10-CM

## 2022-10-07 DIAGNOSIS — K766 Portal hypertension: Secondary | ICD-10-CM | POA: Diagnosis not present

## 2022-10-07 DIAGNOSIS — G9341 Metabolic encephalopathy: Secondary | ICD-10-CM

## 2022-10-07 DIAGNOSIS — I8500 Esophageal varices without bleeding: Secondary | ICD-10-CM

## 2022-10-07 DIAGNOSIS — D696 Thrombocytopenia, unspecified: Secondary | ICD-10-CM

## 2022-10-07 DIAGNOSIS — N390 Urinary tract infection, site not specified: Secondary | ICD-10-CM | POA: Diagnosis not present

## 2022-10-07 DIAGNOSIS — R509 Fever, unspecified: Secondary | ICD-10-CM

## 2022-10-07 LAB — CBC
HCT: 30.7 % — ABNORMAL LOW (ref 36.0–46.0)
Hemoglobin: 10 g/dL — ABNORMAL LOW (ref 12.0–15.0)
MCH: 28.3 pg (ref 26.0–34.0)
MCHC: 32.6 g/dL (ref 30.0–36.0)
MCV: 87 fL (ref 80.0–100.0)
Platelets: 102 10*3/uL — ABNORMAL LOW (ref 150–400)
RBC: 3.53 MIL/uL — ABNORMAL LOW (ref 3.87–5.11)
RDW: 14.9 % (ref 11.5–15.5)
WBC: 7.5 10*3/uL (ref 4.0–10.5)
nRBC: 0 % (ref 0.0–0.2)

## 2022-10-07 LAB — GLUCOSE, CAPILLARY
Glucose-Capillary: 153 mg/dL — ABNORMAL HIGH (ref 70–99)
Glucose-Capillary: 207 mg/dL — ABNORMAL HIGH (ref 70–99)
Glucose-Capillary: 159 mg/dL — ABNORMAL HIGH (ref 70–99)
Glucose-Capillary: 151 mg/dL — ABNORMAL HIGH (ref 70–99)

## 2022-10-07 LAB — PROCALCITONIN: Procalcitonin: 0.26 ng/mL

## 2022-10-07 MED ORDER — LACTULOSE 10 GM/15ML PO SOLN
10.0000 g | Freq: Every day | ORAL | Status: DC
  Administered 2022-10-07: 10 g via ORAL
  Filled 2022-10-07: qty 15

## 2022-10-07 NOTE — Progress Notes (Signed)
PROGRESS NOTE    Amber Crosby  TIW:[id number] DOB: 10-04-1945 DOA: 10/05/2022 PCP: Pcp, No   Brief Narrative: Amber Crosby is a 77 y.o. female with a history of diabetes mellitus type 2, hypertension, hyperlipidemia, cirrhosis, portal vein thrombosis, anxiety, SAH. Patient presented secondary to altered mental status with evidence of urinary tract infection. Empiric antibiotics started with improvement of mental status, without achieving complete baseline.   Assessment and Plan:  UTI Urinalysis suggests infection. No urine culture obtained on admission. Empiric Ceftriaxone started. -Obtain urine culture today -Continue Ceftriaxone  Fever New. Possibly related to UTI, which would make it more complicated. Urine culture pending. -Blood cultures, procalcitonin  Acute metabolic/hepatic encephalopathy Likely multifactorial secondary to hepatic encephalopathy and UTI. Mental status improved from admission but still not at baseline. Patient not restarted on home lactulose. -Restart lactulose 10 gm daily  Generalized weakness Patient evaluated by PT with recommendations for no PT follow-up.  Thrombocytopenia Chronic. Stable.  Diabetes mellitus, type 2 -Continue SSI  Primary hypertension -Continue amlodipine, hydrochlorothiazide and losartan  Hyperlipidemia -Continue Crestor  Hypokalemia Mild. Potassium supplementation given.  Normocytic anemia Stable.  Anxiety Noted.  Subarachnoid hemorrhage Secondary to history of fall. Chronic issue.  Cirrhosis Portal vein thrombosis Portal hypertension Patient follows with [facility] hepatology. Chronic hyperbilirubinemia. -Continue nadolol  DVT prophylaxis: SCDs Code Status:   Code Status: Full Code Family Communication: Daughter at bedside Disposition Plan: Discharge possible in 1 day if fever improved and workup negative   Consultants:  None  Procedures:  None  Antimicrobials: Ceftriaxone     Subjective: Patient reports no concerns this morning. Daughter states patient is near baseline  Objective: BP 137/64 (BP Location: Left Arm)   Pulse 70   Temp 99.2 F (37.3 C) (Oral)   Resp 18   Wt 68.8 kg   SpO2 96%   BMI 27.74 kg/m   Examination:  General exam: Appears calm and comfortable Respiratory system: Clear to auscultation. Respiratory effort normal. Cardiovascular system: S1 & S2 heard, RRR. Gastrointestinal system: Abdomen is nondistended, soft and nontender. Normal bowel sounds heard. Central nervous system: Alert and oriented. No focal neurological deficits. Musculoskeletal: No calf tenderness   Data Reviewed: I have personally reviewed following labs and imaging studies  CBC Lab Results  Component Value Date   WBC 8.3 10/06/2022   RBC 3.34 (L) 10/06/2022   HGB 9.5 (L) 10/06/2022   HCT 28.5 (L) 10/06/2022   MCV 85.3 10/06/2022   MCH 28.4 10/06/2022   PLT 78 (L) 10/06/2022   MCHC 33.3 10/06/2022   RDW 14.5 10/06/2022   LYMPHSABS 0.5 (L) 10/06/2022   MONOABS 0.9 10/06/2022   EOSABS 0.0 10/06/2022   BASOSABS 0.0 25/01/3975     Last metabolic panel Lab Results  Component Value Date   NA 132 (L) 10/06/2022   K 3.4 (L) 10/06/2022   CL 100 10/06/2022   CO2 21 (L) 10/06/2022   BUN 18 10/06/2022   CREATININE 0.49 10/06/2022   GLUCOSE 267 (H) 10/06/2022   GFRNONAA >60 10/06/2022   GFRAA >60 08/29/2018   CALCIUM 8.4 (L) 10/06/2022   PROT 6.5 10/06/2022   ALBUMIN 3.2 (L) 10/06/2022   LABGLOB 2.2 03/03/2018   AGRATIO 2.0 03/03/2018   BILITOT 2.0 (H) 10/06/2022   ALKPHOS 53 10/06/2022   AST 31 10/06/2022   ALT 26 10/06/2022   ANIONGAP 11 10/06/2022    GFR: Estimated Creatinine Clearance: 54.4 mL/min (by C-G formula based on SCr of 0.49 mg/dL).  Recent  Results (from the past 240 hour(s))  Resp panel by RT-PCR (RSV, Flu A&B, Covid) Anterior Nasal Swab     Status: None   Collection Time: 10/06/22  8:35 AM   Specimen: Anterior Nasal Swab   Result Value Ref Range Status   SARS Coronavirus 2 by RT PCR NEGATIVE NEGATIVE Final    Comment: (NOTE) SARS-CoV-2 target nucleic acids are NOT DETECTED.  The SARS-CoV-2 RNA is generally detectable in upper respiratory specimens during the acute phase of infection. The lowest concentration of SARS-CoV-2 viral copies this assay can detect is 138 copies/mL. A negative result does not preclude SARS-Cov-2 infection and should not be used as the sole basis for treatment or other patient management decisions. A negative result may occur with  improper specimen collection/handling, submission of specimen other than nasopharyngeal swab, presence of viral mutation(s) within the areas targeted by this assay, and inadequate number of viral copies(<138 copies/mL). A negative result must be combined with clinical observations, patient history, and epidemiological information. The expected result is Negative.  Fact Sheet for Patients:  EntrepreneurPulse.com.au  Fact Sheet for Healthcare Providers:  IncredibleEmployment.be  This test is no t yet approved or cleared by the [place] FDA and  has been authorized for detection and/or diagnosis of SARS-CoV-2 by FDA under an Emergency Use Authorization (EUA). This EUA will remain  in effect (meaning this test can be used) for the duration of the COVID-19 declaration under Section 564(b)(1) of the Act, 21 U.S.C.section 360bbb-3(b)(1), unless the authorization is terminated  or revoked sooner.       Influenza A by PCR NEGATIVE NEGATIVE Final   Influenza B by PCR NEGATIVE NEGATIVE Final    Comment: (NOTE) The Xpert Xpress SARS-CoV-2/FLU/RSV plus assay is intended as an aid in the diagnosis of influenza from Nasopharyngeal swab specimens and should not be used as a sole basis for treatment. Nasal washings and aspirates are unacceptable for Xpert Xpress SARS-CoV-2/FLU/RSV testing.  Fact Sheet for  Patients: EntrepreneurPulse.com.au  Fact Sheet for Healthcare Providers: IncredibleEmployment.be  This test is not yet approved or cleared by the [place] FDA and has been authorized for detection and/or diagnosis of SARS-CoV-2 by FDA under an Emergency Use Authorization (EUA). This EUA will remain in effect (meaning this test can be used) for the duration of the COVID-19 declaration under Section 564(b)(1) of the Act, 21 U.S.C. section 360bbb-3(b)(1), unless the authorization is terminated or revoked.     Resp Syncytial Virus by PCR NEGATIVE NEGATIVE Final    Comment: (NOTE) Fact Sheet for Patients: EntrepreneurPulse.com.au  Fact Sheet for Healthcare Providers: IncredibleEmployment.be  This test is not yet approved or cleared by the [place] FDA and has been authorized for detection and/or diagnosis of SARS-CoV-2 by FDA under an Emergency Use Authorization (EUA). This EUA will remain in effect (meaning this test can be used) for the duration of the COVID-19 declaration under Section 564(b)(1) of the Act, 21 U.S.C. section 360bbb-3(b)(1), unless the authorization is terminated or revoked.  Performed at [facility], [place] [address] [place], [place] [address]       Radiology Studies: CT Head Wo Contrast  Result Date: 10/06/2022 CLINICAL DATA:  Hemorrhage identified on CT earlier today. EXAM: CT HEAD WITHOUT CONTRAST TECHNIQUE: Contiguous axial images were obtained from the base of the skull through the vertex without intravenous contrast. RADIATION DOSE REDUCTION: This exam was performed according to the departmental dose-optimization program which includes automated exposure control, adjustment of the mA and/or kV according to  patient size and/or use of iterative reconstruction technique. COMPARISON:  10/05/2022.  09/07/2022 . FINDINGS: Brain: Stable hyperattenuation in sulci of  the high left frontal region comparing to exam from earlier today but decreased comparing to 09/07/2022. No evidence for interval rebleeding since the study from earlier today. Diffuse loss of parenchymal volume is consistent with atrophy. Patchy low attenuation in the deep hemispheric and periventricular white matter is nonspecific, but likely reflects chronic microvascular ischemic demyelination. No findings to suggest acute infarct. Vascular: No hyperdense vessel or unexpected calcification. Skull: No evidence for fracture. No worrisome lytic or sclerotic lesion. Sinuses/Orbits: Chronic opacification right sphenoid sinus again noted with mucosal disease in the left sphenoid sinus and posterior ethmoid air cells. Mastoid air cells are clear bilaterally. Visualized portions of the globes and intraorbital fat are unremarkable. Other: None. IMPRESSION: 1. Stable hyperattenuation in sulci of the high left frontal region comparing to exam from earlier today but decreased comparing to 09/07/2022. No evidence for interval further bleeding since the study from earlier today. Findings are likely residual from the blood products identified on the 09/07/2022 exam although, technically, trace interval rebleeding between the 09/17/2022 exam and the CT scan from earlier today cannot be completely excluded. 2. Atrophy with chronic small vessel ischemic disease. 3. Chronic paranasal sinus disease. Electronically Signed   By: Misty Stanley M.D.   On: 10/06/2022 06:56   CT Head Wo Contrast  Addendum Date: 10/06/2022   ADDENDUM REPORT: 10/05/2022 23:23 ADDENDUM: These results were called by telephone at the time of interpretation on 10/05/2022 at 11:23 pm to provider Dr. Stark Jock, who verbally acknowledged these results. Electronically Signed   By: Iven Finn M.D.   On: 10/05/2022 23:23   Result Date: 10/05/2022 CLINICAL DATA:  Mental status change, unknown cause EXAM: CT HEAD WITHOUT CONTRAST TECHNIQUE: Contiguous axial  images were obtained from the base of the skull through the vertex without intravenous contrast. RADIATION DOSE REDUCTION: This exam was performed according to the departmental dose-optimization program which includes automated exposure control, adjustment of the mA and/or kV according to patient size and/or use of iterative reconstruction technique. COMPARISON:  CT head 09/07/2022 FINDINGS: Brain: Patchy and confluent areas of decreased attenuation are noted throughout the deep and periventricular white matter of the cerebral hemispheres bilaterally, compatible with chronic microvascular ischemic disease. No evidence of large-territorial acute infarction. No parenchymal hemorrhage. No mass lesion. Trace curvilinear hyperdensity along the left frontal lobe sulci (2:20 1-23) in the same location as prior subarachnoid hemorrhage dating 09/07/2022. No mass effect or midline shift. No hydrocephalus. Basilar cisterns are patent. Vascular: No hyperdense vessel. Skull: No acute fracture or focal lesion. Sinuses/Orbits: Complete opacification of the right sphenoid sinus with associated sphenoid wall hypertrophy consistent with chronic sinusitis. Mucosal thickening of the left sphenoid sinus and bilateral ethmoid sinuses. Paranasal sinuses and mastoid air cells are clear. The orbits are unremarkable. Other: None. IMPRESSION: Trace curvilinear hyperdensity along the left frontal lobe sulci. Finding could represent an acute subarachnoid hemorrhage versus residual of prior bleed on 09/07/2022. Recommend repeat CT in 6 hours to evaluate for evolution/stability. Electronically Signed: By: Iven Finn M.D. On: 10/05/2022 23:18      LOS: 1 day    Cordelia Poche, MD [facility] 10/07/2022, 8:17 AM   If 7PM-7AM, please contact night-coverage [email or web address]

## 2022-10-07 NOTE — TOC Progression Note (Signed)
  Transition of Care [facility]) Screening Note   Patient Details  Name: Amber Crosby Date of Birth: [birth year]   Transition of Care [facility]) CM/SW Contact:    Servando Snare, LCSW Phone Number: 10/07/2022, 8:53 AM    Transition of Care Department [facility]) has reviewed patient and no TOC needs have been identified at this time. We will continue to monitor patient advancement through interdisciplinary progression rounds. If new patient transition needs arise, please place a TOC consult.

## 2022-10-07 NOTE — Hospital Course (Addendum)
Amber Crosby is a 77 y.o. female with a history of diabetes mellitus type 2, hypertension, hyperlipidemia, cirrhosis, portal vein thrombosis, anxiety, SAH. Patient presented secondary to altered mental status with evidence of urinary tract infection. Empiric antibiotics started with improvement of mental status back to baseline. Hospitalization complicated by development of acute hyponatremia. Hyponatremia likely secondary to dehydration/low solute. Nephrology consulted secondary to persistent hyponatremia. Patient improved with IV fluids and initiation of sodium chloride tablets. Recommendation to continue tablets on discharge and obtain repeat labwork this week. Patient to follow-up with outpatient physicians.

## 2022-10-07 NOTE — Evaluation (Signed)
Physical Therapy Evaluation Patient Details Name: Amber Crosby MRN: [id number] DOB: 10/01/1945 Today's Date: 10/07/2022  History of Present Illness  77 y.o. female with medical history significant of DM2, HTN, HLD, cirrhosis, portal vein thrombosis, anxiety, SAH. Presenting with altered mental status. Dx of UTI, metabolic encephalopathy.  Clinical Impression  Pt admitted with above diagnosis. Pt ambulated 90' without an assistive device, no loss of balance, but mildly unsteady and slow cadence. Supervision recommended for mobility.  Pt currently with functional limitations due to the deficits listed below (see PT Problem List). Pt will benefit from skilled PT to increase their independence and safety with mobility to allow discharge to the venue listed below.          Recommendations for follow up therapy are one component of a multi-disciplinary discharge planning process, led by the attending physician.  Recommendations may be updated based on patient status, additional functional criteria and insurance authorization.  Follow Up Recommendations No PT follow up      Assistance Recommended at Discharge Set up Supervision/Assistance  Patient can return home with the following  A little help with bathing/dressing/bathroom;Help with stairs or ramp for entrance;Assistance with cooking/housework    Equipment Recommendations None recommended by PT  Recommendations for Other Services       Functional Status Assessment Patient has had a recent decline in their functional status and demonstrates the ability to make significant improvements in function in a reasonable and predictable amount of time.     Precautions / Restrictions Precautions Precautions: Fall Precaution Comments: 1 fall in past 6 months, tripped while walking to mailbox in December 2023 Restrictions Weight Bearing Restrictions: No      Mobility  Bed Mobility Overal bed mobility: Modified Independent              General bed mobility comments: increased time, HOB up, used rail    Transfers Overall transfer level: Needs assistance   Transfers: Sit to/from Stand Sit to Stand: Supervision           General transfer comment: increased time, no loss of balance    Ambulation/Gait Ambulation/Gait assistance: Supervision Gait Distance (Feet): 90 Feet Assistive device: None Gait Pattern/deviations: Decreased stride length Gait velocity: decr     General Gait Details: slow but steady, no loss of balance, mildly unsteady, occaissional reaching for hand rail  Stairs            Wheelchair Mobility    Modified Rankin (Stroke Patients Only)       Balance Overall balance assessment: Needs assistance   Sitting balance-Leahy Scale: Good       Standing balance-Leahy Scale: Good                               Pertinent Vitals/Pain Pain Assessment Pain Assessment: No/denies pain    Home Living Family/patient expects to be discharged to:: Private residence Living Arrangements: Spouse/significant other;Children Available Help at Discharge: Family;Available 24 hours/day Type of Home: House Home Access: Stairs to enter   [employer] of Steps: 2   Home Layout: One level Home Equipment: Transport chair Additional Comments: husband is on hospice, pt bathes/dresses him    Prior Function Prior Level of Function : Independent/Modified Independent             Mobility Comments: 1 fall in past 6 months, tripped walking to [employer], walks without AD ADLs Comments: independent     Hand Dominance  Extremity/Trunk Assessment   Upper Extremity Assessment Upper Extremity Assessment: Overall WFL for tasks assessed    Lower Extremity Assessment Lower Extremity Assessment: Overall WFL for tasks assessed    Cervical / Trunk Assessment Cervical / Trunk Assessment: Normal  Communication   Communication: No difficulties  Cognition  Arousal/Alertness: Awake/alert Behavior During Therapy: WFL for tasks assessed/performed Overall Cognitive Status: Within Functional Limits for tasks assessed                                          General Comments      Exercises     Assessment/Plan    PT Assessment Patient needs continued PT services  PT Problem List Decreased activity tolerance       PT Treatment Interventions Gait training;Therapeutic exercise;Balance training    PT Goals (Current goals can be found in the Care Plan section)  Acute Rehab PT Goals Patient Stated Goal: be able to care for husband who is on hospice PT Goal Formulation: With patient/family Time For Goal Achievement: 10/21/22 Potential to Achieve Goals: Good    Frequency Min 3X/week     Co-evaluation               AM-PAC PT "6 Clicks" Mobility  Outcome Measure Help needed turning from your back to your side while in a flat bed without using bedrails?: None Help needed moving from lying on your back to sitting on the side of a flat bed without using bedrails?: A Little Help needed moving to and from a bed to a chair (including a wheelchair)?: None Help needed standing up from a chair using your arms (e.g., wheelchair or bedside chair)?: None Help needed to walk in hospital room?: A Little Help needed climbing 3-5 steps with a railing? : A Little 6 Click Score: 21    End of Session Equipment Utilized During Treatment: Gait belt Activity Tolerance: Patient tolerated treatment well Patient left: in chair;with call Winnie/phone within reach;with family/visitor present Nurse Communication: Mobility status PT Visit Diagnosis: Difficulty in walking, not elsewhere classified (R26.2)    Time: 5947-0761 PT Time Calculation (min) (ACUTE ONLY): 18 min   Charges:   PT Evaluation $PT Eval Low Complexity: 1 Low         Blondell Reveal Kistler PT 10/07/2022  Acute Rehabilitation Services  Office  [phone]

## 2022-10-08 DIAGNOSIS — I10 Essential (primary) hypertension: Secondary | ICD-10-CM | POA: Diagnosis not present

## 2022-10-08 DIAGNOSIS — K766 Portal hypertension: Secondary | ICD-10-CM | POA: Diagnosis not present

## 2022-10-08 DIAGNOSIS — N390 Urinary tract infection, site not specified: Secondary | ICD-10-CM | POA: Diagnosis not present

## 2022-10-08 DIAGNOSIS — E871 Hypo-osmolality and hyponatremia: Secondary | ICD-10-CM

## 2022-10-08 DIAGNOSIS — G9341 Metabolic encephalopathy: Secondary | ICD-10-CM | POA: Diagnosis not present

## 2022-10-08 LAB — BASIC METABOLIC PANEL
Anion gap: 13 (ref 5–15)
Anion gap: 9 (ref 5–15)
BUN: 17 mg/dL (ref 8–23)
BUN: 17 mg/dL (ref 8–23)
CO2: 19 mmol/L — ABNORMAL LOW (ref 22–32)
CO2: 20 mmol/L — ABNORMAL LOW (ref 22–32)
Calcium: 8.2 mg/dL — ABNORMAL LOW (ref 8.9–10.3)
Calcium: 7.8 mg/dL — ABNORMAL LOW (ref 8.9–10.3)
Chloride: 98 mmol/L (ref 98–111)
Chloride: 98 mmol/L (ref 98–111)
Creatinine, Ser: 0.53 mg/dL (ref 0.44–1.00)
Creatinine, Ser: 0.53 mg/dL (ref 0.44–1.00)
GFR, Estimated: >60 mL/min (ref >60)
GFR, Estimated: >60 mL/min (ref >60)
Glucose, Bld: 159 mg/dL — ABNORMAL HIGH (ref 70–99)
Glucose, Bld: 193 mg/dL — ABNORMAL HIGH (ref 70–99)
Potassium: 3.9 mmol/L (ref 3.5–5.1)
Potassium: 4.1 mmol/L (ref 3.5–5.1)
Sodium: 130 mmol/L — ABNORMAL LOW (ref 135–145)
Sodium: 127 mmol/L — ABNORMAL LOW (ref 135–145)

## 2022-10-08 LAB — URINE CULTURE: Culture: NO GROWTH

## 2022-10-08 LAB — SODIUM: Sodium: 124 mmol/L — ABNORMAL LOW (ref 135–145)

## 2022-10-08 LAB — GLUCOSE, CAPILLARY
Glucose-Capillary: 163 mg/dL — ABNORMAL HIGH (ref 70–99)
Glucose-Capillary: 175 mg/dL — ABNORMAL HIGH (ref 70–99)
Glucose-Capillary: 179 mg/dL — ABNORMAL HIGH (ref 70–99)
Glucose-Capillary: 244 mg/dL — ABNORMAL HIGH (ref 70–99)

## 2022-10-08 LAB — PROCALCITONIN: Procalcitonin: 0.18 ng/mL

## 2022-10-08 LAB — CBC WITH DIFFERENTIAL/PLATELET
Abs Immature Granulocytes: 0.04 10*3/uL (ref 0.00–0.07)
Basophils Absolute: 0 10*3/uL (ref 0.0–0.1)
Basophils Relative: 1 %
Eosinophils Absolute: 0.1 10*3/uL (ref 0.0–0.5)
Eosinophils Relative: 1 %
HCT: 28.4 % — ABNORMAL LOW (ref 36.0–46.0)
Hemoglobin: 9.4 g/dL — ABNORMAL LOW (ref 12.0–15.0)
Immature Granulocytes: 1 %
Lymphocytes Relative: 9 %
Lymphs Abs: 0.5 10*3/uL — ABNORMAL LOW (ref 0.7–4.0)
MCH: 28.2 pg (ref 26.0–34.0)
MCHC: 33.1 g/dL (ref 30.0–36.0)
MCV: 85.3 fL (ref 80.0–100.0)
Monocytes Absolute: 0.8 10*3/uL (ref 0.1–1.0)
Monocytes Relative: 14 %
Neutro Abs: 4.2 10*3/uL (ref 1.7–7.7)
Neutrophils Relative %: 74 %
Platelets: 99 10*3/uL — ABNORMAL LOW (ref 150–400)
RBC: 3.33 MIL/uL — ABNORMAL LOW (ref 3.87–5.11)
RDW: 14.6 % (ref 11.5–15.5)
WBC: 5.7 10*3/uL (ref 4.0–10.5)
nRBC: 0 % (ref 0.0–0.2)

## 2022-10-08 LAB — SODIUM, URINE, RANDOM: Sodium, Ur: 18 mmol/L

## 2022-10-08 MED ORDER — SODIUM CHLORIDE 0.9 % IV BOLUS
500.0000 mL | Freq: Once | INTRAVENOUS | Status: AC
  Administered 2022-10-08: 500 mL via INTRAVENOUS

## 2022-10-08 MED ORDER — LACTULOSE 10 GM/15ML PO SOLN
10.0000 g | Freq: Two times a day (BID) | ORAL | Status: DC
  Administered 2022-10-08: 10 g via ORAL
  Filled 2022-10-08: qty 15

## 2022-10-08 MED ORDER — SODIUM CHLORIDE 0.9 % IV BOLUS: 500.0000 mL | Freq: Once | INTRAVENOUS | Status: DC

## 2022-10-08 MED ORDER — LACTULOSE 10 GM/15ML PO SOLN
10.0000 g | Freq: Every day | ORAL | Status: DC
  Administered 2022-10-09 – 2022-10-10 (×2): 10 g via ORAL
  Filled 2022-10-08 (×3): qty 15

## 2022-10-08 NOTE — Progress Notes (Signed)
PROGRESS NOTE    Amber Crosby  IWP:[id number] DOB: Jan 13, 1946 DOA: 10/05/2022 PCP: Antony Contras, MD   Brief Narrative: Amber Crosby is a 77 y.o. female with a history of diabetes mellitus type 2, hypertension, hyperlipidemia, cirrhosis, portal vein thrombosis, anxiety, SAH. Patient presented secondary to altered mental status with evidence of urinary tract infection. Empiric antibiotics started with improvement of mental status, without achieving complete baseline.   Assessment and Plan:  UTI Urinalysis suggests infection. No urine culture obtained on admission. Empiric Ceftriaxone started. Urine culture (obtained after antibiotics) with no growth) -Continue Ceftriaxone IV  Fever New. Possibly related to UTI, which would make it more complicated. Urine culture (obtained after antibiotics) with no growth. Blood culture with no growth to date. -Blood cultures, procalcitonin  Acute metabolic/hepatic encephalopathy Likely multifactorial secondary to hepatic encephalopathy and UTI. Mental status improved from admission but still not at baseline. Patient not restarted on home lactulose. -Continue lactulose 10 gm daily  Acute hyponatremia Sodium of 132 on admission with downtrend. Possibly related to dehydration vs hydrochlorothiazide vs combination. No edema noted. Urine sodium of 18 with urine osmolality pending. Down to 127 this morning. -Repeat sodium/BMP -Follow-up urine osmolality  Generalized weakness Patient evaluated by PT with recommendations for no PT follow-up.  Thrombocytopenia Chronic. Stable.  Diabetes mellitus, type 2 -Continue SSI  Primary hypertension -Continue amlodipine, hydrochlorothiazide and losartan  Hyperlipidemia -Continue Crestor  Hypokalemia Mild. Potassium supplementation given.  Normocytic anemia Stable.  Anxiety Noted.  Subarachnoid hemorrhage Secondary to history of fall. Chronic issue.  Cirrhosis Portal vein thrombosis Portal  hypertension Patient follows with [facility] hepatology. Chronic hyperbilirubinemia. -Continue nadolol  DVT prophylaxis: SCDs Code Status:   Code Status: Full Code Family Communication: Daughter at bedside Disposition Plan: Discharge possible in 1 day pending improvement of sodium   Consultants:  None  Procedures:  None  Antimicrobials: Ceftriaxone IV   Subjective: Patient feels much better today. No issues overnight.  Objective: BP 123/61 (BP Location: Left Arm)   Pulse 68   Temp 98.4 F (36.9 C) (Oral)   Resp 18   Wt 68.8 kg   SpO2 96%   BMI 27.74 kg/m   Examination:  General exam: Appears calm and comfortable Respiratory system: Clear to auscultation. Respiratory effort normal. Cardiovascular system: S1 & S2 heard, RRR. Gastrointestinal system: Abdomen is nondistended, soft and nontender. Normal bowel sounds heard. Central nervous system: Alert and oriented. No focal neurological deficits. Musculoskeletal: No edema. No calf tenderness Skin: No cyanosis. No rashes Psychiatry: Judgement and insight appear normal. Mood & affect appropriate.    Data Reviewed: I have personally reviewed following labs and imaging studies  CBC Lab Results  Component Value Date   WBC 5.7 10/08/2022   RBC 3.33 (L) 10/08/2022   HGB 9.4 (L) 10/08/2022   HCT 28.4 (L) 10/08/2022   MCV 85.3 10/08/2022   MCH 28.2 10/08/2022   PLT 99 (L) 10/08/2022   MCHC 33.1 10/08/2022   RDW 14.6 10/08/2022   LYMPHSABS 0.5 (L) 10/08/2022   MONOABS 0.8 10/08/2022   EOSABS 0.1 10/08/2022   BASOSABS 0.0 50/53/9767     Last metabolic panel Lab Results  Component Value Date   NA 127 (L) 10/08/2022   K 4.1 10/08/2022   CL 98 10/08/2022   CO2 20 (L) 10/08/2022   BUN 17 10/08/2022   CREATININE 0.53 10/08/2022   GLUCOSE 193 (H) 10/08/2022   GFRNONAA >60 10/08/2022   GFRAA >60 08/29/2018   CALCIUM 7.8 (L)  10/08/2022   PROT 6.5 10/06/2022   ALBUMIN 3.2 (L) 10/06/2022   LABGLOB 2.2  03/03/2018   AGRATIO 2.0 03/03/2018   BILITOT 2.0 (H) 10/06/2022   ALKPHOS 53 10/06/2022   AST 31 10/06/2022   ALT 26 10/06/2022   ANIONGAP 9 10/08/2022    GFR: Estimated Creatinine Clearance: 54.4 mL/min (by C-G formula based on SCr of 0.53 mg/dL).  Recent Results (from the past 240 hour(s))  Resp panel by RT-PCR (RSV, Flu A&B, Covid) Anterior Nasal Swab     Status: None   Collection Time: 10/06/22  8:35 AM   Specimen: Anterior Nasal Swab  Result Value Ref Range Status   SARS Coronavirus 2 by RT PCR NEGATIVE NEGATIVE Final    Comment: (NOTE) SARS-CoV-2 target nucleic acids are NOT DETECTED.  The SARS-CoV-2 RNA is generally detectable in upper respiratory specimens during the acute phase of infection. The lowest concentration of SARS-CoV-2 viral copies this assay can detect is 138 copies/mL. A negative result does not preclude SARS-Cov-2 infection and should not be used as the sole basis for treatment or other patient management decisions. A negative result may occur with  improper specimen collection/handling, submission of specimen other than nasopharyngeal swab, presence of viral mutation(s) within the areas targeted by this assay, and inadequate number of viral copies(<138 copies/mL). A negative result must be combined with clinical observations, patient history, and epidemiological information. The expected result is Negative.  Fact Sheet for Patients:  EntrepreneurPulse.com.au  Fact Sheet for Healthcare Providers:  IncredibleEmployment.be  This test is no t yet approved or cleared by the [place] FDA and  has been authorized for detection and/or diagnosis of SARS-CoV-2 by FDA under an Emergency Use Authorization (EUA). This EUA will remain  in effect (meaning this test can be used) for the duration of the COVID-19 declaration under Section 564(b)(1) of the Act, 21 U.S.C.section 360bbb-3(b)(1), unless the authorization is  terminated  or revoked sooner.       Influenza A by PCR NEGATIVE NEGATIVE Final   Influenza B by PCR NEGATIVE NEGATIVE Final    Comment: (NOTE) The Xpert Xpress SARS-CoV-2/FLU/RSV plus assay is intended as an aid in the diagnosis of influenza from Nasopharyngeal swab specimens and should not be used as a sole basis for treatment. Nasal washings and aspirates are unacceptable for Xpert Xpress SARS-CoV-2/FLU/RSV testing.  Fact Sheet for Patients: EntrepreneurPulse.com.au  Fact Sheet for Healthcare Providers: IncredibleEmployment.be  This test is not yet approved or cleared by the [place] FDA and has been authorized for detection and/or diagnosis of SARS-CoV-2 by FDA under an Emergency Use Authorization (EUA). This EUA will remain in effect (meaning this test can be used) for the duration of the COVID-19 declaration under Section 564(b)(1) of the Act, 21 U.S.C. section 360bbb-3(b)(1), unless the authorization is terminated or revoked.     Resp Syncytial Virus by PCR NEGATIVE NEGATIVE Final    Comment: (NOTE) Fact Sheet for Patients: EntrepreneurPulse.com.au  Fact Sheet for Healthcare Providers: IncredibleEmployment.be  This test is not yet approved or cleared by the [place] FDA and has been authorized for detection and/or diagnosis of SARS-CoV-2 by FDA under an Emergency Use Authorization (EUA). This EUA will remain in effect (meaning this test can be used) for the duration of the COVID-19 declaration under Section 564(b)(1) of the Act, 21 U.S.C. section 360bbb-3(b)(1), unless the authorization is terminated or revoked.  Performed at [facility], [place] [address], [place], [place] [address]   Urine Culture  Status: None   Collection Time: 10/07/22  8:19 AM   Specimen: In/Out Cath Urine  Result Value Ref Range Status   Specimen Description   Final    IN/OUT CATH  URINE Performed at [facility], [place] [address] [place], [place] [address]    Special Requests   Final    NONE Performed at [facility], [place] [address] [place], [place] [address]    Culture   Final    NO GROWTH Performed at [facility], [place] [address] [place], [place] [address]    Report Status 10/08/2022 FINAL  Final  Culture, blood (Routine X 2) w Reflex to ID Panel     Status: None (Preliminary result)   Collection Time: 10/07/22  2:43 PM   Specimen: BLOOD  Result Value Ref Range Status   Specimen Description   Final    BLOOD BLOOD LEFT HAND Performed at [place] [address], [place], [place] [address]    Special Requests   Final    BOTTLES DRAWN AEROBIC AND ANAEROBIC Blood Culture adequate volume Performed at [place] [address], [place], [place] [address]    Culture   Final    NO GROWTH < 24 HOURS Performed at [place] [address] [place], [place] [address]    Report Status PENDING  Incomplete  Culture, blood (Routine X 2) w Reflex to ID Panel     Status: None (Preliminary result)   Collection Time: 10/07/22  2:49 PM   Specimen: BLOOD  Result Value Ref Range Status   Specimen Description   Final    BLOOD BLOOD RIGHT HAND Performed at [place] [address], [place], [place] [address]    Special Requests   Final    BOTTLES DRAWN AEROBIC AND ANAEROBIC Blood Culture adequate volume Performed at [place] [address] [place], [place] [address]    Culture   Final    NO GROWTH < 24 HOURS Performed at [place] [address] [place],  [address]    Report Status PENDING  Incomplete      Radiology Studies: No results found.    LOS: 2 days    Cordelia Poche, MD [facility] 10/08/2022, 5:35 PM   If 7PM-7AM, please contact night-coverage [email or web address]

## 2022-10-09 ENCOUNTER — Encounter (HOSPITAL_COMMUNITY): Payer: Self-pay | Admitting: Internal Medicine

## 2022-10-09 ENCOUNTER — Inpatient Hospital Stay (HOSPITAL_COMMUNITY): Payer: Medicare Other

## 2022-10-09 DIAGNOSIS — E871 Hypo-osmolality and hyponatremia: Secondary | ICD-10-CM | POA: Diagnosis not present

## 2022-10-09 DIAGNOSIS — G9341 Metabolic encephalopathy: Secondary | ICD-10-CM | POA: Diagnosis not present

## 2022-10-09 DIAGNOSIS — E119 Type 2 diabetes mellitus without complications: Secondary | ICD-10-CM

## 2022-10-09 DIAGNOSIS — N390 Urinary tract infection, site not specified: Secondary | ICD-10-CM | POA: Diagnosis not present

## 2022-10-09 LAB — BLOOD CULTURE ID PANEL (REFLEXED) - BCID2

## 2022-10-09 LAB — HEPATIC FUNCTION PANEL
ALT: 23 U/L (ref 0–44)
AST: 30 U/L (ref 15–41)
Albumin: 3.2 g/dL — ABNORMAL LOW (ref 3.5–5.0)
Alkaline Phosphatase: 70 U/L (ref 38–126)
Bilirubin, Direct: 0.6 mg/dL — ABNORMAL HIGH (ref 0.0–0.2)
Indirect Bilirubin: 1.5 mg/dL — ABNORMAL HIGH (ref 0.3–0.9)
Total Bilirubin: 2.1 mg/dL — ABNORMAL HIGH (ref 0.3–1.2)
Total Protein: 6.8 g/dL (ref 6.5–8.1)

## 2022-10-09 LAB — TSH: TSH: 1.037 u[IU]/mL (ref 0.350–4.500)

## 2022-10-09 LAB — PROCALCITONIN: Procalcitonin: <0.1 ng/mL

## 2022-10-09 LAB — BASIC METABOLIC PANEL
Anion gap: 6 (ref 5–15)
BUN: 17 mg/dL (ref 8–23)
CO2: 23 mmol/L (ref 22–32)
Calcium: 7.8 mg/dL — ABNORMAL LOW (ref 8.9–10.3)
Chloride: 97 mmol/L — ABNORMAL LOW (ref 98–111)
Creatinine, Ser: 0.53 mg/dL (ref 0.44–1.00)
GFR, Estimated: >60 mL/min (ref >60)
Glucose, Bld: 173 mg/dL — ABNORMAL HIGH (ref 70–99)
Potassium: 4.4 mmol/L (ref 3.5–5.1)
Sodium: 126 mmol/L — ABNORMAL LOW (ref 135–145)

## 2022-10-09 LAB — GLUCOSE, CAPILLARY
Glucose-Capillary: 157 mg/dL — ABNORMAL HIGH (ref 70–99)
Glucose-Capillary: 196 mg/dL — ABNORMAL HIGH (ref 70–99)
Glucose-Capillary: 272 mg/dL — ABNORMAL HIGH (ref 70–99)
Glucose-Capillary: 221 mg/dL — ABNORMAL HIGH (ref 70–99)

## 2022-10-09 LAB — SODIUM
Sodium: 125 mmol/L — ABNORMAL LOW (ref 135–145)
Sodium: 126 mmol/L — ABNORMAL LOW (ref 135–145)
Sodium: 122 mmol/L — ABNORMAL LOW (ref 135–145)
Sodium: 126 mmol/L — ABNORMAL LOW (ref 135–145)

## 2022-10-09 LAB — OSMOLALITY, URINE: Osmolality, Ur: 714 mOsm/kg (ref 300–900)

## 2022-10-09 LAB — SODIUM, URINE, RANDOM: Sodium, Ur: <10 mmol/L

## 2022-10-09 MED ORDER — CALCIUM CARBONATE ANTACID 500 MG PO CHEW
1.0000 | CHEWABLE_TABLET | Freq: Three times a day (TID) | ORAL | Status: AC
  Administered 2022-10-09 – 2022-10-10 (×3): 200 mg via ORAL
  Filled 2022-10-09 (×3): qty 1

## 2022-10-09 MED ORDER — SODIUM CHLORIDE 0.9 % IV SOLN: INTRAVENOUS | Status: DC

## 2022-10-09 NOTE — Progress Notes (Signed)
PHARMACY - PHYSICIAN COMMUNICATION CRITICAL VALUE ALERT - BLOOD CULTURE IDENTIFICATION (BCID)  Amber Crosby is an 76 y.o. female who presented to [facility] on 10/05/2022 with UTI and acute metabolic encephalopathy.  Assessment:  BCID + Staph epidermidis (methicillin resistant) in 1 out of 4 bottles;  WBC wnl; patient currently afebrile  Name of physician (or Provider) Contacted: Raenette Rover, FNP  Current antibiotics: Ceftriaxone (UTI)  Changes to prescribed antibiotics recommended:  Patient is on recommended antibiotics - No changes needed  Results for orders placed or performed during the hospital encounter of 10/05/22  Blood Culture ID Panel (Reflexed) (Collected: 10/07/2022  2:43 PM)  Result Value Ref Range   Enterococcus faecalis NOT DETECTED NOT DETECTED   Enterococcus Faecium NOT DETECTED NOT DETECTED   Listeria monocytogenes NOT DETECTED NOT DETECTED   Staphylococcus species DETECTED (A) NOT DETECTED   Staphylococcus aureus (BCID) NOT DETECTED NOT DETECTED   Staphylococcus epidermidis DETECTED (A) NOT DETECTED   Staphylococcus lugdunensis NOT DETECTED NOT DETECTED   Streptococcus species NOT DETECTED NOT DETECTED   Streptococcus agalactiae NOT DETECTED NOT DETECTED   Streptococcus pneumoniae NOT DETECTED NOT DETECTED   Streptococcus pyogenes NOT DETECTED NOT DETECTED   A.calcoaceticus-baumannii NOT DETECTED NOT DETECTED   Bacteroides fragilis NOT DETECTED NOT DETECTED   Enterobacterales NOT DETECTED NOT DETECTED   Enterobacter cloacae complex NOT DETECTED NOT DETECTED   Escherichia coli NOT DETECTED NOT DETECTED   Klebsiella aerogenes NOT DETECTED NOT DETECTED   Klebsiella oxytoca NOT DETECTED NOT DETECTED   Klebsiella pneumoniae NOT DETECTED NOT DETECTED   Proteus species NOT DETECTED NOT DETECTED   Salmonella species NOT DETECTED NOT DETECTED   Serratia marcescens NOT DETECTED NOT DETECTED   Haemophilus influenzae NOT DETECTED NOT DETECTED   Neisseria meningitidis  NOT DETECTED NOT DETECTED   Pseudomonas aeruginosa NOT DETECTED NOT DETECTED   Stenotrophomonas maltophilia NOT DETECTED NOT DETECTED   Candida albicans NOT DETECTED NOT DETECTED   Candida auris NOT DETECTED NOT DETECTED   Candida glabrata NOT DETECTED NOT DETECTED   Candida krusei NOT DETECTED NOT DETECTED   Candida parapsilosis NOT DETECTED NOT DETECTED   Candida tropicalis NOT DETECTED NOT DETECTED   Cryptococcus neoformans/gattii NOT DETECTED NOT DETECTED   Methicillin resistance mecA/C DETECTED (A) NOT DETECTED    Soriya Worster, Toribio Harbour, PharmD 10/09/2022  4:34 AM

## 2022-10-09 NOTE — Progress Notes (Signed)
PROGRESS NOTE    Amber Crosby  ZLD:[id number] DOB: 12-15-1945 DOA: 10/05/2022 PCP: Antony Contras, MD   Brief Narrative: LASHENA SIGNER is a 77 y.o. female with a history of diabetes mellitus type 2, hypertension, hyperlipidemia, cirrhosis, portal vein thrombosis, anxiety, SAH. Patient presented secondary to altered mental status with evidence of urinary tract infection. Empiric antibiotics started with improvement of mental status back to baseline. Hospitalization complicated by development of acute hyponatremia.   Assessment and Plan:  UTI Urinalysis suggests infection. No urine culture obtained on admission. Empiric Ceftriaxone started. Urine culture (obtained after antibiotics) with no growth) -Continue Ceftriaxone IV  Fever New. Possibly related to UTI, which would make it more complicated. Urine culture (obtained after antibiotics) with no growth. Blood culture with no growth to date. Procalcitonin trended down.  Positive blood culture 1/4 bottles significant for staphylococcus epidermidis, consistent with likely contaminant. Will not treat as patient has also clinically improved significant with treatment of UTI.  Acute metabolic/hepatic encephalopathy Likely multifactorial secondary to hepatic encephalopathy and UTI. Mental status improved from admission but still not at baseline. Patient not restarted on home lactulose. -Continue lactulose 10 gm daily  Acute hyponatremia Sodium of 132 on admission with downtrend. Possibly related to dehydration vs hydrochlorothiazide vs combination. No edema noted. Urine sodium of 18 with urine osmolality of 714. Sodium down to 126 this morning with trend this afternoon to 122. Repeat urine sodium undetectable after IV fluid challenge, consistent with dehydration. Nephrology consulted. -Normal saline @ 75 mL/hr -Check sodium q4 hours -Follow-up repeat urine osmolality -Follow-up nephrology recommendations  Generalized weakness Patient  evaluated by PT with recommendations for no PT follow-up.  Thrombocytopenia Chronic. Stable.  Diabetes mellitus, type 2 -Continue SSI  Primary hypertension -Continue amlodipine, hydrochlorothiazide and losartan  Hyperlipidemia -Continue Crestor  Hypokalemia Mild. Potassium supplementation given.  Normocytic anemia Stable.  Anxiety Noted.  Subarachnoid hemorrhage Secondary to history of fall. Chronic issue.  Cirrhosis Portal vein thrombosis Portal hypertension Patient follows with [facility] hepatology. Chronic hyperbilirubinemia. -Continue nadolol  DVT prophylaxis: SCDs Code Status:   Code Status: Full Code Family Communication: Daughter at bedside. Daughter on telephone. Disposition Plan: Discharge possible in 1 day pending improvement of sodium   Consultants:  Nephrology  Procedures:  None  Antimicrobials: Ceftriaxone IV   Subjective: No concerns this morning. Feels well.  Objective: BP 123/65 (BP Location: Left Arm)   Pulse 67   Temp 97.8 F (36.6 C) (Oral)   Resp 18   Wt 68.8 kg   SpO2 100%   BMI 27.74 kg/m   Examination:  General exam: Appears calm and comfortable Respiratory system: Clear to auscultation. Respiratory effort normal. Gastrointestinal system: Abdomen is nondistended Central nervous system: Alert and oriented. No focal neurological deficits. Musculoskeletal: No edema. No calf tenderness Psychiatry: Judgement and insight appear normal. Mood & affect appropriate.    Data Reviewed: I have personally reviewed following labs and imaging studies  CBC Lab Results  Component Value Date   WBC 5.7 10/08/2022   RBC 3.33 (L) 10/08/2022   HGB 9.4 (L) 10/08/2022   HCT 28.4 (L) 10/08/2022   MCV 85.3 10/08/2022   MCH 28.2 10/08/2022   PLT 99 (L) 10/08/2022   MCHC 33.1 10/08/2022   RDW 14.6 10/08/2022   LYMPHSABS 0.5 (L) 10/08/2022   MONOABS 0.8 10/08/2022   EOSABS 0.1 10/08/2022   BASOSABS 0.0 10/08/2022     Last  metabolic panel Lab Results  Component Value Date   NA 122 (  L) 10/09/2022   K 4.4 10/09/2022   CL 97 (L) 10/09/2022   CO2 23 10/09/2022   BUN 17 10/09/2022   CREATININE 0.53 10/09/2022   GLUCOSE 173 (H) 10/09/2022   GFRNONAA >60 10/09/2022   GFRAA >60 08/29/2018   CALCIUM 7.8 (L) 10/09/2022   PROT 6.5 10/06/2022   ALBUMIN 3.2 (L) 10/06/2022   LABGLOB 2.2 03/03/2018   AGRATIO 2.0 03/03/2018   BILITOT 2.0 (H) 10/06/2022   ALKPHOS 53 10/06/2022   AST 31 10/06/2022   ALT 26 10/06/2022   ANIONGAP 6 10/09/2022    GFR: Estimated Creatinine Clearance: 54.4 mL/min (by C-G formula based on SCr of 0.53 mg/dL).  Recent Results (from the past 240 hour(s))  Resp panel by RT-PCR (RSV, Flu A&B, Covid) Anterior Nasal Swab     Status: None   Collection Time: 10/06/22  8:35 AM   Specimen: Anterior Nasal Swab  Result Value Ref Range Status   SARS Coronavirus 2 by RT PCR NEGATIVE NEGATIVE Final    Comment: (NOTE) SARS-CoV-2 target nucleic acids are NOT DETECTED.  The SARS-CoV-2 RNA is generally detectable in upper respiratory specimens during the acute phase of infection. The lowest concentration of SARS-CoV-2 viral copies this assay can detect is 138 copies/mL. A negative result does not preclude SARS-Cov-2 infection and should not be used as the sole basis for treatment or other patient management decisions. A negative result may occur with  improper specimen collection/handling, submission of specimen other than nasopharyngeal swab, presence of viral mutation(s) within the areas targeted by this assay, and inadequate number of viral copies(<138 copies/mL). A negative result must be combined with clinical observations, patient history, and epidemiological information. The expected result is Negative.  Fact Sheet for Patients:  EntrepreneurPulse.com.au  Fact Sheet for Healthcare Providers:  IncredibleEmployment.be  This test is no t yet approved  or cleared by the [place] FDA and  has been authorized for detection and/or diagnosis of SARS-CoV-2 by FDA under an Emergency Use Authorization (EUA). This EUA will remain  in effect (meaning this test can be used) for the duration of the COVID-19 declaration under Section 564(b)(1) of the Act, 21 U.S.C.section 360bbb-3(b)(1), unless the authorization is terminated  or revoked sooner.       Influenza A by PCR NEGATIVE NEGATIVE Final   Influenza B by PCR NEGATIVE NEGATIVE Final    Comment: (NOTE) The Xpert Xpress SARS-CoV-2/FLU/RSV plus assay is intended as an aid in the diagnosis of influenza from Nasopharyngeal swab specimens and should not be used as a sole basis for treatment. Nasal washings and aspirates are unacceptable for Xpert Xpress SARS-CoV-2/FLU/RSV testing.  Fact Sheet for Patients: EntrepreneurPulse.com.au  Fact Sheet for Healthcare Providers: IncredibleEmployment.be  This test is not yet approved or cleared by the [place] FDA and has been authorized for detection and/or diagnosis of SARS-CoV-2 by FDA under an Emergency Use Authorization (EUA). This EUA will remain in effect (meaning this test can be used) for the duration of the COVID-19 declaration under Section 564(b)(1) of the Act, 21 U.S.C. section 360bbb-3(b)(1), unless the authorization is terminated or revoked.     Resp Syncytial Virus by PCR NEGATIVE NEGATIVE Final    Comment: (NOTE) Fact Sheet for Patients: EntrepreneurPulse.com.au  Fact Sheet for Healthcare Providers: IncredibleEmployment.be  This test is not yet approved or cleared by the [place] FDA and has been authorized for detection and/or diagnosis of SARS-CoV-2 by FDA under an Emergency Use Authorization (EUA). This EUA will remain in effect (meaning this  test can be used) for the duration of the COVID-19 declaration under Section 564(b)(1) of the Act,  21 U.S.C. section 360bbb-3(b)(1), unless the authorization is terminated or revoked.  Performed at [facility], [place] [address], [place], [place] [address]   Urine Culture     Status: None   Collection Time: 10/07/22  8:19 AM   Specimen: In/Out Cath Urine  Result Value Ref Range Status   Specimen Description   Final    IN/OUT CATH URINE Performed at [place] [address] [place], [place] [address]    Special Requests   Final    NONE Performed at [facility], [place] [address] [place], [place] [address]    Culture   Final    NO GROWTH Performed at [facility], [place] [address], [place], [place] [address]    Report Status 10/08/2022 FINAL  Final  Culture, blood (Routine X 2) w Reflex to ID Panel     Status: None (Preliminary result)   Collection Time: 10/07/22  2:43 PM   Specimen: BLOOD  Result Value Ref Range Status   Specimen Description   Final    BLOOD BLOOD LEFT HAND Performed at [place] [address], [place], [place] [address]    Special Requests   Final    BOTTLES DRAWN AEROBIC AND ANAEROBIC Blood Culture adequate volume Performed at [place] [address], [place], [place] [address]    Culture  Setup Time   Final    GRAM POSITIVE COCCI ANAEROBIC BOTTLE ONLY Organism ID to follow CRITICAL RESULT CALLED TO, READ BACK BY AND VERIFIED WITH: L POINDEXTER,PHARMD'@0410'$  10/09/22 [place] Performed at [facility], [address], [place], [place] [address]    Culture GRAM POSITIVE COCCI  Final   Report Status PENDING  Incomplete  Blood Culture ID Panel (Reflexed)     Status: Abnormal   Collection Time: 10/07/22  2:43 PM  Result Value Ref Range Status   Enterococcus faecalis NOT DETECTED NOT DETECTED Final   Enterococcus Faecium NOT DETECTED NOT DETECTED Final   Listeria monocytogenes NOT DETECTED NOT DETECTED Final   Staphylococcus species DETECTED  (A) NOT DETECTED Final    Comment: CRITICAL RESULT CALLED TO, READ BACK BY AND VERIFIED WITH: L POINDEXTER,PHARMD'@0411'$  10/09/22 [place]    Staphylococcus aureus (BCID) NOT DETECTED NOT DETECTED Final   Staphylococcus epidermidis DETECTED (A) NOT DETECTED Final    Comment: Methicillin (oxacillin) resistant coagulase negative staphylococcus. Possible blood culture contaminant (unless isolated from more than one blood culture draw or clinical case suggests pathogenicity). No antibiotic treatment is indicated for blood  culture contaminants. CRITICAL RESULT CALLED TO, READ BACK BY AND VERIFIED WITH: L POINDEXTER,PHARMD'@0411'$  10/09/22 [place]    Staphylococcus lugdunensis NOT DETECTED NOT DETECTED Final   Streptococcus species NOT DETECTED NOT DETECTED Final   Streptococcus agalactiae NOT DETECTED NOT DETECTED Final   Streptococcus pneumoniae NOT DETECTED NOT DETECTED Final   Streptococcus pyogenes NOT DETECTED NOT DETECTED Final   A.calcoaceticus-baumannii NOT DETECTED NOT DETECTED Final   Bacteroides fragilis NOT DETECTED NOT DETECTED Final   Enterobacterales NOT DETECTED NOT DETECTED Final   Enterobacter cloacae complex NOT DETECTED NOT DETECTED Final   Escherichia coli NOT DETECTED NOT DETECTED Final   Klebsiella aerogenes NOT DETECTED NOT DETECTED Final   Klebsiella oxytoca NOT DETECTED NOT DETECTED Final   Klebsiella pneumoniae NOT DETECTED NOT DETECTED Final   Proteus species NOT DETECTED NOT DETECTED Final  Salmonella species NOT DETECTED NOT DETECTED Final   Serratia marcescens NOT DETECTED NOT DETECTED Final   Haemophilus influenzae NOT DETECTED NOT DETECTED Final   Neisseria meningitidis NOT DETECTED NOT DETECTED Final   Pseudomonas aeruginosa NOT DETECTED NOT DETECTED Final   Stenotrophomonas maltophilia NOT DETECTED NOT DETECTED Final   Candida albicans NOT DETECTED NOT DETECTED Final   Candida auris NOT DETECTED NOT DETECTED Final   Candida glabrata NOT DETECTED NOT DETECTED Final    Candida krusei NOT DETECTED NOT DETECTED Final   Candida parapsilosis NOT DETECTED NOT DETECTED Final   Candida tropicalis NOT DETECTED NOT DETECTED Final   Cryptococcus neoformans/gattii NOT DETECTED NOT DETECTED Final   Methicillin resistance mecA/C DETECTED (A) NOT DETECTED Final    Comment: CRITICAL RESULT CALLED TO, READ BACK BY AND VERIFIED WITH: L POINDEXTER,PHARMD'@0411'$  10/09/22 [place] Performed at [facility], [address], [place], [place] [address]   Culture, blood (Routine X 2) w Reflex to ID Panel     Status: None (Preliminary result)   Collection Time: 10/07/22  2:49 PM   Specimen: BLOOD  Result Value Ref Range Status   Specimen Description   Final    BLOOD BLOOD RIGHT HAND Performed at [place] [address] [place], [place] [address]    Special Requests   Final    BOTTLES DRAWN AEROBIC AND ANAEROBIC Blood Culture adequate volume Performed at [place] [address], [place], [place] [address]    Culture   Final    NO GROWTH 2 DAYS Performed at [place] [address] [place],  [address]    Report Status PENDING  Incomplete      Radiology Studies: No results found.    LOS: 3 days    Cordelia Poche, MD [facility] 10/09/2022, 2:10 PM   If 7PM-7AM, please contact night-coverage [email or web address]

## 2022-10-09 NOTE — Progress Notes (Signed)
Notified MD on call for Pt's blood sodium level of 124. Now signs of dizziness, no changes on mental status noted.   New orders received

## 2022-10-09 NOTE — Consult Note (Signed)
Reason for Consult: Hyponatremia Referring Physician: Lonny Prude, MD  Amber Crosby is an 77 y.o. female with a PMH significant for DM type 2, HTN, HLD, h/o colon cancer, portal HTN and portal vein thrombosis (on Eliquis) due to oxaliplating chemotherapy for colon cancer, cirrhosis, and recent [facility] from a fall (09/06/22) who presented to [facility] ED via EMS on 10/05/22 with AMS.  In the ED, Temp 99.3, BP 137/65, SpO2 95%.  Labs notable for Hgb 10.1, Na 132, K 3.4, Co2 20, glucose 176, alb 3.1, T bili 2.1, INR 1.4, Ammonia 51, and UA + nitrite, + leukocytes, many bacteria, moderate blood consistent with UTI and started on IV Rocephin.  CT scan of head consitent with residual from prior [facility].  She was admitted for IV antibiotics.  Her hospitalization was complicated by the development of worsening hyponatremia and reached a nadir of 124 on 10/08/22.  She was given 500 mL of IV NS and improved to 126, however it dropped again to 122 which prompted our consultation to further evaluate and manage hyponatremia.  The trend in serum sodium is seen below.  Uosm 714, UNa 18 on 10/08/22.  Repeat UNa is <10 today.  She was also on HCTZ but this was last given on 10/07/22 and held since.  She denies any N/V/D, lower extremity edema, SOB, orthopnea, or PND.  Her daughter is at the bedside and reports that her AMS happened acutely on Tuesday but has since resolved.  Her daughter does state that she does not eat or drink much and hasn't had much to drink here.  Trend in Serum Sodium: Sodium  Date/Time Value Ref Range Status  10/09/2022 12:19 PM 122 (L) 135 - 145 mmol/L Final  10/09/2022 07:48 AM 126 (L) 135 - 145 mmol/L Final  10/09/2022 12:30 AM 126 (L) 135 - 145 mmol/L Final  10/09/2022 12:03 AM 125 (L) 135 - 145 mmol/L Final  10/08/2022 09:27 PM 124 (L) 135 - 145 mmol/L Final  10/08/2022 04:00 PM 127 (L) 135 - 145 mmol/L Final  10/08/2022 04:50 AM 130 (L) 135 - 145 mmol/L Final  10/06/2022 11:12 AM 132 (L) 135 - 145 mmol/L  Final  10/05/2022 11:26 PM 132 (L) 135 - 145 mmol/L Final  09/06/2022 07:56 PM 135 135 - 145 mmol/L Final  08/04/2022 05:01 PM 136 135 - 145 mmol/L Final  01/03/2021 07:04 PM 132 (L) 135 - 145 mmol/L Final  08/29/2018 02:45 PM 126 (L) 135 - 145 mmol/L Final  03/03/2018 09:26 AM 140 134 - 144 mmol/L Final  12/05/2017 10:18 AM 136 134 - 144 mmol/L Final  05/18/2016 10:29 AM 140 135 - 146 mmol/L Final  10/16/2015 08:09 AM 138 135 - 146 mmol/L Final  04/10/2015 07:34 AM 138 135 - 145 mEq/L Final  10/16/2014 09:08 AM 136 135 - 145 mEq/L Final  04/20/2014 11:23 AM 139 135 - 145 mEq/L Final  04/03/2014 01:30 PM 134 (L) 135 - 145 mEq/L Final  03/12/2014 07:50 AM 137 135 - 145 mEq/L Final  08/21/2013 08:36 AM 136 135 - 145 mEq/L Final  03/01/2013 07:45 AM 137 135 - 145 mEq/L Final  03/24/2012 10:02 AM 139 128 - 145 mEq/L Final  01/21/2012 08:35 AM 137 135 - 145 mEq/L Final  07/26/2011 12:13 PM 140 135 - 145 mEq/L Final  09/22/2010 08:29 AM 137 135 - 145 mEq/L Final  08/10/2010 08:02 AM 138 135 - 145 mEq/L Final  08/04/2010 10:55 AM 139 135 - 145 mEq/L Final  11/11/2009 01:54 PM  136 135 - 145 mEq/L Final  05/02/2009 09:08 AM 136 135 - 145 mEq/L Final  08/30/2008 08:02 AM 138 135 - 145 mEq/L Final  01/12/2008 08:16 AM 137 135 - 145 mEq/L Final  06/21/2007 08:12 AM 137 135 - 145 mEq/L Final  02/20/2007 10:25 AM 137  Final  12/16/2006 01:39 PM 139 135 - 145 mEq/L Final  08/22/2006 08:05 AM 138 135 - 145 mEq/L Final  05/13/2006 11:28 AM 137 135 - 145 mEq/L Final  01/06/2006 08:52 AM 139 135 - 145 mEq/L Final    PMH:   Past Medical History:  Diagnosis Date   Anxiety    Colon cancer ([place])    Without evidence of recurrence   Diabetes mellitus    Type 2   Ear infection    Essential hypertension    Hemorrhoids    Internal-external small hemorrhoids   Hypercholesterolemia    Portal vein thrombosis 01/29/2020    PSH:   Past Surgical History:  Procedure Laterality Date    ESOPHAGOGASTRODUODENOSCOPY   08/04/2010   VAGINAL HYSTERECTOMY  1979    Allergies:  Allergies  Allergen Reactions   Meperidine And Related Nausea And Vomiting   Other Rash and Hives   Amoxicillin-Pot Clavulanate Other (See Comments)    Patient reports it caused a "sore throat"   Contrast Media [Iodinated Contrast Media]     Pt can not have IV dye under any circumstances   Iohexol     Pt had reaction (hives) even with pre-meds, so patient can not have IV dye for any CT    Morphine And Related     Medications:   Prior to Admission medications   Medication Sig Start Date End Date Taking? Authorizing Provider  alendronate (FOSAMAX) 70 MG tablet Take 70 mg by mouth once a week. Friday 03/11/22  Yes [provider]  ALPRAZolam Duanne Moron) 0.25 MG tablet Take 1 tablet (0.25 mg total) by mouth at bedtime as needed for anxiety. 10/23/15  Yes Darlin Coco, MD  amLODipine (NORVASC) 5 MG tablet Take 1 tablet (5 mg total) by mouth daily. NEED APPOINTMENT 08/16/22  Yes Skeet Latch, MD  aspirin EC 81 MG tablet Take 81 mg by mouth daily. 09/16/22  Yes [provider]  cephALEXin (KEFLEX) 500 MG capsule Take 1 capsule (500 mg total) by mouth 3 (three) times daily. 10/06/22  Yes Delo, Nathaneil Canary, MD  Cholecalciferol (VITAMIN D PO) Take 1 tablet by mouth daily.    Yes [provider]  loperamide (IMODIUM A-D) 2 MG tablet Take 1 tablet (2 mg total) by mouth 4 (four) times daily as needed for diarrhea or loose stools. 01/03/21  Yes Fredia Sorrow, MD  losartan-hydrochlorothiazide (HYZAAR) 100-12.5 MG tablet Take 1 tablet by mouth daily. Must keep appointment for further refills. 03/29/21  Yes Loel Dubonnet, NP  metFORMIN (GLUCOPHAGE) 500 MG tablet Take 1,000 mg by mouth See admin instructions. Taking 1000 mg in the AM and 500 mg in the evening   Yes [provider]  nadolol (CORGARD) 40 MG tablet Take 120 mg by mouth daily. 3 tabs daily   Yes [provider]   omeprazole (PRILOSEC) 20 MG capsule Take 20 mg by mouth daily as needed (GERD). 07/22/22  Yes [provider]  ondansetron (ZOFRAN ODT) 4 MG disintegrating tablet Take 1 tablet (4 mg total) by mouth every 8 (eight) hours as needed for nausea or vomiting. 01/03/21  Yes Fredia Sorrow, MD  ondansetron (ZOFRAN) 8 MG tablet Take 8 mg  by mouth every 8 (eight) hours as needed for nausea or vomiting.   Yes [provider]  rosuvastatin (CRESTOR) 10 MG tablet Take 10 mg by mouth daily. 09/19/22  Yes [provider]    Discontinued Meds:   Medications Discontinued During This Encounter  Medication Reason   apixaban (ELIQUIS) 5 MG TABS tablet Discontinued by provider   benzonatate (TESSALON) 100 MG capsule Completed Course   rosuvastatin (CRESTOR) 5 MG tablet Dose change   losartan-hydrochlorothiazide (HYZAAR) 100-12.5 MG per tablet 1 tablet    hydrochlorothiazide (HYDRODIURIL) tablet 12.5 mg    lactulose (CHRONULAC) 10 GM/15ML solution 10 g    sodium chloride 0.9 % bolus 500 mL    lactulose (CHRONULAC) 10 GM/15ML solution 10 g     Social History:  reports that she has never smoked. She has never used smokeless tobacco. She reports that she does not drink alcohol and does not use drugs.  Family History:   Family History  Problem Relation Age of Onset   Hypertension Mother    Cancer Mother    Diabetes Mother     Pertinent items are noted in HPI.  Blood pressure 123/65, pulse 67, temperature 97.8 F (36.6 C), temperature source Oral, resp. rate 18, weight 68.8 kg, SpO2 100 %. General appearance: alert, cooperative, and no distress Head: Normocephalic, without obvious abnormality, atraumatic Resp: dry crackles throughout lung fields Cardio: regular rate and rhythm, S1, S2 normal, no murmur, click, rub or gallop GI: soft, non-tender; bowel sounds normal; no masses,  no organomegaly Extremities: extremities normal, atraumatic, no cyanosis or edema  Labs: Basic  Metabolic Panel: Recent Labs  Lab 10/05/22 2326 10/06/22 1112 10/08/22 0450 10/08/22 1600 10/08/22 2127 10/09/22 0003 10/09/22 0030 10/09/22 0748 10/09/22 1219  NA 132* 132* 130* 127* 124* 125* 126* 126* 122*  K 3.4* 3.4* 3.9 4.1  --   --  4.4  --   --   CL 98 100 98 98  --   --  97*  --   --   CO2 20* 21* 19* 20*  --   --  23  --   --   GLUCOSE 176* 267* 159* 193*  --   --  173*  --   --   BUN '20 18 17 17  '$ --   --  17  --   --   CREATININE 0.58 0.49 0.53 0.53  --   --  0.53  --   --   ALBUMIN 3.1* 3.2*  --   --   --   --   --   --   --   CALCIUM 8.9 8.4* 8.2* 7.8*  --   --  7.8*  --   --    Liver Function Tests: Recent Labs  Lab 10/05/22 2326 10/06/22 1112  AST 29 31  ALT 27 26  ALKPHOS 55 53  BILITOT 2.1* 2.0*  PROT 6.8 6.5  ALBUMIN 3.1* 3.2*   No results for input(s): "LIPASE", "AMYLASE" in the last 168 hours. Recent Labs  Lab 10/05/22 2326  AMMONIA 51*   CBC: Recent Labs  Lab 10/05/22 2326 10/06/22 1112 10/07/22 0852 10/08/22 0450  WBC 8.1 8.3 7.5 5.7  NEUTROABS 6.7 6.8  --  4.2  HGB 10.1* 9.5* 10.0* 9.4*  HCT 30.2* 28.5* 30.7* 28.4*  MCV 85.8 85.3 87.0 85.3  PLT 100* 78* 102* 99*   PT/INR: '@labrcntip'$ (inr:5) Cardiac Enzymes: No results for input(s): "CKTOTAL", "CKMB", "CKMBINDEX", "TROPONINI" in the last 168 hours.  CBG: Recent Labs  Lab 10/08/22 1145 10/08/22 1643 10/08/22 2331 10/09/22 0720 10/09/22 1130  GLUCAP 244* 175* 179* 157* 196*    Iron Studies: No results for input(s): "IRON", "TIBC", "TRANSFERRIN", "FERRITIN" in the last 168 hours.  Xrays/Other Studies: No results found.   Assessment/Plan:  Hyponatremia - Uosm 714, UNa 18, Sosm not sent.  She was on HCTZ but this was discontinued and last given on 10/07/22.  Repeat UNa <10 consistent with hypovolemic hyponatremia and agree with resuming IV NS but would check sodium levels every 4 hours.  If sodium continues to fall would need transfer to ICU for 3% saline.  Would not use  tolvaptan given her history of liver disease.  Will check TSH and am cortisol for completeness.   UTI - unfortunately no urine culture was sent at time of admission.  Has been responding to antibiotics.  Currently on Keflex. Dry crackles on lung exam - she denies any respiratory symptoms.  Will check CXR PA and lateral.  No history of pulmonary fibrosis.  Will need to watch carefully once IVF's started.  Acute metabolic encephalopathy - multifactorial with SAH, UTI, and elevated ammonia with h/o cirrhosis.  Seems to have improved with abx and lactulose. DM type 2 - per primary svc HTN - stable off of Hyzaar. SAH- stable by CT scan on admission.  Cirrhosis - due to oxaliplatin chemotherapy with portal HTN and portal vein thrombosis.  back on home lactulose dose but off of Eliquis per primary svc. H/o Colon cancer - in 2005   [place] 10/09/2022, 2:14 PM

## 2022-10-10 DIAGNOSIS — K766 Portal hypertension: Secondary | ICD-10-CM | POA: Diagnosis not present

## 2022-10-10 DIAGNOSIS — I10 Essential (primary) hypertension: Secondary | ICD-10-CM | POA: Diagnosis not present

## 2022-10-10 DIAGNOSIS — N390 Urinary tract infection, site not specified: Secondary | ICD-10-CM | POA: Diagnosis not present

## 2022-10-10 DIAGNOSIS — G9341 Metabolic encephalopathy: Secondary | ICD-10-CM | POA: Diagnosis not present

## 2022-10-10 LAB — GLUCOSE, CAPILLARY
Glucose-Capillary: 149 mg/dL — ABNORMAL HIGH (ref 70–99)
Glucose-Capillary: 175 mg/dL — ABNORMAL HIGH (ref 70–99)
Glucose-Capillary: 224 mg/dL — ABNORMAL HIGH (ref 70–99)
Glucose-Capillary: 252 mg/dL — ABNORMAL HIGH (ref 70–99)

## 2022-10-10 LAB — BASIC METABOLIC PANEL
Anion gap: 5 (ref 5–15)
BUN: 14 mg/dL (ref 8–23)
CO2: 24 mmol/L (ref 22–32)
Calcium: 8 mg/dL — ABNORMAL LOW (ref 8.9–10.3)
Chloride: 100 mmol/L (ref 98–111)
Creatinine, Ser: 0.45 mg/dL (ref 0.44–1.00)
GFR, Estimated: >60 mL/min (ref >60)
Glucose, Bld: 153 mg/dL — ABNORMAL HIGH (ref 70–99)
Potassium: 4.1 mmol/L (ref 3.5–5.1)
Sodium: 129 mmol/L — ABNORMAL LOW (ref 135–145)

## 2022-10-10 LAB — SODIUM
Sodium: 128 mmol/L — ABNORMAL LOW (ref 135–145)
Sodium: 126 mmol/L — ABNORMAL LOW (ref 135–145)
Sodium: 126 mmol/L — ABNORMAL LOW (ref 135–145)

## 2022-10-10 LAB — CORTISOL-AM, BLOOD: Cortisol - AM: 4.9 ug/dL — ABNORMAL LOW (ref 6.7–22.6)

## 2022-10-10 LAB — OSMOLALITY: Osmolality: 282 mOsm/kg (ref 275–295)

## 2022-10-10 LAB — OSMOLALITY, URINE: Osmolality, Ur: 380 mOsm/kg (ref 300–900)

## 2022-10-10 MED ORDER — LACTULOSE 10 GM/15ML PO SOLN: 10.0000 g | Freq: Every day | ORAL | 0 refills | Status: AC

## 2022-10-10 MED ORDER — CEFADROXIL 500 MG PO CAPS: 500.0000 mg | ORAL_CAPSULE | Freq: Two times a day (BID) | ORAL | 0 refills | Status: AC

## 2022-10-10 MED ORDER — LOSARTAN POTASSIUM 100 MG PO TABS: 100.0000 mg | ORAL_TABLET | Freq: Every day | ORAL | 2 refills | Status: DC

## 2022-10-10 MED ORDER — SODIUM CHLORIDE 1 G PO TABS
1.0000 g | ORAL_TABLET | Freq: Two times a day (BID) | ORAL | Status: DC
  Administered 2022-10-10 – 2022-10-11 (×2): 1 g via ORAL
  Filled 2022-10-10 (×2): qty 1

## 2022-10-10 NOTE — Plan of Care (Signed)

## 2022-10-10 NOTE — Progress Notes (Signed)
[facility] Progress Note   Assessment/ Plan:    Hyponatremia - Uosm 714, UNa 18, Sosm not sent.  She was on HCTZ but this was discontinued and last given on 10/07/22.  Repeat UNa <10 consistent with hypovolemic hyponatremia  - improved with IVFs  - can d/c now  - would not go back on HCTZ  - OK from renal standpoint for d/c and close followup UTI - unfortunately no urine culture was sent at time of admission.  Has been responding to antibiotics.  Currently on Keflex. Dry crackles on lung exam  - CXR relatively unchanged on my read from previous one in [place]  Acute metabolic encephalopathy - multifactorial with SAH, UTI, and elevated ammonia with h/o cirrhosis.  Seems to have improved with abx and lactulose. DM type 2 - per primary svc HTN - stable off of Hyzaar. SAH- stable by CT scan on admission.  Cirrhosis - due to oxaliplatin chemotherapy with portal HTN and portal vein thrombosis.  back on home lactulose dose but off of Eliquis per primary svc. H/o Colon cancer - in 2005  Subjective:    Seen in room.  Sitting up, eating lunch.  Na improved with IVFs.   Objective:   BP (!) 140/60 (BP Location: Right Arm)   Pulse 68   Temp 98.2 F (36.8 C) (Oral)   Resp 18   Ht '5\' 2"'$  (1.575 m)   Wt 68.8 kg   SpO2 100%   BMI 27.74 kg/m   Intake/Output Summary (Last 24 hours) at 10/10/2022 1335 Last data filed at 10/10/2022 2841 Gross per 24 hour  Intake 1260 ml  Output --  Net 1260 ml   Weight change:   Physical Exam: Gen:NAD CVS RRR Resp:normal WOB, few crackles that resolve with deep breathing Abd: soft Ext: no LE edema  Imaging: DG Chest 2 View  Result Date: 10/09/2022 CLINICAL DATA:  Cough. EXAM: CHEST - 2 VIEW COMPARISON:  07/02/2022 FINDINGS: Cardiopericardial silhouette is at upper limits of normal for size. Diffuse interstitial opacity with alveolar/nodular airspace disease in the mid and lower lungs. Tiny bilateral pleural effusions. The visualized bony  structures of the thorax are unremarkable. IMPRESSION: Diffuse interstitial opacity with alveolar/nodular airspace disease in the mid and lower lungs. Imaging features suggest pulmonary edema although multifocal pneumonia could have this appearance. Chest CT may prove helpful to further evaluate. Electronically Signed   By: Misty Stanley M.D.   On: 10/09/2022 17:12    Labs: BMET Recent Labs  Lab 10/05/22 2326 10/06/22 1112 10/08/22 0450 10/08/22 1600 10/08/22 2127 10/09/22 0003 10/09/22 0030 10/09/22 0748 10/09/22 1219 10/09/22 1707 10/10/22 0246 10/10/22 0917  NA 132* 132* 130* 127*   < > 125* 126* 126* 122* 126* 129* 128*  K 3.4* 3.4* 3.9 4.1  --   --  4.4  --   --   --  4.1  --   CL 98 100 98 98  --   --  97*  --   --   --  100  --   CO2 20* 21* 19* 20*  --   --  23  --   --   --  24  --   GLUCOSE 176* 267* 159* 193*  --   --  173*  --   --   --  153*  --   BUN '20 18 17 17  '$ --   --  17  --   --   --  14  --  CREATININE 0.58 0.49 0.53 0.53  --   --  0.53  --   --   --  0.45  --   CALCIUM 8.9 8.4* 8.2* 7.8*  --   --  7.8*  --   --   --  8.0*  --    < > = values in this interval not displayed.   CBC Recent Labs  Lab 10/05/22 2326 10/06/22 1112 10/07/22 0852 10/08/22 0450  WBC 8.1 8.3 7.5 5.7  NEUTROABS 6.7 6.8  --  4.2  HGB 10.1* 9.5* 10.0* 9.4*  HCT 30.2* 28.5* 30.7* 28.4*  MCV 85.8 85.3 87.0 85.3  PLT 100* 78* 102* 99*    Medications:     amLODipine  5 mg Oral Daily   aspirin EC  81 mg Oral Daily   insulin aspart  0-9 Units Subcutaneous TID WC   lactulose  10 g Oral Daily   losartan  100 mg Oral Daily   nadolol  120 mg Oral Daily   pantoprazole  40 mg Oral Daily   potassium chloride  40 mEq Oral Daily   rosuvastatin  10 mg Oral Daily    Madelon Lips MD 10/10/2022, 1:35 PM

## 2022-10-10 NOTE — Progress Notes (Signed)
PROGRESS NOTE    Amber Crosby  PNT:[id number] DOB: [birth year] DOA: 10/05/2022 PCP: Antony Contras, MD   Brief Narrative: Amber Crosby is a 77 y.o. female with a history of diabetes mellitus type 2, hypertension, hyperlipidemia, cirrhosis, portal vein thrombosis, anxiety, SAH. Patient presented secondary to altered mental status with evidence of urinary tract infection. Empiric antibiotics started with improvement of mental status back to baseline. Hospitalization complicated by development of acute hyponatremia.   Assessment and Plan:  Complicated UTI Urinalysis suggests infection. No urine culture obtained on admission. Empiric Ceftriaxone started. Urine culture (obtained after antibiotics) with no growth. Complicated by fever. -Continue Ceftriaxone IV x7 days  Positive blood culture 1/4 bottles significant for staphylococcus epidermidis, consistent with likely contaminant. Will not treat as patient has also clinically improved significant with treatment of UTI.  Acute metabolic/hepatic encephalopathy Likely multifactorial secondary to hepatic encephalopathy and UTI. Mental status improved from admission but still not at baseline. Patient not restarted on home lactulose. -Continue lactulose 10 gm daily  Acute hyponatremia Sodium of 132 on admission with downtrend. Possibly related to dehydration vs hydrochlorothiazide vs combination. No edema noted. Urine sodium of 18 with urine osmolality of 714. Sodium down to 126 this morning with trend this afternoon to 122. Repeat urine sodium undetectable after IV fluid challenge, consistent with dehydration. Nephrology consulted. Sodium with improvement of normal saline IV fluids overnight, now with trend down. -Nephrology recommendations: initially discharge, however with downtrend of sodium, recommending salt tablets and if continued worsening, IV fluids -Check sodium q6 hours  Generalized weakness Patient evaluated by PT with  recommendations for no PT follow-up.  Thrombocytopenia Chronic. Stable.  Diabetes mellitus, type 2 -Continue SSI  Primary hypertension -Continue amlodipine, hydrochlorothiazide and losartan  Hyperlipidemia -Continue Crestor  Hypokalemia Mild. Potassium supplementation given.  Normocytic anemia Stable.  Anxiety Noted.  Subarachnoid hemorrhage Secondary to history of fall. Chronic issue.  Cirrhosis Portal vein thrombosis Portal hypertension Patient follows with [facility] hepatology. Chronic hyperbilirubinemia. Previously on Eliquis which was held after subarachnoid hemorrhage -Continue nadolol  DVT prophylaxis: SCDs Code Status:   Code Status: Full Code Family Communication: Daughter at bedside. Daughter on telephone. Disposition Plan: Discharge possible in 1 day pending improvement of sodium   Consultants:  Nephrology  Procedures:  None  Antimicrobials: Ceftriaxone IV   Subjective: No concerns this morning. Feels well.  Objective: BP (!) 120/55 (BP Location: Right Arm)   Pulse 69   Temp 98 F (36.7 C) (Oral)   Resp 14   Ht '5\' 2"'$  (1.575 m)   Wt 68.8 kg   SpO2 97%   BMI 27.74 kg/m   Examination:  General exam: Appears calm and comfortable Respiratory system: Respiratory effort normal. Central nervous system: Alert and oriented. Psychiatry: Judgement and insight appear normal. Mood & affect appropriate.    Data Reviewed: I have personally reviewed following labs and imaging studies  CBC Lab Results  Component Value Date   WBC 5.7 10/08/2022   RBC 3.33 (L) 10/08/2022   HGB 9.4 (L) 10/08/2022   HCT 28.4 (L) 10/08/2022   MCV 85.3 10/08/2022   MCH 28.2 10/08/2022   PLT 99 (L) 10/08/2022   MCHC 33.1 10/08/2022   RDW 14.6 10/08/2022   LYMPHSABS 0.5 (L) 10/08/2022   MONOABS 0.8 10/08/2022   EOSABS 0.1 10/08/2022   BASOSABS 0.0 08/67/6195     Last metabolic panel Lab Results  Component Value Date   NA 126 (L) 10/10/2022   K  4.1 10/10/2022  CL 100 10/10/2022   CO2 24 10/10/2022   BUN 14 10/10/2022   CREATININE 0.45 10/10/2022   GLUCOSE 153 (H) 10/10/2022   GFRNONAA >60 10/10/2022   GFRAA >60 08/29/2018   CALCIUM 8.0 (L) 10/10/2022   PROT 6.8 10/09/2022   ALBUMIN 3.2 (L) 10/09/2022   LABGLOB 2.2 03/03/2018   AGRATIO 2.0 03/03/2018   BILITOT 2.1 (H) 10/09/2022   ALKPHOS 70 10/09/2022   AST 30 10/09/2022   ALT 23 10/09/2022   ANIONGAP 5 10/10/2022    GFR: Estimated Creatinine Clearance: 54.4 mL/min (by C-G formula based on SCr of 0.45 mg/dL).  Recent Results (from the past 240 hour(s))  Resp panel by RT-PCR (RSV, Flu A&B, Covid) Anterior Nasal Swab     Status: None   Collection Time: 10/06/22  8:35 AM   Specimen: Anterior Nasal Swab  Result Value Ref Range Status   SARS Coronavirus 2 by RT PCR NEGATIVE NEGATIVE Final    Comment: (NOTE) SARS-CoV-2 target nucleic acids are NOT DETECTED.  The SARS-CoV-2 RNA is generally detectable in upper respiratory specimens during the acute phase of infection. The lowest concentration of SARS-CoV-2 viral copies this assay can detect is 138 copies/mL. A negative result does not preclude SARS-Cov-2 infection and should not be used as the sole basis for treatment or other patient management decisions. A negative result may occur with  improper specimen collection/handling, submission of specimen other than nasopharyngeal swab, presence of viral mutation(s) within the areas targeted by this assay, and inadequate number of viral copies(<138 copies/mL). A negative result must be combined with clinical observations, patient history, and epidemiological information. The expected result is Negative.  Fact Sheet for Patients:  EntrepreneurPulse.com.au  Fact Sheet for Healthcare Providers:  IncredibleEmployment.be  This test is no t yet approved or cleared by the [place] FDA and  has been authorized for detection and/or  diagnosis of SARS-CoV-2 by FDA under an Emergency Use Authorization (EUA). This EUA will remain  in effect (meaning this test can be used) for the duration of the COVID-19 declaration under Section 564(b)(1) of the Act, 21 U.S.C.section 360bbb-3(b)(1), unless the authorization is terminated  or revoked sooner.       Influenza A by PCR NEGATIVE NEGATIVE Final   Influenza B by PCR NEGATIVE NEGATIVE Final    Comment: (NOTE) The Xpert Xpress SARS-CoV-2/FLU/RSV plus assay is intended as an aid in the diagnosis of influenza from Nasopharyngeal swab specimens and should not be used as a sole basis for treatment. Nasal washings and aspirates are unacceptable for Xpert Xpress SARS-CoV-2/FLU/RSV testing.  Fact Sheet for Patients: EntrepreneurPulse.com.au  Fact Sheet for Healthcare Providers: IncredibleEmployment.be  This test is not yet approved or cleared by the [place] FDA and has been authorized for detection and/or diagnosis of SARS-CoV-2 by FDA under an Emergency Use Authorization (EUA). This EUA will remain in effect (meaning this test can be used) for the duration of the COVID-19 declaration under Section 564(b)(1) of the Act, 21 U.S.C. section 360bbb-3(b)(1), unless the authorization is terminated or revoked.     Resp Syncytial Virus by PCR NEGATIVE NEGATIVE Final    Comment: (NOTE) Fact Sheet for Patients: EntrepreneurPulse.com.au  Fact Sheet for Healthcare Providers: IncredibleEmployment.be  This test is not yet approved or cleared by the [place] FDA and has been authorized for detection and/or diagnosis of SARS-CoV-2 by FDA under an Emergency Use Authorization (EUA). This EUA will remain in effect (meaning this test can be used) for the duration of the COVID-19  declaration under Section 564(b)(1) of the Act, 21 U.S.C. section 360bbb-3(b)(1), unless the authorization is terminated  or revoked.  Performed at [facility], [place] [address], [place], [place] [address]   Urine Culture     Status: None   Collection Time: 10/07/22  8:19 AM   Specimen: In/Out Cath Urine  Result Value Ref Range Status   Specimen Description   Final    IN/OUT CATH URINE Performed at [facility] [address], [place], [place] [address]    Special Requests   Final    NONE Performed at [facility], [place] [address] [place], [place] [address]    Culture   Final    NO GROWTH Performed at [facility], [place] [address] [place], [place] [address]    Report Status 10/08/2022 FINAL  Final  Culture, blood (Routine X 2) w Reflex to ID Panel     Status: None (Preliminary result)   Collection Time: 10/07/22  2:43 PM   Specimen: BLOOD  Result Value Ref Range Status   Specimen Description   Final    BLOOD BLOOD LEFT HAND Performed at [facility] [address] [place], [place] [address]    Special Requests   Final    BOTTLES DRAWN AEROBIC AND ANAEROBIC Blood Culture adequate volume Performed at [facility] [address], [place], [place] [address]    Culture  Setup Time   Final    GRAM POSITIVE COCCI ANAEROBIC BOTTLE ONLY Organism ID to follow CRITICAL RESULT CALLED TO, READ BACK BY AND VERIFIED WITH: L POINDEXTER,PHARMD'@0410'$  10/09/22 [place] Performed at [facility], [place] [address], [place], [place] [address]    Culture GRAM POSITIVE COCCI  Final   Report Status PENDING  Incomplete  Blood Culture ID Panel (Reflexed)     Status: Abnormal   Collection Time: 10/07/22  2:43 PM  Result Value Ref Range Status   Enterococcus faecalis NOT DETECTED NOT DETECTED Final   Enterococcus Faecium NOT DETECTED NOT DETECTED Final   Listeria monocytogenes NOT DETECTED NOT DETECTED Final   Staphylococcus species DETECTED (A) NOT DETECTED Final    Comment: CRITICAL RESULT CALLED TO, READ BACK BY  AND VERIFIED WITH: L POINDEXTER,PHARMD'@0411'$  10/09/22 [place]    Staphylococcus aureus (BCID) NOT DETECTED NOT DETECTED Final   Staphylococcus epidermidis DETECTED (A) NOT DETECTED Final    Comment: Methicillin (oxacillin) resistant coagulase negative staphylococcus. Possible blood culture contaminant (unless isolated from more than one blood culture draw or clinical case suggests pathogenicity). No antibiotic treatment is indicated for blood  culture contaminants. CRITICAL RESULT CALLED TO, READ BACK BY AND VERIFIED WITH: L POINDEXTER,PHARMD'@0411'$  10/09/22 [place]    Staphylococcus lugdunensis NOT DETECTED NOT DETECTED Final   Streptococcus species NOT DETECTED NOT DETECTED Final   Streptococcus agalactiae NOT DETECTED NOT DETECTED Final   Streptococcus pneumoniae NOT DETECTED NOT DETECTED Final   Streptococcus pyogenes NOT DETECTED NOT DETECTED Final   A.calcoaceticus-baumannii NOT DETECTED NOT DETECTED Final   Bacteroides fragilis NOT DETECTED NOT DETECTED Final   Enterobacterales NOT DETECTED NOT DETECTED Final   Enterobacter cloacae complex NOT DETECTED NOT DETECTED Final   Escherichia coli NOT DETECTED NOT DETECTED Final   Klebsiella aerogenes NOT DETECTED NOT DETECTED Final   Klebsiella oxytoca NOT DETECTED NOT DETECTED Final   Klebsiella pneumoniae NOT DETECTED NOT DETECTED Final   Proteus species NOT DETECTED NOT DETECTED Final   Salmonella species NOT DETECTED NOT DETECTED Final  Serratia marcescens NOT DETECTED NOT DETECTED Final   Haemophilus influenzae NOT DETECTED NOT DETECTED Final   Neisseria meningitidis NOT DETECTED NOT DETECTED Final   Pseudomonas aeruginosa NOT DETECTED NOT DETECTED Final   Stenotrophomonas maltophilia NOT DETECTED NOT DETECTED Final   Candida albicans NOT DETECTED NOT DETECTED Final   Candida auris NOT DETECTED NOT DETECTED Final   Candida glabrata NOT DETECTED NOT DETECTED Final   Candida krusei NOT DETECTED NOT DETECTED Final   Candida parapsilosis NOT  DETECTED NOT DETECTED Final   Candida tropicalis NOT DETECTED NOT DETECTED Final   Cryptococcus neoformans/gattii NOT DETECTED NOT DETECTED Final   Methicillin resistance mecA/C DETECTED (A) NOT DETECTED Final    Comment: CRITICAL RESULT CALLED TO, READ BACK BY AND VERIFIED WITH: L POINDEXTER,PHARMD'@0411'$  10/09/22 [place] Performed at [facility], [place] [address], [place], [place] [address]   Culture, blood (Routine X 2) w Reflex to ID Panel     Status: None (Preliminary result)   Collection Time: 10/07/22  2:49 PM   Specimen: BLOOD  Result Value Ref Range Status   Specimen Description   Final    BLOOD BLOOD RIGHT HAND Performed at [facility] [address] [place], [place] [address]    Special Requests   Final    BOTTLES DRAWN AEROBIC AND ANAEROBIC Blood Culture adequate volume Performed at [facility] [address], [place], [place] [address]    Culture   Final    NO GROWTH 3 DAYS Performed at [facility], [place] [address], [place], [place] [address]    Report Status PENDING  Incomplete      Radiology Studies: DG Chest 2 View  Result Date: 10/09/2022 CLINICAL DATA:  Cough. EXAM: CHEST - 2 VIEW COMPARISON:  07/02/2022 FINDINGS: Cardiopericardial silhouette is at upper limits of normal for size. Diffuse interstitial opacity with alveolar/nodular airspace disease in the mid and lower lungs. Tiny bilateral pleural effusions. The visualized bony structures of the thorax are unremarkable. IMPRESSION: Diffuse interstitial opacity with alveolar/nodular airspace disease in the mid and lower lungs. Imaging features suggest pulmonary edema although multifocal pneumonia could have this appearance. Chest CT may prove helpful to further evaluate. Electronically Signed   By: Misty Stanley M.D.   On: 10/09/2022 17:12      LOS: 4 days    Cordelia Poche, MD [facility] 10/10/2022, 10:00 AM   If 7PM-7AM, please contact  night-coverage [email or web address]

## 2022-10-10 NOTE — Plan of Care (Signed)
  Problem: Education: Goal: Ability to describe self-care measures that may prevent or decrease complications (Diabetes Survival Skills Education) will improve Outcome: Adequate for Discharge Goal: Individualized Educational Video(s) Outcome: Adequate for Discharge   Problem: Coping: Goal: Ability to adjust to condition or change in health will improve Outcome: Adequate for Discharge   Problem: Fluid Volume: Goal: Ability to maintain a balanced intake and output will improve Outcome: Adequate for Discharge   Problem: Health Behavior/Discharge Planning: Goal: Ability to identify and utilize available resources and services will improve Outcome: Adequate for Discharge Goal: Ability to manage health-related needs will improve Outcome: Adequate for Discharge   Problem: Metabolic: Goal: Ability to maintain appropriate glucose levels will improve Outcome: Adequate for Discharge   Problem: Nutritional: Goal: Maintenance of adequate nutrition will improve Outcome: Adequate for Discharge Goal: Progress toward achieving an optimal weight will improve Outcome: Adequate for Discharge   Problem: Skin Integrity: Goal: Risk for impaired skin integrity will decrease Outcome: Adequate for Discharge   Problem: Tissue Perfusion: Goal: Adequacy of tissue perfusion will improve Outcome: Adequate for Discharge   Problem: Education: Goal: Knowledge of General Education information will improve Description: Including pain rating scale, medication(s)/side effects and non-pharmacologic comfort measures Outcome: Adequate for Discharge   Problem: Health Behavior/Discharge Planning: Goal: Ability to manage health-related needs will improve Outcome: Adequate for Discharge   Problem: Clinical Measurements: Goal: Ability to maintain clinical measurements within normal limits will improve Outcome: Adequate for Discharge Goal: Will remain free from infection Outcome: Adequate for Discharge Goal:  Diagnostic test results will improve Outcome: Adequate for Discharge Goal: Respiratory complications will improve Outcome: Adequate for Discharge Goal: Cardiovascular complication will be avoided Outcome: Adequate for Discharge   Problem: Activity: Goal: Risk for activity intolerance will decrease Outcome: Adequate for Discharge   Problem: Nutrition: Goal: Adequate nutrition will be maintained Outcome: Adequate for Discharge   Problem: Coping: Goal: Level of anxiety will decrease Outcome: Adequate for Discharge   Problem: Elimination: Goal: Will not experience complications related to bowel motility Outcome: Adequate for Discharge Goal: Will not experience complications related to urinary retention Outcome: Adequate for Discharge   Problem: Pain Managment: Goal: General experience of comfort will improve Outcome: Adequate for Discharge   Problem: Safety: Goal: Ability to remain free from injury will improve Outcome: Adequate for Discharge   Problem: Skin Integrity: Goal: Risk for impaired skin integrity will decrease Outcome: Adequate for Discharge   Problem: Acute Rehab PT Goals(only PT should resolve) Goal: Pt Will Ambulate Outcome: Adequate for Discharge Goal: Pt Will Go Up/Down Stairs Outcome: Adequate for Discharge Goal: Pt/caregiver will Perform Home Exercise Program Outcome: Adequate for Discharge

## 2022-10-11 DIAGNOSIS — N390 Urinary tract infection, site not specified: Secondary | ICD-10-CM | POA: Diagnosis not present

## 2022-10-11 DIAGNOSIS — G9341 Metabolic encephalopathy: Secondary | ICD-10-CM | POA: Diagnosis not present

## 2022-10-11 DIAGNOSIS — E871 Hypo-osmolality and hyponatremia: Secondary | ICD-10-CM | POA: Diagnosis not present

## 2022-10-11 LAB — CULTURE, BLOOD (ROUTINE X 2): Special Requests: ADEQUATE

## 2022-10-11 LAB — GLUCOSE, CAPILLARY: Glucose-Capillary: 169 mg/dL — ABNORMAL HIGH (ref 70–99)

## 2022-10-11 LAB — SODIUM
Sodium: 127 mmol/L — ABNORMAL LOW (ref 135–145)
Sodium: 129 mmol/L — ABNORMAL LOW (ref 135–145)

## 2022-10-11 MED ORDER — SODIUM CHLORIDE 1 G PO TABS: 1.0000 g | ORAL_TABLET | Freq: Three times a day (TID) | ORAL | Status: DC

## 2022-10-11 MED ORDER — SODIUM CHLORIDE 1 G PO TABS: 1.0000 g | ORAL_TABLET | Freq: Three times a day (TID) | ORAL | 0 refills | Status: AC

## 2022-10-11 NOTE — Progress Notes (Signed)
Discharge instructions given to patient and all questions were answered.  

## 2022-10-11 NOTE — Progress Notes (Addendum)
[facility] Progress Note   Assessment/ Plan:    Hyponatremia - Uosm 714, UNa 18, Sosm not sent.  She was on HCTZ but this was discontinued and last given on 10/07/22.  Repeat UNa <10 consistent with hypovolemic hyponatremia vs cirrhosis physiology vs low solute (eats very little at home) vs combination.  Improved with IVFs yest to 129 and is 127 today. Pt is asymptomatic. OK for dc.   - cont to avoid thiazide diuretics/ HCTZ  - will give low dose salt tabs 1 gm tid for 4 days  - pt / family know to f/u w/ her PCP for labs this week  - pt has cirrhosis MD f/u as well in the next 1-2 wks  - does not need renal f/u at this time  - will sign off UTI - unfortunately no urine culture was sent at time of admission.  Has been responding to antibiotics.  Currently on Keflex. Acute metabolic encephalopathy - multifactorial with SAH, UTI, and elevated ammonia with h/o cirrhosis.  Seems to have improved with abx and lactulose. DM type 2 - per primary svc HTN - BP stable on losartan, nadolol and norvasc SAH- stable by CT scan on admission.  Cirrhosis - due to oxaliplatin chemotherapy with portal HTN and portal vein thrombosis.  back on home lactulose dose but off of Eliquis per primary svc. H/o Colon cancer - in 2005  Kelly Splinter, MD 10/11/2022, 8:46 AM  Recent Labs  Lab 10/06/22 1112 10/07/22 0852 10/08/22 0450 10/08/22 1600 10/09/22 0030 10/09/22 1707 10/10/22 0246  HGB 9.5* 10.0* 9.4*  --   --   --   --   ALBUMIN 3.2*  --   --   --   --  3.2*  --   CALCIUM 8.4*  --  8.2*   < > 7.8*  --  8.0*  CREATININE 0.49  --  0.53   < > 0.53  --  0.45  K 3.4*  --  3.9   < > 4.4  --  4.1   < > = values in this interval not displayed.    Inpatient medications:  amLODipine  5 mg Oral Daily   aspirin EC  81 mg Oral Daily   insulin aspart  0-9 Units Subcutaneous TID WC   lactulose  10 g Oral Daily   losartan  100 mg Oral Daily   nadolol  120 mg Oral Daily   pantoprazole  40 mg Oral  Daily   potassium chloride  40 mEq Oral Daily   rosuvastatin  10 mg Oral Daily   sodium chloride  1 g Oral BID WC    cefTRIAXone (ROCEPHIN)  IV 1 g (10/11/22 0002)   acetaminophen **OR** acetaminophen, ALPRAZolam       Subjective:   Seen in room, no SOB no cough, no c/o's today    Objective:   BP 129/64 (BP Location: Right Arm)   Pulse 73   Temp 99 F (37.2 C) (Oral)   Resp 16   Ht '5\' 2"'$  (1.575 m)   Wt 68.8 kg   SpO2 98%   BMI 27.74 kg/m   Intake/Output Summary (Last 24 hours) at 10/11/2022 0835 Last data filed at 10/11/2022 0450 Gross per 24 hour  Intake 1777.5 ml  Output --  Net 1777.5 ml    Weight change:   Physical Exam: Gen:NAD CVS RRR Resp:normal WOB, few crackles that resolve with deep breathing Abd: soft Ext: no LE edema

## 2022-10-11 NOTE — Discharge Summary (Signed)
Physician Discharge Summary   Patient: Amber Crosby MRN: [id number] DOB: 02-14-1946  Admit date:     10/05/2022  Discharge date: 10/11/22  Discharge Physician: Cordelia Poche, MD   PCP: Antony Contras, MD   Recommendations at discharge:  PCP follow-up  Discharge Diagnoses: Principal Problem:   UTI (urinary tract infection) Active Problems:   DM2 (diabetes mellitus, type 2) ([place])   Pure hypercholesterolemia   Portal hypertension with esophageal varices ([place])   Portal vein thrombosis   HTN (hypertension)   Hyperlipemia   Intracranial hemorrhage (HCC)   Normocytic anemia   Thrombocytopenia (HCC)   Hyperbilirubinemia   Hypokalemia   Generalized weakness   Acute metabolic encephalopathy   Acute hyponatremia  Resolved Problems:   * No resolved hospital problems. *  Hospital Course: Amber Crosby is a 77 y.o. female with a history of diabetes mellitus type 2, hypertension, hyperlipidemia, cirrhosis, portal vein thrombosis, anxiety, SAH. Patient presented secondary to altered mental status with evidence of urinary tract infection. Empiric antibiotics started with improvement of mental status back to baseline. Hospitalization complicated by development of acute hyponatremia. Hyponatremia likely secondary to dehydration/low solute. Nephrology consulted secondary to persistent hyponatremia. Patient improved with IV fluids and initiation of sodium chloride tablets. Recommendation to continue tablets on discharge and obtain repeat labwork this week. Patient to follow-up with outpatient physicians.  Assessment and Plan:  Complicated UTI Urinalysis suggests infection. No urine culture obtained on admission. Empiric Ceftriaxone started. Urine culture (obtained after antibiotics) with no growth. Complicated by fever. Patient managed with Ceftriaxone and transitioned to cefadroxil on discharge to complete a 7 day course of antibiotics.   Positive blood culture 1/4 bottles significant for  staphylococcus epidermidis, consistent with likely contaminant. Will not treat as patient has also clinically improved significant with treatment of UTI.   Acute metabolic/hepatic encephalopathy Likely multifactorial secondary to hepatic encephalopathy and UTI. Mental status improved and patient returned to baseline.   Acute hyponatremia Sodium of 132 on admission with downtrend. Possibly related to dehydration vs hydrochlorothiazide vs combination. No edema noted. Urine sodium of 18 with urine osmolality of 714. Sodium down to 126 this morning with trend this afternoon to 122. Repeat urine sodium undetectable after IV fluid challenge, consistent with dehydration. Nephrology consulted for persistent hyponatremia. Overall, sodium improved with IV fluids and sodium chloride tablets. Patient to follow-up with outpatient physicians and obtain a repeat BMP.   Generalized weakness Patient evaluated by PT with recommendations for no PT follow-up.   Thrombocytopenia Chronic. Stable.   Diabetes mellitus, type 2 SSI while inpatient.   Primary hypertension Continue amlodipine and losartan. Discontinue hydrochlorothiazide on discharge.   Hyperlipidemia Continue Crestor   Hypokalemia Mild. Potassium supplementation given.   Normocytic anemia Stable.   Anxiety Noted.   Subarachnoid hemorrhage Secondary to history of fall. Chronic issue.   Cirrhosis Portal vein thrombosis Portal hypertension Patient follows with [facility] hepatology. Chronic hyperbilirubinemia. Previously on Eliquis which was held after subarachnoid hemorrhage. Continue nadolol.  Consultants: Nephrology Procedures performed: None  Disposition: Home Diet recommendation: Carb modified diet   DISCHARGE MEDICATION: Allergies as of 10/11/2022       Reactions   Meperidine And Related Nausea And Vomiting   Other Rash, Hives   Amoxicillin-pot Clavulanate Other (See Comments)   Patient reports it caused a "sore  throat"   Contrast Media [iodinated Contrast Media]    Pt can not have IV dye under any circumstances   Iohexol    Pt had reaction (hives)  even with pre-meds, so patient can not have IV dye for any CT   Morphine And Related         Medication List     STOP taking these medications    losartan-hydrochlorothiazide 100-12.5 MG tablet Commonly known as: HYZAAR       TAKE these medications    alendronate 70 MG tablet Commonly known as: FOSAMAX Take 70 mg by mouth once a week. Friday   ALPRAZolam 0.25 MG tablet Commonly known as: XANAX Take 1 tablet (0.25 mg total) by mouth at bedtime as needed for anxiety.   amLODipine 5 MG tablet Commonly known as: NORVASC Take 1 tablet (5 mg total) by mouth daily. NEED APPOINTMENT   aspirin EC 81 MG tablet Take 81 mg by mouth daily.   cefadroxil 500 MG capsule Commonly known as: DURICEF Take 1 capsule (500 mg total) by mouth 2 (two) times daily for 2 days.   lactulose 10 GM/15ML solution Commonly known as: CHRONULAC Take 15 mLs (10 g total) by mouth daily.   loperamide 2 MG tablet Commonly known as: Imodium A-D Take 1 tablet (2 mg total) by mouth 4 (four) times daily as needed for diarrhea or loose stools.   losartan 100 MG tablet Commonly known as: COZAAR Take 1 tablet (100 mg total) by mouth daily.   metFORMIN 500 MG tablet Commonly known as: GLUCOPHAGE Take 1,000 mg by mouth See admin instructions. Taking 1000 mg in the AM and 500 mg in the evening   nadolol 40 MG tablet Commonly known as: CORGARD Take 120 mg by mouth daily. 3 tabs daily   omeprazole 20 MG capsule Commonly known as: PRILOSEC Take 20 mg by mouth daily as needed (GERD).   ondansetron 4 MG disintegrating tablet Commonly known as: Zofran ODT Take 1 tablet (4 mg total) by mouth every 8 (eight) hours as needed for nausea or vomiting.   ondansetron 8 MG tablet Commonly known as: ZOFRAN Take 8 mg by mouth every 8 (eight) hours as needed for nausea or  vomiting.   rosuvastatin 10 MG tablet Commonly known as: CRESTOR Take 10 mg by mouth daily.   sodium chloride 1 g tablet Take 1 tablet (1 g total) by mouth 3 (three) times daily with meals for 4 days.   VITAMIN D PO Take 1 tablet by mouth daily.        Follow-up Information     [facility] .   Specialty: Emergency Medicine Why: As needed, If symptoms worsen Contact information: [place] [id number] [place]        Antony Contras, MD. Schedule an appointment as soon as possible for a visit in 3 day(s).   Specialty: Family Medicine Why: metabolic panel Contact information: [place] [address] [phone]                Discharge Exam: BP 129/64 (BP Location: Right Arm)   Pulse 73   Temp 99 F (37.2 C) (Oral)   Resp 16   Ht '5\' 2"'$  (1.575 m)   Wt 68.9 kg   SpO2 98%   BMI 27.76 kg/m   General exam: Appears calm and comfortable Respiratory system: Respiratory effort normal. Central nervous system: Alert and oriented. Psychiatry: Judgement and insight appear normal. Mood & affect appropriate.   Condition at discharge: stable  The results of significant diagnostics from this hospitalization (including imaging, microbiology, ancillary and laboratory)  are listed below for reference.   Imaging Studies: DG Chest 2 View  Result Date: 10/09/2022 CLINICAL DATA:  Cough. EXAM: CHEST - 2 VIEW COMPARISON:  07/02/2022 FINDINGS: Cardiopericardial silhouette is at upper limits of normal for size. Diffuse interstitial opacity with alveolar/nodular airspace disease in the mid and lower lungs. Tiny bilateral pleural effusions. The visualized bony structures of the thorax are unremarkable. IMPRESSION: Diffuse interstitial opacity with alveolar/nodular airspace disease in the mid and lower lungs. Imaging features suggest pulmonary edema although  multifocal pneumonia could have this appearance. Chest CT may prove helpful to further evaluate. Electronically Signed   By: Misty Stanley M.D.   On: 10/09/2022 17:12   CT Head Wo Contrast  Result Date: 10/06/2022 CLINICAL DATA:  Hemorrhage identified on CT earlier today. EXAM: CT HEAD WITHOUT CONTRAST TECHNIQUE: Contiguous axial images were obtained from the base of the skull through the vertex without intravenous contrast. RADIATION DOSE REDUCTION: This exam was performed according to the departmental dose-optimization program which includes automated exposure control, adjustment of the mA and/or kV according to patient size and/or use of iterative reconstruction technique. COMPARISON:  10/05/2022.  09/07/2022 . FINDINGS: Brain: Stable hyperattenuation in sulci of the high left frontal region comparing to exam from earlier today but decreased comparing to 09/07/2022. No evidence for interval rebleeding since the study from earlier today. Diffuse loss of parenchymal volume is consistent with atrophy. Patchy low attenuation in the deep hemispheric and periventricular white matter is nonspecific, but likely reflects chronic microvascular ischemic demyelination. No findings to suggest acute infarct. Vascular: No hyperdense vessel or unexpected calcification. Skull: No evidence for fracture. No worrisome lytic or sclerotic lesion. Sinuses/Orbits: Chronic opacification right sphenoid sinus again noted with mucosal disease in the left sphenoid sinus and posterior ethmoid air cells. Mastoid air cells are clear bilaterally. Visualized portions of the globes and intraorbital fat are unremarkable. Other: None. IMPRESSION: 1. Stable hyperattenuation in sulci of the high left frontal region comparing to exam from earlier today but decreased comparing to 09/07/2022. No evidence for interval further bleeding since the study from earlier today. Findings are likely residual from the blood products identified on the 09/07/2022  exam although, technically, trace interval rebleeding between the 09/17/2022 exam and the CT scan from earlier today cannot be completely excluded. 2. Atrophy with chronic small vessel ischemic disease. 3. Chronic paranasal sinus disease. Electronically Signed   By: Misty Stanley M.D.   On: 10/06/2022 06:56   CT Head Wo Contrast  Addendum Date: 10/06/2022   ADDENDUM REPORT: 10/05/2022 23:23 ADDENDUM: These results were called by telephone at the time of interpretation on 10/05/2022 at 11:23 pm to provider Dr. Stark Jock, who verbally acknowledged these results. Electronically Signed   By: Iven Finn M.D.   On: 10/05/2022 23:23   Result Date: 10/05/2022 CLINICAL DATA:  Mental status change, unknown cause EXAM: CT HEAD WITHOUT CONTRAST TECHNIQUE: Contiguous axial images were obtained from the base of the skull through the vertex without intravenous contrast. RADIATION DOSE REDUCTION: This exam was performed according to the departmental dose-optimization program which includes automated exposure control, adjustment of the mA and/or kV according to patient size and/or use of iterative reconstruction technique. COMPARISON:  CT head 09/07/2022 FINDINGS: Brain: Patchy and confluent areas of decreased attenuation are noted throughout the deep and periventricular white matter of the cerebral hemispheres bilaterally, compatible with chronic microvascular ischemic disease. No evidence of large-territorial acute infarction. No parenchymal hemorrhage. No mass lesion. Trace curvilinear hyperdensity along the left frontal lobe sulci (  2:20 1-23) in the same location as prior subarachnoid hemorrhage dating 09/07/2022. No mass effect or midline shift. No hydrocephalus. Basilar cisterns are patent. Vascular: No hyperdense vessel. Skull: No acute fracture or focal lesion. Sinuses/Orbits: Complete opacification of the right sphenoid sinus with associated sphenoid wall hypertrophy consistent with chronic sinusitis. Mucosal  thickening of the left sphenoid sinus and bilateral ethmoid sinuses. Paranasal sinuses and mastoid air cells are clear. The orbits are unremarkable. Other: None. IMPRESSION: Trace curvilinear hyperdensity along the left frontal lobe sulci. Finding could represent an acute subarachnoid hemorrhage versus residual of prior bleed on 09/07/2022. Recommend repeat CT in 6 hours to evaluate for evolution/stability. Electronically Signed: By: Iven Finn M.D. On: 10/05/2022 23:18    Microbiology: Results for orders placed or performed during the hospital encounter of 10/05/22  Resp panel by RT-PCR (RSV, Flu A&B, Covid) Anterior Nasal Swab     Status: None   Collection Time: 10/06/22  8:35 AM   Specimen: Anterior Nasal Swab  Result Value Ref Range Status   SARS Coronavirus 2 by RT PCR NEGATIVE NEGATIVE Final    Comment: (NOTE) SARS-CoV-2 target nucleic acids are NOT DETECTED.  The SARS-CoV-2 RNA is generally detectable in upper respiratory specimens during the acute phase of infection. The lowest concentration of SARS-CoV-2 viral copies this assay can detect is 138 copies/mL. A negative result does not preclude SARS-Cov-2 infection and should not be used as the sole basis for treatment or other patient management decisions. A negative result may occur with  improper specimen collection/handling, submission of specimen other than nasopharyngeal swab, presence of viral mutation(s) within the areas targeted by this assay, and inadequate number of viral copies(<138 copies/mL). A negative result must be combined with clinical observations, patient history, and epidemiological information. The expected result is Negative.  Fact Sheet for Patients:  EntrepreneurPulse.com.au  Fact Sheet for Healthcare Providers:  IncredibleEmployment.be  This test is no t yet approved or cleared by the [place] FDA and  has been authorized for detection and/or diagnosis of  SARS-CoV-2 by FDA under an Emergency Use Authorization (EUA). This EUA will remain  in effect (meaning this test can be used) for the duration of the COVID-19 declaration under Section 564(b)(1) of the Act, 21 U.S.C.section 360bbb-3(b)(1), unless the authorization is terminated  or revoked sooner.       Influenza A by PCR NEGATIVE NEGATIVE Final   Influenza B by PCR NEGATIVE NEGATIVE Final    Comment: (NOTE) The Xpert Xpress SARS-CoV-2/FLU/RSV plus assay is intended as an aid in the diagnosis of influenza from Nasopharyngeal swab specimens and should not be used as a sole basis for treatment. Nasal washings and aspirates are unacceptable for Xpert Xpress SARS-CoV-2/FLU/RSV testing.  Fact Sheet for Patients: EntrepreneurPulse.com.au  Fact Sheet for Healthcare Providers: IncredibleEmployment.be  This test is not yet approved or cleared by the [place] FDA and has been authorized for detection and/or diagnosis of SARS-CoV-2 by FDA under an Emergency Use Authorization (EUA). This EUA will remain in effect (meaning this test can be used) for the duration of the COVID-19 declaration under Section 564(b)(1) of the Act, 21 U.S.C. section 360bbb-3(b)(1), unless the authorization is terminated or revoked.     Resp Syncytial Virus by PCR NEGATIVE NEGATIVE Final    Comment: (NOTE) Fact Sheet for Patients: EntrepreneurPulse.com.au  Fact Sheet for Healthcare Providers: IncredibleEmployment.be  This test is not yet approved or cleared by the [place] FDA and has been authorized for detection and/or diagnosis of SARS-CoV-2 by  FDA under an Emergency Use Authorization (EUA). This EUA will remain in effect (meaning this test can be used) for the duration of the COVID-19 declaration under Section 564(b)(1) of the Act, 21 U.S.C. section 360bbb-3(b)(1), unless the authorization is terminated  or revoked.  Performed at [facility], [place] [address], [place], [place] [address]   Urine Culture     Status: None   Collection Time: 10/07/22  8:19 AM   Specimen: In/Out Cath Urine  Result Value Ref Range Status   Specimen Description   Final    IN/OUT CATH URINE Performed at [facility] [address] [place], [place] [address]    Special Requests   Final    NONE Performed at [facility], [place] [address], [place], [place] [address]    Culture   Final    NO GROWTH Performed at [facility], [place] [address], [place], [place] [address]    Report Status 10/08/2022 FINAL  Final  Culture, blood (Routine X 2) w Reflex to ID Panel     Status: Abnormal   Collection Time: 10/07/22  2:43 PM   Specimen: BLOOD  Result Value Ref Range Status   Specimen Description   Final    BLOOD BLOOD LEFT HAND Performed at [facility] [address] [place], [place] [address]    Special Requests   Final    BOTTLES DRAWN AEROBIC AND ANAEROBIC Blood Culture adequate volume Performed at [facility] [address] [place], [place] [address]    Culture  Setup Time   Final    GRAM POSITIVE COCCI ANAEROBIC BOTTLE ONLY Organism ID to follow CRITICAL RESULT CALLED TO, READ BACK BY AND VERIFIED WITH: L POINDEXTER,PHARMD'@0410'$  10/09/22 [place]    Culture (A)  Final    STAPHYLOCOCCUS EPIDERMIDIS THE SIGNIFICANCE OF ISOLATING THIS ORGANISM FROM A SINGLE SET OF BLOOD CULTURES WHEN MULTIPLE SETS ARE DRAWN IS UNCERTAIN. PLEASE NOTIFY THE MICROBIOLOGY DEPARTMENT WITHIN ONE WEEK IF SPECIATION AND SENSITIVITIES ARE REQUIRED. Performed at [facility], [place] [address], [place],  [address]    Report Status 10/11/2022 FINAL  Final  Blood Culture ID Panel (Reflexed)     Status: Abnormal   Collection Time: 10/07/22  2:43 PM  Result Value Ref Range Status   Enterococcus faecalis NOT DETECTED NOT  DETECTED Final   Enterococcus Faecium NOT DETECTED NOT DETECTED Final   Listeria monocytogenes NOT DETECTED NOT DETECTED Final   Staphylococcus species DETECTED (A) NOT DETECTED Final    Comment: CRITICAL RESULT CALLED TO, READ BACK BY AND VERIFIED WITH: L POINDEXTER,PHARMD'@0411'$  10/09/22 [place]    Staphylococcus aureus (BCID) NOT DETECTED NOT DETECTED Final   Staphylococcus epidermidis DETECTED (A) NOT DETECTED Final    Comment: Methicillin (oxacillin) resistant coagulase negative staphylococcus. Possible blood culture contaminant (unless isolated from more than one blood culture draw or clinical case suggests pathogenicity). No antibiotic treatment is indicated for blood  culture contaminants. CRITICAL RESULT CALLED TO, READ BACK BY AND VERIFIED WITH: L POINDEXTER,PHARMD'@0411'$  10/09/22 [place]    Staphylococcus lugdunensis NOT DETECTED NOT DETECTED Final   Streptococcus species NOT DETECTED NOT DETECTED Final   Streptococcus agalactiae NOT DETECTED NOT DETECTED Final   Streptococcus pneumoniae NOT DETECTED NOT DETECTED Final   Streptococcus pyogenes NOT DETECTED NOT DETECTED Final   A.calcoaceticus-baumannii NOT DETECTED NOT DETECTED Final   Bacteroides fragilis NOT DETECTED NOT DETECTED Final   Enterobacterales NOT DETECTED NOT DETECTED Final   Enterobacter  cloacae complex NOT DETECTED NOT DETECTED Final   Escherichia coli NOT DETECTED NOT DETECTED Final   Klebsiella aerogenes NOT DETECTED NOT DETECTED Final   Klebsiella oxytoca NOT DETECTED NOT DETECTED Final   Klebsiella pneumoniae NOT DETECTED NOT DETECTED Final   Proteus species NOT DETECTED NOT DETECTED Final   Salmonella species NOT DETECTED NOT DETECTED Final   Serratia marcescens NOT DETECTED NOT DETECTED Final   Haemophilus influenzae NOT DETECTED NOT DETECTED Final   Neisseria meningitidis NOT DETECTED NOT DETECTED Final   Pseudomonas aeruginosa NOT DETECTED NOT DETECTED Final   Stenotrophomonas maltophilia NOT DETECTED NOT  DETECTED Final   Candida albicans NOT DETECTED NOT DETECTED Final   Candida auris NOT DETECTED NOT DETECTED Final   Candida glabrata NOT DETECTED NOT DETECTED Final   Candida krusei NOT DETECTED NOT DETECTED Final   Candida parapsilosis NOT DETECTED NOT DETECTED Final   Candida tropicalis NOT DETECTED NOT DETECTED Final   Cryptococcus neoformans/gattii NOT DETECTED NOT DETECTED Final   Methicillin resistance mecA/C DETECTED (A) NOT DETECTED Final    Comment: CRITICAL RESULT CALLED TO, READ BACK BY AND VERIFIED WITH: L POINDEXTER,PHARMD'@0411'$  10/09/22 [place] Performed at [facility], [address] [place], [place] [address]   Culture, blood (Routine X 2) w Reflex to ID Panel     Status: None (Preliminary result)   Collection Time: 10/07/22  2:49 PM   Specimen: BLOOD  Result Value Ref Range Status   Specimen Description   Final    BLOOD BLOOD RIGHT HAND Performed at [facility] [address], [place], [place] [address]    Special Requests   Final    BOTTLES DRAWN AEROBIC AND ANAEROBIC Blood Culture adequate volume Performed at [facility] [address] [place], [place] [address]    Culture   Final    NO GROWTH 4 DAYS Performed at [facility], [place] [address] [place], [place] [address]    Report Status PENDING  Incomplete    Labs: CBC: Recent Labs  Lab 10/05/22 2326 10/06/22 1112 10/07/22 0852 10/08/22 0450  WBC 8.1 8.3 7.5 5.7  NEUTROABS 6.7 6.8  --  4.2  HGB 10.1* 9.5* 10.0* 9.4*  HCT 30.2* 28.5* 30.7* 28.4*  MCV 85.8 85.3 87.0 85.3  PLT 100* 78* 102* 99*   Basic Metabolic Panel: Recent Labs  Lab 10/06/22 1112 10/08/22 0450 10/08/22 1600 10/08/22 2127 10/09/22 0030 10/09/22 0748 10/10/22 0246 10/10/22 0917 10/10/22 1425 10/10/22 1847 10/11/22 0105 10/11/22 0944  NA 132* 130* 127*   < > 126*   < > 129* 128* 126* 126* 127* 129*  K 3.4* 3.9 4.1  --  4.4  --  4.1  --   --   --   --   --   CL 100 98 98   --  97*  --  100  --   --   --   --   --   CO2 21* 19* 20*  --  23  --  24  --   --   --   --   --   GLUCOSE 267* 159* 193*  --  173*  --  153*  --   --   --   --   --   BUN '18 17 17  '$ --  17  --  14  --   --   --   --   --   CREATININE 0.49 0.53 0.53  --  0.53  --  0.45  --   --   --   --   --   CALCIUM 8.4* 8.2* 7.8*  --  7.8*  --  8.0*  --   --   --   --   --   MG 1.8  --   --   --   --   --   --   --   --   --   --   --    < > = values in this interval not displayed.   Liver Function Tests: Recent Labs  Lab 10/05/22 2326 10/06/22 1112 10/09/22 1707  AST '29 31 30  '$ ALT '27 26 23  '$ ALKPHOS 55 53 70  BILITOT 2.1* 2.0* 2.1*  PROT 6.8 6.5 6.8  ALBUMIN 3.1* 3.2* 3.2*   CBG: Recent Labs  Lab 10/10/22 0707 10/10/22 1143 10/10/22 1658 10/10/22 2120 10/11/22 0730  GLUCAP 149* 175* 224* 252* 169*    Discharge time spent: 35 minutes.  Signed: Cordelia Poche, MD [facility] 10/11/2022

## 2022-10-12 LAB — CULTURE, BLOOD (ROUTINE X 2)
Culture: NO GROWTH
Special Requests: ADEQUATE

## 2022-10-13 DIAGNOSIS — K7469 Other cirrhosis of liver: Secondary | ICD-10-CM | POA: Diagnosis not present

## 2022-10-18 DIAGNOSIS — K7682 Hepatic encephalopathy: Secondary | ICD-10-CM | POA: Diagnosis not present

## 2022-10-18 DIAGNOSIS — K7460 Unspecified cirrhosis of liver: Secondary | ICD-10-CM | POA: Diagnosis not present

## 2022-10-18 DIAGNOSIS — I81 Portal vein thrombosis: Secondary | ICD-10-CM | POA: Diagnosis not present

## 2022-10-19 DIAGNOSIS — E1169 Type 2 diabetes mellitus with other specified complication: Secondary | ICD-10-CM | POA: Diagnosis not present

## 2022-10-19 DIAGNOSIS — M810 Age-related osteoporosis without current pathological fracture: Secondary | ICD-10-CM | POA: Diagnosis not present

## 2022-10-19 DIAGNOSIS — I10 Essential (primary) hypertension: Secondary | ICD-10-CM | POA: Diagnosis not present

## 2022-10-19 DIAGNOSIS — E785 Hyperlipidemia, unspecified: Secondary | ICD-10-CM | POA: Diagnosis not present

## 2022-10-21 DIAGNOSIS — M810 Age-related osteoporosis without current pathological fracture: Secondary | ICD-10-CM | POA: Diagnosis not present

## 2022-10-21 DIAGNOSIS — D696 Thrombocytopenia, unspecified: Secondary | ICD-10-CM | POA: Diagnosis not present

## 2022-10-21 DIAGNOSIS — E7800 Pure hypercholesterolemia, unspecified: Secondary | ICD-10-CM | POA: Diagnosis not present

## 2022-10-21 DIAGNOSIS — E119 Type 2 diabetes mellitus without complications: Secondary | ICD-10-CM | POA: Diagnosis not present

## 2022-10-21 DIAGNOSIS — D500 Iron deficiency anemia secondary to blood loss (chronic): Secondary | ICD-10-CM | POA: Diagnosis not present

## 2022-10-21 DIAGNOSIS — K7682 Hepatic encephalopathy: Secondary | ICD-10-CM | POA: Diagnosis not present

## 2022-10-21 DIAGNOSIS — E1169 Type 2 diabetes mellitus with other specified complication: Secondary | ICD-10-CM | POA: Diagnosis not present

## 2022-10-21 DIAGNOSIS — K7469 Other cirrhosis of liver: Secondary | ICD-10-CM | POA: Diagnosis not present

## 2022-10-21 DIAGNOSIS — Z0000 Encounter for general adult medical examination without abnormal findings: Secondary | ICD-10-CM | POA: Diagnosis not present

## 2022-10-21 DIAGNOSIS — I81 Portal vein thrombosis: Secondary | ICD-10-CM | POA: Diagnosis not present

## 2022-10-21 DIAGNOSIS — Z9181 History of falling: Secondary | ICD-10-CM | POA: Diagnosis not present

## 2022-10-26 ENCOUNTER — Encounter (HOSPITAL_BASED_OUTPATIENT_CLINIC_OR_DEPARTMENT_OTHER): Payer: Self-pay | Admitting: Emergency Medicine

## 2022-10-26 ENCOUNTER — Emergency Department (HOSPITAL_BASED_OUTPATIENT_CLINIC_OR_DEPARTMENT_OTHER): Payer: Medicare Other

## 2022-10-26 ENCOUNTER — Observation Stay (HOSPITAL_BASED_OUTPATIENT_CLINIC_OR_DEPARTMENT_OTHER)
Admission: EM | Admit: 2022-10-26 | Discharge: 2022-10-27 | Disposition: A | Discharge status: Home / Self Care | Payer: Medicare Other | Attending: Internal Medicine | Admitting: Internal Medicine

## 2022-10-26 ENCOUNTER — Observation Stay (HOSPITAL_COMMUNITY): Payer: Medicare Other

## 2022-10-26 ENCOUNTER — Other Ambulatory Visit: Payer: Self-pay

## 2022-10-26 DIAGNOSIS — Z7984 Long term (current) use of oral hypoglycemic drugs: Secondary | ICD-10-CM | POA: Insufficient documentation

## 2022-10-26 DIAGNOSIS — J189 Pneumonia, unspecified organism: Secondary | ICD-10-CM | POA: Diagnosis not present

## 2022-10-26 DIAGNOSIS — Z7982 Long term (current) use of aspirin: Secondary | ICD-10-CM | POA: Diagnosis not present

## 2022-10-26 DIAGNOSIS — I8500 Esophageal varices without bleeding: Secondary | ICD-10-CM

## 2022-10-26 DIAGNOSIS — D649 Anemia, unspecified: Secondary | ICD-10-CM | POA: Insufficient documentation

## 2022-10-26 DIAGNOSIS — R918 Other nonspecific abnormal finding of lung field: Secondary | ICD-10-CM | POA: Diagnosis not present

## 2022-10-26 DIAGNOSIS — Z85038 Personal history of other malignant neoplasm of large intestine: Secondary | ICD-10-CM | POA: Insufficient documentation

## 2022-10-26 DIAGNOSIS — U071 COVID-19: Principal | ICD-10-CM | POA: Diagnosis present

## 2022-10-26 DIAGNOSIS — C189 Malignant neoplasm of colon, unspecified: Secondary | ICD-10-CM | POA: Diagnosis not present

## 2022-10-26 DIAGNOSIS — Z79899 Other long term (current) drug therapy: Secondary | ICD-10-CM | POA: Insufficient documentation

## 2022-10-26 DIAGNOSIS — E785 Hyperlipidemia, unspecified: Secondary | ICD-10-CM | POA: Diagnosis present

## 2022-10-26 DIAGNOSIS — I700 Atherosclerosis of aorta: Secondary | ICD-10-CM | POA: Diagnosis not present

## 2022-10-26 DIAGNOSIS — R4182 Altered mental status, unspecified: Secondary | ICD-10-CM | POA: Diagnosis present

## 2022-10-26 DIAGNOSIS — G9341 Metabolic encephalopathy: Secondary | ICD-10-CM | POA: Diagnosis not present

## 2022-10-26 DIAGNOSIS — I629 Nontraumatic intracranial hemorrhage, unspecified: Secondary | ICD-10-CM | POA: Insufficient documentation

## 2022-10-26 DIAGNOSIS — G9340 Encephalopathy, unspecified: Secondary | ICD-10-CM

## 2022-10-26 DIAGNOSIS — E119 Type 2 diabetes mellitus without complications: Secondary | ICD-10-CM | POA: Diagnosis not present

## 2022-10-26 DIAGNOSIS — E871 Hypo-osmolality and hyponatremia: Secondary | ICD-10-CM

## 2022-10-26 DIAGNOSIS — D696 Thrombocytopenia, unspecified: Secondary | ICD-10-CM | POA: Insufficient documentation

## 2022-10-26 DIAGNOSIS — Z8719 Personal history of other diseases of the digestive system: Secondary | ICD-10-CM

## 2022-10-26 DIAGNOSIS — I10 Essential (primary) hypertension: Secondary | ICD-10-CM | POA: Diagnosis not present

## 2022-10-26 DIAGNOSIS — D72819 Decreased white blood cell count, unspecified: Secondary | ICD-10-CM | POA: Diagnosis not present

## 2022-10-26 DIAGNOSIS — K766 Portal hypertension: Secondary | ICD-10-CM

## 2022-10-26 LAB — RESP PANEL BY RT-PCR (RSV, FLU A&B, COVID)  RVPGX2
Influenza A by PCR: NEGATIVE
Influenza B by PCR: NEGATIVE
Resp Syncytial Virus by PCR: NEGATIVE
SARS Coronavirus 2 by RT PCR: POSITIVE — AB

## 2022-10-26 LAB — COMPREHENSIVE METABOLIC PANEL
ALT: 31 U/L (ref 0–44)
AST: 35 U/L (ref 15–41)
Albumin: 4 g/dL (ref 3.5–5.0)
Alkaline Phosphatase: 53 U/L (ref 38–126)
Anion gap: 11 (ref 5–15)
BUN: 12 mg/dL (ref 8–23)
CO2: 22 mmol/L (ref 22–32)
Calcium: 9.7 mg/dL (ref 8.9–10.3)
Chloride: 96 mmol/L — ABNORMAL LOW (ref 98–111)
Creatinine, Ser: 0.45 mg/dL (ref 0.44–1.00)
GFR, Estimated: >60 mL/min (ref >60)
Glucose, Bld: 129 mg/dL — ABNORMAL HIGH (ref 70–99)
Potassium: 4 mmol/L (ref 3.5–5.1)
Sodium: 129 mmol/L — ABNORMAL LOW (ref 135–145)
Total Bilirubin: 2.8 mg/dL — ABNORMAL HIGH (ref 0.3–1.2)
Total Protein: 7.8 g/dL (ref 6.5–8.1)

## 2022-10-26 LAB — AMMONIA: Ammonia: 34 umol/L (ref 9–35)

## 2022-10-26 LAB — URINALYSIS, ROUTINE W REFLEX MICROSCOPIC
Bacteria, UA: NONE SEEN
Bilirubin Urine: NEGATIVE
Glucose, UA: NEGATIVE mg/dL
Hgb urine dipstick: NEGATIVE
Ketones, ur: 15 mg/dL — AB
Nitrite: NEGATIVE
Specific Gravity, Urine: 1.026 (ref 1.005–1.030)
pH: 6 (ref 5.0–8.0)

## 2022-10-26 LAB — CBC
HCT: 33.7 % — ABNORMAL LOW (ref 36.0–46.0)
Hemoglobin: 11.2 g/dL — ABNORMAL LOW (ref 12.0–15.0)
MCH: 27.7 pg (ref 26.0–34.0)
MCHC: 33.2 g/dL (ref 30.0–36.0)
MCV: 83.2 fL (ref 80.0–100.0)
Platelets: 103 10*3/uL — ABNORMAL LOW (ref 150–400)
RBC: 4.05 MIL/uL (ref 3.87–5.11)
RDW: 15.3 % (ref 11.5–15.5)
WBC: 4 10*3/uL (ref 4.0–10.5)
nRBC: 0 % (ref 0.0–0.2)

## 2022-10-26 LAB — CBG MONITORING, ED: Glucose-Capillary: 124 mg/dL — ABNORMAL HIGH (ref 70–99)

## 2022-10-26 LAB — DIFFERENTIAL
Abs Immature Granulocytes: 0.01 10*3/uL (ref 0.00–0.07)
Basophils Absolute: 0 10*3/uL (ref 0.0–0.1)
Basophils Relative: 1 %
Eosinophils Absolute: 0 10*3/uL (ref 0.0–0.5)
Eosinophils Relative: 0 %
Immature Granulocytes: 0 %
Lymphocytes Relative: 16 %
Lymphs Abs: 0.6 10*3/uL — ABNORMAL LOW (ref 0.7–4.0)
Monocytes Absolute: 0.8 10*3/uL (ref 0.1–1.0)
Monocytes Relative: 20 %
Neutro Abs: 2.5 10*3/uL (ref 1.7–7.7)
Neutrophils Relative %: 63 %

## 2022-10-26 LAB — RETICULOCYTES
Immature Retic Fract: 12.7 % (ref 2.3–15.9)
RBC.: 3.39 MIL/uL — ABNORMAL LOW (ref 3.87–5.11)
Retic Count, Absolute: 72.5 10*3/uL (ref 19.0–186.0)
Retic Ct Pct: 2.1 % (ref 0.4–3.1)

## 2022-10-26 LAB — PROTIME-INR
INR: 1.3 — ABNORMAL HIGH (ref 0.8–1.2)
Prothrombin Time: 16.1 seconds — ABNORMAL HIGH (ref 11.4–15.2)

## 2022-10-26 LAB — ETHANOL: Alcohol, Ethyl (B): <10 mg/dL (ref <10)

## 2022-10-26 LAB — GLUCOSE, CAPILLARY: Glucose-Capillary: 103 mg/dL — ABNORMAL HIGH (ref 70–99)

## 2022-10-26 LAB — APTT: aPTT: 33 seconds (ref 24–36)

## 2022-10-26 MED ORDER — ACETAMINOPHEN 325 MG PO TABS: 650.0000 mg | ORAL_TABLET | Freq: Four times a day (QID) | ORAL | Status: DC | PRN

## 2022-10-26 MED ORDER — SODIUM CHLORIDE 0.9% FLUSH
3.0000 mL | Freq: Two times a day (BID) | INTRAVENOUS | Status: DC
  Administered 2022-10-26 – 2022-10-27 (×2): 3 mL via INTRAVENOUS

## 2022-10-26 MED ORDER — ONDANSETRON HCL 4 MG PO TABS: 4.0000 mg | ORAL_TABLET | Freq: Four times a day (QID) | ORAL | Status: DC | PRN

## 2022-10-26 MED ORDER — PANTOPRAZOLE SODIUM 40 MG PO TBEC
40.0000 mg | DELAYED_RELEASE_TABLET | Freq: Every day | ORAL | Status: DC
  Administered 2022-10-27: 40 mg via ORAL
  Filled 2022-10-26: qty 1

## 2022-10-26 MED ORDER — LACTULOSE 10 GM/15ML PO SOLN
10.0000 g | Freq: Every day | ORAL | Status: DC
  Administered 2022-10-27: 10 g via ORAL
  Filled 2022-10-26: qty 15

## 2022-10-26 MED ORDER — ALBUTEROL SULFATE HFA 108 (90 BASE) MCG/ACT IN AERS
2.0000 | INHALATION_SPRAY | Freq: Four times a day (QID) | RESPIRATORY_TRACT | Status: DC
  Administered 2022-10-27 (×2): 2 via RESPIRATORY_TRACT
  Filled 2022-10-26: qty 6.7

## 2022-10-26 MED ORDER — ONDANSETRON HCL 4 MG/2ML IJ SOLN: 4.0000 mg | Freq: Four times a day (QID) | INTRAMUSCULAR | Status: DC | PRN

## 2022-10-26 MED ORDER — SODIUM CHLORIDE 0.9% FLUSH: 3.0000 mL | Freq: Once | INTRAVENOUS | Status: DC

## 2022-10-26 MED ORDER — ROSUVASTATIN CALCIUM 5 MG PO TABS
10.0000 mg | ORAL_TABLET | Freq: Every day | ORAL | Status: DC
  Administered 2022-10-27: 10 mg via ORAL
  Filled 2022-10-26: qty 2

## 2022-10-26 MED ORDER — NADOLOL 40 MG PO TABS
120.0000 mg | ORAL_TABLET | Freq: Every day | ORAL | Status: DC
  Administered 2022-10-27: 120 mg via ORAL
  Filled 2022-10-26: qty 3

## 2022-10-26 MED ORDER — SODIUM CHLORIDE 0.9 % IV SOLN: INTRAVENOUS | Status: DC

## 2022-10-26 MED ORDER — INSULIN ASPART 100 UNIT/ML IJ SOLN: 0.0000 [IU] | INTRAMUSCULAR | Status: DC

## 2022-10-26 MED ORDER — HYDROCODONE-ACETAMINOPHEN 5-325 MG PO TABS: 1.0000 | ORAL_TABLET | ORAL | Status: DC | PRN

## 2022-10-26 MED ORDER — GUAIFENESIN-DM 100-10 MG/5ML PO SYRP: 10.0000 mL | ORAL_SOLUTION | ORAL | Status: DC | PRN

## 2022-10-26 NOTE — Assessment & Plan Note (Signed)
Supportive management Obtain CXR Inflammatory markers   No evidence of hypoxia

## 2022-10-26 NOTE — Assessment & Plan Note (Signed)
Continue nadolol '120mg'$  po TID

## 2022-10-26 NOTE — Assessment & Plan Note (Signed)
Continue Crestor 10 mgpo q day

## 2022-10-26 NOTE — H&P (Signed)
Amber Crosby UDJ:[id number] DOB: 12-25-1945 DOA: 10/26/2022 b PCP: Antony Contras, MD   Outpatient Specialists:  CARDS:  Dr. Oval Linsey    GI  Dr.  Watt Climes [facility], ) [facility] hepatology    Patient arrived to ER on 10/26/22 at 1632 Referred by Attending Dory Horn, MD   Patient coming from:    home Lives  With family   Chief Complaint:   Chief Complaint  Patient presents with   Altered Mental Status    HPI: Amber Crosby is a 77 y.o. female with medical history significant of DM2, cirrhosis due Chemotherapy UTI recently, chronic hyponatremia, recent head bleed, history of colon cancer portal hypertension esophageal varices history of portal vein thrombosis currently off of Eliquis, osteoporosis    Presented with confusion Patient came from home with confusion since last night daughter for his first with urinary tract infection but when was checked at walk-in clinic no UTI was found that she was sent to emergency department.  There she found to be alert and oriented to self but unable to tell the date or year where she is. Patient with history of traumatic head injury and brain bleed in December due to a fall. While in emergency department tested positive for COVID CT head negative She has chronic hyponatremia with sodium down to 129   No recent fall no blood per rectum no melena   Does not smoke or drink  Her family has covid for the past few days  Reports cough for the past 2 days  Low grade fever Has been compliant with lactulose     Initial COVID TEST    POSITIVE,    Lab Results  Component Value Date   [place] (A) 10/26/2022   [place] NEGATIVE 10/06/2022   [place] NEGATIVE 07/02/2022     Regarding pertinent Chronic problems:     Hyperlipidemia - on statins Crestor Lipid Panel     Component Value Date/Time   CHOL 129 03/03/2018 0926   TRIG 47 03/03/2018 0926   HDL 60 03/03/2018 0926   CHOLHDL 2.2 03/03/2018 0926   CHOLHDL 2.8  05/18/2016 1029   VLDL 13 05/18/2016 1029   LDLCALC 60 03/03/2018 0926   LABVLDL 9 03/03/2018 0926     HTN on Norvasc, Cozaar, nadolol      DM 2 -  Lab Results  Component Value Date   HGBA1C 6.9 (H) 10/06/2022   PO meds only,       Hx of portal vein thrombosis on -no longer on anticoagulation secondary to head bleed     Liver disease MELD 3.0: 19 at 10/26/2022  4:43 PM MELD-Na: 20 at 10/26/2022  4:43 PM Calculated from: Serum Creatinine: 0.45 mg/dL (Using min of 1 mg/dL) at 10/26/2022  4:43 PM Serum Sodium: 129 mmol/L at 10/26/2022  4:43 PM Total Bilirubin: 2.8 mg/dL at 10/26/2022  4:43 PM Serum Albumin: 4.0 g/dL (Using max of 3.5 g/dL) at 10/26/2022  4:43 PM INR(ratio): 1.3 at 10/26/2022  4:43 PM Age at listing (hypothetical): 67 years Sex: Female at 10/26/2022  4:43 PM    Chronic anemia - baseline hg Hemoglobin & Hematocrit  Recent Labs    10/07/22 0852 10/08/22 0450 10/26/22 1643  HGB 10.0* 9.4* 11.2*     While in ER: Clinical Course as of 10/26/22 2253  Tue Oct 26, 2022  1809 Patient with positive home COVID test, she is having some coughing for the last 3 days, no significant respiratory distress. [CP]  Clinical Course User Index [CP] Anselmo Pickler, PA-C     Ordered  CT HEAD   NON acute  CXR - Bilateral interstitial nodularity may represent atypical pneumonia     Following Medications were ordered in ER: Medications  sodium chloride flush (NS) 0.9 % injection 3 mL (has no administration in time range)       ED Triage Vitals  Enc Vitals Group     BP 10/26/22 1635 (!) 157/66     Pulse Rate 10/26/22 1635 85     Resp 10/26/22 1635 15     Temp 10/26/22 1635 98 F (36.7 C)     Temp Source 10/26/22 2118 Oral     SpO2 10/26/22 1635 98 %     Weight 10/26/22 1638 151 lb 14.4 oz (68.9 kg)     Height 10/26/22 1638 '5\' 2"'$  (1.575 m)     Head Circumference --      Peak Flow --      Pain Score 10/26/22 1638 0     Pain Loc --      Pain Edu? --      Excl. in [place]? --    TMAX(24)@     _________________________________________ Significant initial  Findings: Abnormal Labs Reviewed  RESP PANEL BY RT-PCR (RSV, FLU A&B, COVID)  RVPGX2 - Abnormal; Notable for the following components:      Result Value   SARS Coronavirus 2 by RT PCR POSITIVE (*)    All other components within normal limits  PROTIME-INR - Abnormal; Notable for the following components:   Prothrombin Time 16.1 (*)    INR 1.3 (*)    All other components within normal limits  CBC - Abnormal; Notable for the following components:   Hemoglobin 11.2 (*)    HCT 33.7 (*)    Platelets 103 (*)    All other components within normal limits  DIFFERENTIAL - Abnormal; Notable for the following components:   Lymphs Abs 0.6 (*)    All other components within normal limits  COMPREHENSIVE METABOLIC PANEL - Abnormal; Notable for the following components:   Sodium 129 (*)    Chloride 96 (*)    Glucose, Bld 129 (*)    Total Bilirubin 2.8 (*)    All other components within normal limits  URINALYSIS, ROUTINE W REFLEX MICROSCOPIC - Abnormal; Notable for the following components:   Ketones, ur 15 (*)    Protein, ur TRACE (*)    Leukocytes,Ua TRACE (*)    All other components within normal limits  CBG MONITORING, ED - Abnormal; Notable for the following components:   Glucose-Capillary 124 (*)    All other components within normal limits     ____  Troponin    ECG: Ordered Personally reviewed and interpreted by me showing: HR : 81 Rhythm: Sinus rhythm Borderline T wave abnormalities Artifact in lead(s) I II aVR QTC 421    The recent clinical data is shown below. Vitals:   10/26/22 1900 10/26/22 1930 10/26/22 2118 10/26/22 2207  BP: (!) 129/90 (!) 133/54 (!) 128/58 (!) 131/49  Pulse: 75 77 76 77  Resp: 19 (!) 23 (!) 22 18  Temp:   99.4 F (37.4 C) 99.1 F (37.3 C)  TempSrc:   Oral Oral  SpO2: 97% 97% 96% 97%  Weight:      Height:        WBC     Component Value Date/Time   WBC 4.0  10/26/2022 1643   LYMPHSABS 0.6 (L)  10/26/2022 1643   LYMPHSABS 0.8 12/05/2017 1018   LYMPHSABS 0.8 (L) 03/24/2012 1002   MONOABS 0.8 10/26/2022 1643   MONOABS 0.5 03/24/2012 1002   EOSABS 0.0 10/26/2022 1643   EOSABS 0.4 12/05/2017 1018   BASOSABS 0.0 10/26/2022 1643   BASOSABS 0.1 12/05/2017 1018   BASOSABS 0.1 03/24/2012 1002     Procalcitonin   Ordered    UA   no evidence of UTI      Urine analysis:    Component Value Date/Time   COLORURINE YELLOW 10/26/2022 1815   APPEARANCEUR CLEAR 10/26/2022 1815   LABSPEC 1.026 10/26/2022 1815   PHURINE 6.0 10/26/2022 1815   GLUCOSEU NEGATIVE 10/26/2022 1815   HGBUR NEGATIVE 10/26/2022 1815   BILIRUBINUR NEGATIVE 10/26/2022 1815   BILIRUBINUR neg 04/20/2014 1007   KETONESUR 15 (A) 10/26/2022 1815   PROTEINUR TRACE (A) 10/26/2022 1815   UROBILINOGEN 0.2 04/20/2014 1007   NITRITE NEGATIVE 10/26/2022 1815   LEUKOCYTESUR TRACE (A) 10/26/2022 1815    Results for orders placed or performed during the hospital encounter of 10/26/22  Resp panel by RT-PCR (RSV, Flu A&B, Covid) Anterior Nasal Swab     Status: Abnormal   Collection Time: 10/26/22  6:16 PM   Specimen: Anterior Nasal Swab  Result Value Ref Range Status   SARS Coronavirus 2 by RT PCR POSITIVE (A) NEGATIVE Final         Influenza A by PCR NEGATIVE NEGATIVE Final   Influenza B by PCR NEGATIVE NEGATIVE Final         Resp Syncytial Virus by PCR NEGATIVE NEGATIVE Final     _______________________________________________ Hospitalist was called for admission for covid infection and acute encephalopahty   The following Work up has been ordered so far:  Orders Placed This Encounter  Procedures   Resp panel by RT-PCR (RSV, Flu A&B, Covid) Anterior Nasal Swab   Urine Culture (for pregnant, neutropenic or urologic patients or patients with an indwelling urinary catheter)   CT HEAD WO CONTRAST   Protime-INR   APTT   CBC   Differential   Comprehensive metabolic panel    Ethanol   Ammonia   Urinalysis, Routine w reflex microscopic -Urine, Clean Catch   Diet NPO time specified   Swallow screen (NPO until completed)   NIH Stroke Scale   Saline Lock IV, Maintain IV access   If O2 sat   Consult to hospitalist  Callback to [phone]   Airborne and Contact precautions   Pulse oximetry, continuous   CBG monitoring, ED   ED EKG   EKG 12-Lead   Place in observation (patient's expected length of stay will be less than 2 midnights)     OTHER Significant initial  Findings:  labs showing:    Recent Labs  Lab 10/26/22 1643  NA 129*  K 4.0  CO2 22  GLUCOSE 129*  BUN 12  CREATININE 0.45  CALCIUM 9.7    Cr  stable,   Lab Results  Component Value Date   CREATININE 0.45 10/26/2022   CREATININE 0.45 10/10/2022   CREATININE 0.53 10/09/2022    Recent Labs  Lab 10/26/22 1643  AST 35  ALT 31  ALKPHOS 53  BILITOT 2.8*  PROT 7.8  ALBUMIN 4.0   Lab Results  Component Value Date   CALCIUM 9.7 10/26/2022    Plt: Lab Results  Component Value Date   PLT 103 (L) 10/26/2022    COVID-19 Labs  Recent Labs    10/26/22 2322  FERRITIN  23  LDH 175  CRP 0.9    Lab Results  Component Value Date   SARSCOV2NAA POSITIVE (A) 10/26/2022   SARSCOV2NAA NEGATIVE 10/06/2022   [place] NEGATIVE 07/02/2022    Venous  Blood Gas result:  pH  7.49 High  Acid-base deficit 0.9 mmol/L   pCO2, Ven 29 Low  mmHg O2 Saturation 95.3 %  pO2, Ven 58 High  mmHg        Recent Labs  Lab 10/26/22 1643  WBC 4.0  NEUTROABS 2.5  HGB 11.2*  HCT 33.7*  MCV 83.2  PLT 103*    HG/HCT  stable,       Component Value Date/Time   HGB 9.5 (L) 10/26/2022 2335   HGB 13.3 12/05/2017 1018   HGB 11.4 (L) 03/24/2012 1002   HCT 27.8 (L) 10/26/2022 2335   HCT 38.3 12/05/2017 1018   HCT 33.5 (L) 03/24/2012 1002   MCV 82.7 10/26/2022 2335   MCV 90 12/05/2017 1018   MCV 91.3 03/24/2012 1002    Recent Labs  Lab 10/26/22 1647  AMMONIA 34       DM  labs:   HbA1C: Recent Labs    10/06/22 1112  HGBA1C 6.9*    CBG (last 3)  Recent Labs    10/26/22 1750  GLUCAP 124*    Cultures:    Component Value Date/Time   SDES  10/07/2022 1449    BLOOD BLOOD RIGHT HAND Performed at [facility], [place] [address] [place], [place] [address]    SPECREQUEST  10/07/2022 1449    BOTTLES DRAWN AEROBIC AND ANAEROBIC Blood Culture adequate volume Performed at [facility] [address], [place], [place] [address]    CULT  10/07/2022 1449    NO GROWTH 5 DAYS Performed at [facility] [address], [place], [place] [address]    REPTSTATUS 10/12/2022 FINAL 10/07/2022 1449     Radiological Exams on Admission: CT HEAD WO CONTRAST  Result Date: 10/26/2022 CLINICAL DATA:  Altered mental status EXAM: CT HEAD WITHOUT CONTRAST TECHNIQUE: Contiguous axial images were obtained from the base of the skull through the vertex without intravenous contrast. RADIATION DOSE REDUCTION: This exam was performed according to the departmental dose-optimization program which includes automated exposure control, adjustment of the mA and/or kV according to patient size and/or use of iterative reconstruction technique. COMPARISON:  CT brain 10/06/2022 FINDINGS: Brain: No acute territorial infarction, hemorrhage or intracranial mass. Advanced white matter hypodensity. Stable ventricle size. Vascular: No hyperdense vessels. Carotid and vertebral vascular calcification Skull: Normal. Negative for fracture or focal lesion. Sinuses/Orbits: Opacified right sphenoid sinus with hyperdense secretions. Other: None IMPRESSION: 1. No CT evidence for acute intracranial abnormality. 2. Atrophy and chronic small vessel ischemic changes of the white matter. Electronically Signed   By: Donavan Foil M.D.   On: 10/26/2022 17:34   _______________________________________________________________________________________________________ Latest  Blood pressure  (!) 131/49, pulse 77, temperature 99.1 F (37.3 C), temperature source Oral, resp. rate 18, height '5\' 2"'$  (1.575 m), weight 68.9 kg, SpO2 97 %.   Vitals  labs and radiology finding personally reviewed  Review of Systems:    Pertinent positives include:   Fevers, chills, fatigue, confusion Constitutional:  No weight loss, night sweats, , weight loss  HEENT:  No headaches, Difficulty swallowing,Tooth/dental problems,Sore throat,  No sneezing, itching, ear ache, nasal congestion, post nasal drip,  Cardio-vascular:  No chest pain, Orthopnea, PND, anasarca, dizziness, palpitations.no Bilateral lower extremity swelling  GI:  No heartburn,  indigestion, abdominal pain, nausea, vomiting, diarrhea, change in bowel habits, loss of appetite, melena, blood in stool, hematemesis Resp:  no shortness of breath at rest. No dyspnea on exertion, No excess mucus, no productive cough, No non-productive cough, No coughing up of blood.No change in color of mucus.No wheezing. Skin:  no rash or lesions. No jaundice GU:  no dysuria, change in color of urine, no urgency or frequency. No straining to urinate.  No flank pain.  Musculoskeletal:  No joint pain or no joint swelling. No decreased range of motion. No back pain.  Psych:  No change in mood or affect. No depression or anxiety. No memory loss.  Neuro: no localizing neurological complaints, no tingling, no weakness, no double vision, no gait abnormality, no slurred speech, no   All systems reviewed and apart from [place] all are negative _______________________________________________________________________________________________ Past Medical History:   Past Medical History:  Diagnosis Date   Anxiety    Colon cancer ([place])    Without evidence of recurrence   Diabetes mellitus    Type 2   Ear infection    Essential hypertension    Hemorrhoids    Internal-external small hemorrhoids   Hypercholesterolemia    Portal vein thrombosis 01/29/2020      Past Surgical History:  Procedure Laterality Date   ESOPHAGOGASTRODUODENOSCOPY   08/04/2010   VAGINAL HYSTERECTOMY  1979    Social History:  Ambulatory   independently        reports that she has never smoked. She has never used smokeless tobacco. She reports that she does not drink alcohol and does not use drugs.     Family History:  Family History  Problem Relation Age of Onset   Hypertension Mother    Cancer Mother    Diabetes Mother    ______________________________________________________________________________________________ Allergies: Allergies  Allergen Reactions   Meperidine And Related Nausea And Vomiting   Other Rash and Hives   Amoxicillin-Pot Clavulanate Other (See Comments)    Patient reports it caused a "sore throat"   Contrast Media [Iodinated Contrast Media]     Pt can not have IV dye under any circumstances   Iohexol     Pt had reaction (hives) even with pre-meds, so patient can not have IV dye for any CT    Morphine And Related     Prior to Admission medications   Medication Sig Start Date End Date Taking? Authorizing Provider  alendronate (FOSAMAX) 70 MG tablet Take 70 mg by mouth once a week. Friday 03/11/22   [provider]  ALPRAZolam Duanne Moron) 0.25 MG tablet Take 1 tablet (0.25 mg total) by mouth at bedtime as needed for anxiety. 10/23/15   Darlin Coco, MD  amLODipine (NORVASC) 5 MG tablet Take 1 tablet (5 mg total) by mouth daily. NEED APPOINTMENT 08/16/22   Skeet Latch, MD  aspirin EC 81 MG tablet Take 81 mg by mouth daily. 09/16/22   [provider]  Cholecalciferol (VITAMIN D PO) Take 1 tablet by mouth daily.     [provider]  lactulose (CHRONULAC) 10 GM/15ML solution Take 15 mLs (10 g total) by mouth daily. 10/11/22   Mariel Aloe, MD  loperamide (IMODIUM A-D) 2 MG tablet Take 1 tablet (2 mg total) by mouth 4 (four) times daily as needed for diarrhea or loose stools. 01/03/21   Fredia Sorrow, MD   losartan (COZAAR) 100 MG tablet Take 1 tablet (100 mg total) by mouth daily. 10/11/22 01/09/23  Mariel Aloe, MD  metFORMIN (  GLUCOPHAGE) 500 MG tablet Take 1,000 mg by mouth See admin instructions. Taking 1000 mg in the AM and 500 mg in the evening    [provider]  nadolol (CORGARD) 40 MG tablet Take 120 mg by mouth daily. 3 tabs daily    [provider]  omeprazole (PRILOSEC) 20 MG capsule Take 20 mg by mouth daily as needed (GERD). 07/22/22   [provider]  ondansetron (ZOFRAN ODT) 4 MG disintegrating tablet Take 1 tablet (4 mg total) by mouth every 8 (eight) hours as needed for nausea or vomiting. 01/03/21   Fredia Sorrow, MD  ondansetron (ZOFRAN) 8 MG tablet Take 8 mg by mouth every 8 (eight) hours as needed for nausea or vomiting.    [provider]  rosuvastatin (CRESTOR) 10 MG tablet Take 10 mg by mouth daily. 09/19/22   [provider]    ___________________________________________________________________________________________________ Physical Exam:    10/26/2022   10:07 PM 10/26/2022    9:18 PM 10/26/2022    7:30 PM  Vitals with BMI  Systolic 536 144 315  Diastolic 49 58 54  Pulse 77 76 77    1. General:  in No  Acute distress   Chronically ill -appearing 2. Psychological: Alert and  Oriented 3. Head/ENT:    Dry Mucous Membranes                          Head Non traumatic, neck supple                           Poor Dentition 4. SKIN:  decreased Skin turgor,  Skin clean Dry and intact no rash 5. Heart: Regular rate and rhythm no  Murmur, no Rub or gallop 6. Lungs:  no wheezes or crackles   7. Abdomen: Soft,  non-tender, Non distended  bowel sounds present 8. Lower extremities: no clubbing, cyanosis, no  edema 9. Neurologically   strength 5 out of 5 in all 4 extremities cranial nerves II through XII intact 10. MSK: Normal range of motion    Chart has been reviewed  _____________  Assessment/Plan 77 y.o. female with  medical history significant of DM2, cirrhosis due Chemotherapy UTI recently, chronic hyponatremia, recent head bleed, history of colon cancer portal hypertension esophageal varices history of portal vein thrombosis currently off of Eliquis, osteoporosis   Admitted for acute encephalopathy    Present on Admission:  COVID-19 virus infection  HTN (hypertension)  Hyperlipemia  Thrombocytopenia (HCC)  Colon cancer ([place])  Portal hypertension with esophageal varices (HCC)  Chronic hyponatremia  Acute metabolic encephalopathy  CAP (community acquired pneumonia)  Leukopenia   COVID-19 virus infection Supportive management Obtain CXR Inflammatory markers   No evidence of hypoxia  HTN (hypertension) Allow permissive htn for now  Hyperlipemia Continue Crestor 10 mgpo q day  DM2 (diabetes mellitus, type 2) ([place]) Order sliding scale hold metfromin  Thrombocytopenia (HCC) Chronic stable in the setting of history of cirrhosis  Colon cancer ([place]) Currently in remission  Portal hypertension with esophageal varices (HCC) Continue nadolol '120mg'$  po TID  Intracranial hemorrhage ([place]) In December since then her Eliquis and aspirin has been on hold  Chronic hyponatremia Sodium suspect to be in mid to high 120s-low 130s. Followed by liver doctor.  Likely secondary to cirrhosis  Acute metabolic encephalopathy   - most likely multifactorial secondary to combination of  covid infection   mild dehydration secondary to decreased by  mouth intake,     - Will rehydrate     - if no improvement may need further imaging to evaluate for CNS pathology pathology such as MRI of the brain   - neurological exam appears to be nonfocal but patient unable to cooperate fully   - VBG  ordered    -   history of liver disease but ammonia unremarkable   History of cirrhosis of liver Chronic stable Followed by  [facility]patology at [facility]  CAP (community acquired pneumonia) More likely viral  Check pro  calcitonin For tonight cover with rocephin azithro  Await results of procalcitonin  Check strep pneumo  Sputum cult    Leukopenia Suspect viral  Continue to follow  Normocytic anemia Obtain anemia panel denies any history of bleeding   Other plan as per orders.  DVT prophylaxis:  SCD      Code Status:    Code Status: Prior FULL CODE  as per patient   I had personally discussed CODE STATUS with  family     Family Communication:   Family not at  Bedside  plan of care was discussed on the phone with  Daughter,    Disposition Plan:     To home once workup is complete and patient is stable   Following barriers for discharge:                           Mental status improves                           Will need consultants to evaluate patient prior to discharge                     Would benefit from PT/OT eval prior to DC  Ordered                  Consults called: none  Admission status:  ED Disposition     ED Disposition  [place]: [place] [[id number]]  Level of Care: Med-Surg [16]  Interfacility transfer: Yes  May place patient in observation at [facility] or [facility] if equivalent level of care is available:: No  Covid Evaluation: Confirmed COVID Positive  Diagnosis: COVID-19 virus infection [[id number]]  Admitting Physician: Dory Horn [[id number]]  Attending Physician: Dory Horn [[id number]]           Obs     Level of care     medical floor        Lab Results  Component Value Date   [place] (A) 10/26/2022     Precautions: admitted as  covid positive Airborne and Contact precautions    Kwasi Joung 10/26/2022, 11:30 PM    [facility]     after 2 AM please page floor coverage PA If 7AM-7PM, please contact the day team taking care of the patient using [email or web address]   Patient was evaluated in the context of the global COVID-19 pandemic, which necessitated  consideration that the patient might be at risk for infection with the SARS-CoV-2 virus that causes COVID-19. Institutional protocols and algorithms that pertain to the evaluation of patients at risk for COVID-19 are in a state of rapid change based on information released by regulatory bodies including the CDC and federal and state organizations. These policies and algorithms were followed during the patient's care.

## 2022-10-26 NOTE — Assessment & Plan Note (Signed)
Order sliding scale hold metfromin

## 2022-10-26 NOTE — Assessment & Plan Note (Signed)
Chronic stable Followed by  hepatology at [facility]

## 2022-10-26 NOTE — Assessment & Plan Note (Signed)
Chronic stable in the setting of history of cirrhosis

## 2022-10-26 NOTE — ED Notes (Signed)
Pt positive for covid today

## 2022-10-26 NOTE — Assessment & Plan Note (Signed)
Currently in remission

## 2022-10-26 NOTE — ED Triage Notes (Signed)
Pt via pov from home with AMS since last night. Pt's daughter reports that she thought it was a uti, as she has hx of same. [facility] in clinic found no UTI and sent her here. Pt alert & oriented to self, unable to tell day or year or where she is. Pt had brain bleed in December due to a fall. NAD noted.

## 2022-10-26 NOTE — ED Provider Notes (Signed)
77 year old female presents with her daughter for evaluation of altered mental status since last night.  Patient at the time of signout is awaiting callback from hospitalist.  Patient is COVID-positive.  Please see note from [facility].  Workup otherwise without evidence of UTI, acute intracranial pathology.  Sodium of 129 no other acute findings. Physical Exam  BP (!) 133/54   Pulse 77   Temp 98 F (36.7 C)   Resp (!) 23   Ht '5\' 2"'$  (1.575 m)   Wt 68.9 kg   SpO2 97%   BMI 27.78 kg/m     Procedures  Procedures  ED Course / MDM   Clinical Course as of 10/26/22 2032  Tue Oct 26, 2022  1809 Patient with positive home COVID test, she is having some coughing for the last 3 days, no significant respiratory distress. [CP]    Clinical Course User Index [CP] Anselmo Pickler, PA-C   Medical Decision Making Amount and/or Complexity of Data Reviewed Labs: ordered. Radiology: ordered.  Risk Decision regarding hospitalization.   Patient discussed with hospitalist will accept patient for admission.       Evlyn Courier, PA-C 10/26/22 2034    Regan Lemming, MD 10/26/22 2041

## 2022-10-26 NOTE — ED Provider Notes (Signed)
[facility] Provider Note   CSN: [id number] Arrival date & time: 10/26/22  1632     History {Add pertinent medical, surgical, social history, OB history to HPI:1} Chief Complaint  Patient presents with   Altered Mental Status    Amber Crosby is a 77 y.o. female with past medical history significant for hypertension, hyperlipidemia, diabetes, previous colon cancer, history of liver disease, portal hypertension, esophageal varices who presents with concern for altered mental status since last night.  Patient has no complaints today, but patient's daughter reports that he is having classic symptoms that she often has with UTI, she is alert and oriented to self, but not oriented to time, place, situation.  She did have a fall in December with a brain bleed, was stable at the time, she was discontinued on her Eliquis that she had been placed on for portal vein thrombosis.  Patient and daughter both deny any recent fall, injury, patient denies any abdominal pain, signs of jaundice, or any dark, tarry stool, or other bleeding.   Altered Mental Status      Home Medications Prior to Admission medications   Medication Sig Start Date End Date Taking? Authorizing Provider  alendronate (FOSAMAX) 70 MG tablet Take 70 mg by mouth once a week. Friday 03/11/22   [provider]  ALPRAZolam Duanne Moron) 0.25 MG tablet Take 1 tablet (0.25 mg total) by mouth at bedtime as needed for anxiety. 10/23/15   Darlin Coco, MD  amLODipine (NORVASC) 5 MG tablet Take 1 tablet (5 mg total) by mouth daily. NEED APPOINTMENT 08/16/22   Skeet Latch, MD  aspirin EC 81 MG tablet Take 81 mg by mouth daily. 09/16/22   [provider]  Cholecalciferol (VITAMIN D PO) Take 1 tablet by mouth daily.     [provider]  lactulose (CHRONULAC) 10 GM/15ML solution Take 15 mLs (10 g total) by mouth daily. 10/11/22   Mariel Aloe, MD  loperamide (IMODIUM  A-D) 2 MG tablet Take 1 tablet (2 mg total) by mouth 4 (four) times daily as needed for diarrhea or loose stools. 01/03/21   Fredia Sorrow, MD  losartan (COZAAR) 100 MG tablet Take 1 tablet (100 mg total) by mouth daily. 10/11/22 01/09/23  Mariel Aloe, MD  metFORMIN (GLUCOPHAGE) 500 MG tablet Take 1,000 mg by mouth See admin instructions. Taking 1000 mg in the AM and 500 mg in the evening    [provider]  nadolol (CORGARD) 40 MG tablet Take 120 mg by mouth daily. 3 tabs daily    [provider]  omeprazole (PRILOSEC) 20 MG capsule Take 20 mg by mouth daily as needed (GERD). 07/22/22   [provider]  ondansetron (ZOFRAN ODT) 4 MG disintegrating tablet Take 1 tablet (4 mg total) by mouth every 8 (eight) hours as needed for nausea or vomiting. 01/03/21   Fredia Sorrow, MD  ondansetron (ZOFRAN) 8 MG tablet Take 8 mg by mouth every 8 (eight) hours as needed for nausea or vomiting.    [provider]  rosuvastatin (CRESTOR) 10 MG tablet Take 10 mg by mouth daily. 09/19/22   [provider]      Allergies    Meperidine and related, Other, Amoxicillin-pot clavulanate, Contrast media [iodinated contrast media], Iohexol, and Morphine and related    Review of Systems   Review of Systems  Reason unable to perform ROS: AMS.  All other systems reviewed and are negative.   Physical Exam Updated  Vital Signs BP (!) 157/66 (BP Location: Right Arm)   Pulse 85   Temp 98 F (36.7 C)   Resp 15   Ht '5\' 2"'$  (1.575 m)   Wt 68.9 kg   SpO2 98%   BMI 27.78 kg/m  Physical Exam Vitals and nursing note reviewed.  Constitutional:      General: She is not in acute distress.    Appearance: Normal appearance.  HENT:     Head: Normocephalic and atraumatic.  Eyes:     General:        Right eye: No discharge.        Left eye: No discharge.  Cardiovascular:     Rate and Rhythm: Normal rate and regular rhythm.     Heart sounds: No murmur heard.    No  friction rub. No gallop.  Pulmonary:     Effort: Pulmonary effort is normal.     Breath sounds: Normal breath sounds.  Abdominal:     General: Bowel sounds are normal.     Palpations: Abdomen is soft.  Skin:    General: Skin is warm and dry.     Capillary Refill: Capillary refill takes less than 2 seconds.  Neurological:     Mental Status: She is alert.     Comments: Alert and oriented to self but not to time, place, situation.  She moves all 4 limbs spontaneously, CN II through XII grossly intact.  Psychiatric:        Mood and Affect: Mood normal.        Behavior: Behavior normal.     ED Results / Procedures / Treatments   Labs (all labs ordered are listed, but only abnormal results are displayed) Labs Reviewed  PROTIME-INR - Abnormal; Notable for the following components:      Result Value   Prothrombin Time 16.1 (*)    INR 1.3 (*)    All other components within normal limits  CBC - Abnormal; Notable for the following components:   Hemoglobin 11.2 (*)    HCT 33.7 (*)    Platelets 103 (*)    All other components within normal limits  DIFFERENTIAL - Abnormal; Notable for the following components:   Lymphs Abs 0.6 (*)    All other components within normal limits  APTT  COMPREHENSIVE METABOLIC PANEL  ETHANOL  AMMONIA  CBG MONITORING, ED    EKG None  Radiology No results found.  Procedures Procedures  {Document cardiac monitor, telemetry assessment procedure when appropriate:1}  Medications Ordered in ED Medications  sodium chloride flush (NS) 0.9 % injection 3 mL (has no administration in time range)    ED Course/ Medical Decision Making/ A&P Clinical Course as of 10/26/22 1809  Tue Oct 26, 2022  1809 Patient with positive home COVID test, she is having some coughing for the last 3 days, no significant respiratory distress. [CP]    Clinical Course User Index [CP] Izen Petz H, PA-C   {   Click here for ABCD2, HEART and other calculatorsREFRESH  Note before signing :1}                          Medical Decision Making Amount and/or Complexity of Data Reviewed Labs: ordered. Radiology: ordered.   Overall unremarkable workup other than mild ketones on urinalysis, she is mild hyponatremia as well with sodium 129, but this was her discharge sodium after having hyponatremia in the hospital recently with no mental status changes  at that time, overall low clinical suspicion for hyponatremia as the primary cause of her encephalopathy.  CBC overall unremarkable, mild anemia, hemoglobin 11.2, platelets at 103 are stable compared to her baseline.  No elevated ammonia, normal ethanol level.  Patient with home test positive for COVID this morning, likely having some viral encephalopathy, she will require admission for encephalopathy, if medications allow would be a good candidate for Paxlovid as symptoms just began on Sunday. Final Clinical Impression(s) / ED Diagnoses Final diagnoses:  None    Rx / DC Orders ED Discharge Orders     None

## 2022-10-26 NOTE — Assessment & Plan Note (Signed)
-   most likely multifactorial secondary to combination of  covid infection   mild dehydration secondary to decreased by mouth intake,     - Will rehydrate     - if no improvement may need further imaging to evaluate for CNS pathology pathology such as MRI of the brain   - neurological exam appears to be nonfocal but patient unable to cooperate fully   - VBG  ordered    -   history of liver disease but ammonia unremarkable

## 2022-10-26 NOTE — Plan of Care (Signed)
   Patient Name: Amber Crosby, Amber Crosby DOB: 08/26/1946 MRN: [id number] Transferring facility: [facility] Requesting provider: lawsing, md Reason for transfer: covid, AMS/encephalopathy 77 yo WF with hx of DM, HTN, cirrhosis, chronic hyponatremia with AMS x 2 days. CT head negative. UA negative. chronic hyponatremia 129. Positive for Covid Going to: [facility] Admission Status: observation Bed Type: med/surg To Do:  [facility] will assume care on arrival to accepting facility. Until arrival, medical decision making responsibilities remain with the EDP.  However, [facility] available 24/7 for questions and assistance.   Nursing staff please page [facility] and Consults [phone]) as soon as the patient arrives to the hospital.  Kristopher Oppenheim, DO [facility]

## 2022-10-26 NOTE — Assessment & Plan Note (Signed)
Sodium suspect to be in mid to high 120s-low 130s. Followed by liver doctor.  Likely secondary to cirrhosis

## 2022-10-26 NOTE — Assessment & Plan Note (Signed)
In December since then her Eliquis and aspirin has been on hold

## 2022-10-26 NOTE — Subjective & Objective (Signed)
Patient came from home with confusion since last night daughter for his first with urinary tract infection but when was checked at walk-in clinic no UTI was found that she was sent to emergency department.  There she found to be alert and oriented to self but unable to tell the date or year where she is. Patient with history of traumatic head injury and brain bleed in December due to a fall. While in emergency department tested positive for COVID CT head negative She has chronic hyponatremia with sodium down to 129

## 2022-10-26 NOTE — Assessment & Plan Note (Signed)
Allow permissive htn for now

## 2022-10-26 NOTE — ED Notes (Signed)
Report attempted... Floor called [phone] [facility] 2W... No answer.Marland KitchenMarland Kitchen

## 2022-10-27 DIAGNOSIS — E119 Type 2 diabetes mellitus without complications: Secondary | ICD-10-CM | POA: Diagnosis not present

## 2022-10-27 DIAGNOSIS — D72819 Decreased white blood cell count, unspecified: Secondary | ICD-10-CM | POA: Diagnosis not present

## 2022-10-27 DIAGNOSIS — Z85038 Personal history of other malignant neoplasm of large intestine: Secondary | ICD-10-CM | POA: Diagnosis not present

## 2022-10-27 DIAGNOSIS — J189 Pneumonia, unspecified organism: Secondary | ICD-10-CM | POA: Diagnosis present

## 2022-10-27 DIAGNOSIS — I10 Essential (primary) hypertension: Secondary | ICD-10-CM | POA: Diagnosis not present

## 2022-10-27 DIAGNOSIS — G9341 Metabolic encephalopathy: Secondary | ICD-10-CM | POA: Diagnosis not present

## 2022-10-27 DIAGNOSIS — U071 COVID-19: Secondary | ICD-10-CM | POA: Diagnosis not present

## 2022-10-27 DIAGNOSIS — Z79899 Other long term (current) drug therapy: Secondary | ICD-10-CM | POA: Diagnosis not present

## 2022-10-27 DIAGNOSIS — D649 Anemia, unspecified: Secondary | ICD-10-CM | POA: Diagnosis not present

## 2022-10-27 DIAGNOSIS — Z7982 Long term (current) use of aspirin: Secondary | ICD-10-CM | POA: Diagnosis not present

## 2022-10-27 DIAGNOSIS — D696 Thrombocytopenia, unspecified: Secondary | ICD-10-CM | POA: Diagnosis not present

## 2022-10-27 DIAGNOSIS — Z7984 Long term (current) use of oral hypoglycemic drugs: Secondary | ICD-10-CM | POA: Diagnosis not present

## 2022-10-27 LAB — CBC WITH DIFFERENTIAL/PLATELET
Abs Immature Granulocytes: 0.01 10*3/uL (ref 0.00–0.07)
Basophils Absolute: 0 10*3/uL (ref 0.0–0.1)
Basophils Relative: 1 %
Eosinophils Absolute: 0 10*3/uL (ref 0.0–0.5)
Eosinophils Relative: 0 %
HCT: 27.8 % — ABNORMAL LOW (ref 36.0–46.0)
Hemoglobin: 9.5 g/dL — ABNORMAL LOW (ref 12.0–15.0)
Immature Granulocytes: 0 %
Lymphocytes Relative: 21 %
Lymphs Abs: 0.6 10*3/uL — ABNORMAL LOW (ref 0.7–4.0)
MCH: 28.3 pg (ref 26.0–34.0)
MCHC: 34.2 g/dL (ref 30.0–36.0)
MCV: 82.7 fL (ref 80.0–100.0)
Monocytes Absolute: 0.6 10*3/uL (ref 0.1–1.0)
Monocytes Relative: 21 %
Neutro Abs: 1.5 10*3/uL — ABNORMAL LOW (ref 1.7–7.7)
Neutrophils Relative %: 57 %
Platelets: 74 10*3/uL — ABNORMAL LOW (ref 150–400)
RBC: 3.36 MIL/uL — ABNORMAL LOW (ref 3.87–5.11)
RDW: 15.1 % (ref 11.5–15.5)
WBC: 2.6 10*3/uL — ABNORMAL LOW (ref 4.0–10.5)
nRBC: 0 % (ref 0.0–0.2)

## 2022-10-27 LAB — COMPREHENSIVE METABOLIC PANEL
ALT: 30 U/L (ref 0–44)
AST: 36 U/L (ref 15–41)
Albumin: 3 g/dL — ABNORMAL LOW (ref 3.5–5.0)
Alkaline Phosphatase: 50 U/L (ref 38–126)
Anion gap: 12 (ref 5–15)
BUN: 11 mg/dL (ref 8–23)
CO2: 20 mmol/L — ABNORMAL LOW (ref 22–32)
Calcium: 8.6 mg/dL — ABNORMAL LOW (ref 8.9–10.3)
Chloride: 99 mmol/L (ref 98–111)
Creatinine, Ser: 0.53 mg/dL (ref 0.44–1.00)
GFR, Estimated: >60 mL/min (ref >60)
Glucose, Bld: 105 mg/dL — ABNORMAL HIGH (ref 70–99)
Potassium: 3.5 mmol/L (ref 3.5–5.1)
Sodium: 131 mmol/L — ABNORMAL LOW (ref 135–145)
Total Bilirubin: 2.4 mg/dL — ABNORMAL HIGH (ref 0.3–1.2)
Total Protein: 6.2 g/dL — ABNORMAL LOW (ref 6.5–8.1)

## 2022-10-27 LAB — FOLATE: Folate: 19.3 ng/mL (ref >5.9)

## 2022-10-27 LAB — IRON AND TIBC
Iron: 34 ug/dL (ref 28–170)
Saturation Ratios: 10 % — ABNORMAL LOW (ref 10.4–31.8)
TIBC: 330 ug/dL (ref 250–450)
UIBC: 296 ug/dL

## 2022-10-27 LAB — BLOOD GAS, VENOUS
Acid-base deficit: 0.9 mmol/L (ref 0.0–2.0)
Bicarbonate: 21.8 mmol/L (ref 20.0–28.0)
O2 Saturation: 95.3 %
Patient temperature: 36
pCO2, Ven: 29 mmHg — ABNORMAL LOW (ref 44–60)
pH, Ven: 7.49 — ABNORMAL HIGH (ref 7.25–7.43)
pO2, Ven: 58 mmHg — ABNORMAL HIGH (ref 32–45)

## 2022-10-27 LAB — GLUCOSE, CAPILLARY
Glucose-Capillary: 109 mg/dL — ABNORMAL HIGH (ref 70–99)
Glucose-Capillary: 106 mg/dL — ABNORMAL HIGH (ref 70–99)
Glucose-Capillary: 194 mg/dL — ABNORMAL HIGH (ref 70–99)

## 2022-10-27 LAB — T4, FREE: Free T4: 1.28 ng/dL — ABNORMAL HIGH (ref 0.61–1.12)

## 2022-10-27 LAB — PHOSPHORUS: Phosphorus: 3.2 mg/dL (ref 2.5–4.6)

## 2022-10-27 LAB — TSH: TSH: 0.273 u[IU]/mL — ABNORMAL LOW (ref 0.350–4.500)

## 2022-10-27 LAB — FERRITIN: Ferritin: 23 ng/mL (ref 11–307)

## 2022-10-27 LAB — VITAMIN B12: Vitamin B-12: 527 pg/mL (ref 180–914)

## 2022-10-27 LAB — MAGNESIUM: Magnesium: 1.8 mg/dL (ref 1.7–2.4)

## 2022-10-27 LAB — PROCALCITONIN: Procalcitonin: <0.1 ng/mL

## 2022-10-27 LAB — OSMOLALITY: Osmolality: 270 mOsm/kg — ABNORMAL LOW (ref 275–295)

## 2022-10-27 LAB — C-REACTIVE PROTEIN: CRP: 0.9 mg/dL (ref <1.0)

## 2022-10-27 LAB — LACTATE DEHYDROGENASE: LDH: 175 U/L (ref 98–192)

## 2022-10-27 MED ORDER — SODIUM CHLORIDE 0.9 % IV SOLN
2.0000 g | Freq: Every day | INTRAVENOUS | Status: DC
  Administered 2022-10-27: 2 g via INTRAVENOUS
  Filled 2022-10-27: qty 20

## 2022-10-27 MED ORDER — SODIUM CHLORIDE 0.9 % IV SOLN
500.0000 mg | Freq: Every day | INTRAVENOUS | Status: DC
  Administered 2022-10-27: 500 mg via INTRAVENOUS
  Filled 2022-10-27 (×2): qty 5

## 2022-10-27 NOTE — Assessment & Plan Note (Signed)
Obtain anemia panel denies any history of bleeding

## 2022-10-27 NOTE — Progress Notes (Signed)
PT educated on how to use inhaler. Pt daughter at bedside. Pt and family understands.

## 2022-10-27 NOTE — Assessment & Plan Note (Signed)
More likely viral  Check pro calcitonin For tonight cover with rocephin azithro  Await results of procalcitonin  Check strep pneumo  Sputum cult

## 2022-10-27 NOTE — Discharge Summary (Signed)
Physician Discharge Summary  Amber Crosby ESP:[id number] DOB: 04-14-1946 DOA: 10/26/2022  PCP: Antony Contras, MD  Admit date: 10/26/2022 Discharge date: 10/27/2022  Admitted From: Home Disposition: Home  Recommendations for Outpatient Follow-up:  Follow up with PCP in 1-2 weeks Follow-up with transplant team as scheduled  Home Health: None Equipment/Devices: None  Discharge Condition: Stable CODE STATUS: Full Diet recommendation: As tolerated  Brief/Interim Summary: Amber Crosby is a 77 y.o. female with medical history significant of DM2, cirrhosis due Chemotherapy UTI recently, chronic hyponatremia, recent head bleed, history of colon cancer portal hypertension esophageal varices history of portal vein thrombosis currently off of Eliquis, osteoporosis     Patient presented to the hospital with confusion, family presumed secondary to UTI and given her previous symptoms are consistent.  However her urinalysis was unremarkable at intake, she was however positive for COVID.  It appears her family has multiple new COVID-positive members who have been visiting each other.  At this time patient's mental status appears to have improved drastically with supportive care IV fluids.  Given viral infection will discontinue all antibiotics, no indication for COVID treatment given her symptoms have resolved and she is not hypoxic.  At this time she is otherwise stable and agreeable for discharge home.  Daughter at bedside is also agreeable.  Recommend close follow-up with PCP in the next 1 to 2 weeks to reevaluate her symptoms.  Also recommend very close follow-up with her transplant team at [facility] as scheduled.  Discharge Diagnoses:  Principal Problem:   COVID-19 virus infection Active Problems:   DM2 (diabetes mellitus, type 2) ([place])   Colon cancer ([place])   Portal hypertension with esophageal varices (HCC)   HTN (hypertension)   Hyperlipemia   Intracranial hemorrhage (HCC)   Normocytic anemia    Thrombocytopenia (HCC)   Acute metabolic encephalopathy   Chronic hyponatremia   History of cirrhosis of liver   CAP (community acquired pneumonia)   Leukopenia    Discharge Instructions   Allergies as of 10/27/2022       Reactions   Meperidine And Related Nausea And Vomiting   Other Rash, Hives   Amoxicillin-pot Clavulanate Other (See Comments)   Patient reports it caused a "sore throat"   Contrast Media [iodinated Contrast Media]    Pt can not have IV dye under any circumstances   Iohexol    Pt had reaction (hives) even with pre-meds, so patient can not have IV dye for any CT   Morphine And Related         Medication List     TAKE these medications    alendronate 70 MG tablet Commonly known as: FOSAMAX Take 70 mg by mouth once a week. Friday   ALPRAZolam 0.25 MG tablet Commonly known as: XANAX Take 1 tablet (0.25 mg total) by mouth at bedtime as needed for anxiety.   amLODipine 5 MG tablet Commonly known as: NORVASC Take 1 tablet (5 mg total) by mouth daily. NEED APPOINTMENT   aspirin EC 81 MG tablet Take 81 mg by mouth daily.   lactulose 10 GM/15ML solution Commonly known as: CHRONULAC Take 15 mLs (10 g total) by mouth daily.   loperamide 2 MG tablet Commonly known as: Imodium A-D Take 1 tablet (2 mg total) by mouth 4 (four) times daily as needed for diarrhea or loose stools.   losartan 100 MG tablet Commonly known as: COZAAR Take 1 tablet (100 mg total) by mouth daily.   metFORMIN 500 MG tablet Commonly known  as: GLUCOPHAGE Take 1,000 mg by mouth See admin instructions. Taking 1000 mg in the AM and 500 mg in the evening   nadolol 40 MG tablet Commonly known as: CORGARD Take 120 mg by mouth daily. 3 tabs daily   omeprazole 20 MG capsule Commonly known as: PRILOSEC Take 20 mg by mouth daily as needed (GERD).   ondansetron 4 MG disintegrating tablet Commonly known as: Zofran ODT Take 1 tablet (4 mg total) by mouth every 8 (eight) hours as  needed for nausea or vomiting.   ondansetron 8 MG tablet Commonly known as: ZOFRAN Take 8 mg by mouth every 8 (eight) hours as needed for nausea or vomiting.   rosuvastatin 10 MG tablet Commonly known as: CRESTOR Take 10 mg by mouth daily.   VITAMIN D PO Take 1 tablet by mouth daily.        Allergies  Allergen Reactions   Meperidine And Related Nausea And Vomiting   Other Rash and Hives   Amoxicillin-Pot Clavulanate Other (See Comments)    Patient reports it caused a "sore throat"   Contrast Media [Iodinated Contrast Media]     Pt can not have IV dye under any circumstances   Iohexol     Pt had reaction (hives) even with pre-meds, so patient can not have IV dye for any CT    Morphine And Related     Consultations: None  Procedures/Studies: DG CHEST PORT 1 VIEW  Result Date: 10/26/2022 CLINICAL DATA:  COVID-19. EXAM: PORTABLE CHEST 1 VIEW COMPARISON:  Chest radiograph dated 10/09/2022. FINDINGS: Bilateral interstitial nodularity may represent atypical pneumonia. Edema is not excluded. Clinical correlation recommended. No focal consolidation, pleural effusion, or pneumothorax. Stable cardiac silhouette. Atherosclerotic calcification of the aorta. No acute osseous pathology. IMPRESSION: Bilateral interstitial nodularity may represent atypical pneumonia. Electronically Signed   By: Anner Crete M.D.   On: 10/26/2022 23:41   CT HEAD WO CONTRAST  Result Date: 10/26/2022 CLINICAL DATA:  Altered mental status EXAM: CT HEAD WITHOUT CONTRAST TECHNIQUE: Contiguous axial images were obtained from the base of the skull through the vertex without intravenous contrast. RADIATION DOSE REDUCTION: This exam was performed according to the departmental dose-optimization program which includes automated exposure control, adjustment of the mA and/or kV according to patient size and/or use of iterative reconstruction technique. COMPARISON:  CT brain 10/06/2022 FINDINGS: Brain: No acute  territorial infarction, hemorrhage or intracranial mass. Advanced white matter hypodensity. Stable ventricle size. Vascular: No hyperdense vessels. Carotid and vertebral vascular calcification Skull: Normal. Negative for fracture or focal lesion. Sinuses/Orbits: Opacified right sphenoid sinus with hyperdense secretions. Other: None IMPRESSION: 1. No CT evidence for acute intracranial abnormality. 2. Atrophy and chronic small vessel ischemic changes of the white matter. Electronically Signed   By: Donavan Foil M.D.   On: 10/26/2022 17:34   DG Chest 2 View  Result Date: 10/09/2022 CLINICAL DATA:  Cough. EXAM: CHEST - 2 VIEW COMPARISON:  07/02/2022 FINDINGS: Cardiopericardial silhouette is at upper limits of normal for size. Diffuse interstitial opacity with alveolar/nodular airspace disease in the mid and lower lungs. Tiny bilateral pleural effusions. The visualized bony structures of the thorax are unremarkable. IMPRESSION: Diffuse interstitial opacity with alveolar/nodular airspace disease in the mid and lower lungs. Imaging features suggest pulmonary edema although multifocal pneumonia could have this appearance. Chest CT may prove helpful to further evaluate. Electronically Signed   By: Misty Stanley M.D.   On: 10/09/2022 17:12   CT Head Wo Contrast  Result Date: 10/06/2022 CLINICAL DATA:  Hemorrhage identified on CT earlier today. EXAM: CT HEAD WITHOUT CONTRAST TECHNIQUE: Contiguous axial images were obtained from the base of the skull through the vertex without intravenous contrast. RADIATION DOSE REDUCTION: This exam was performed according to the departmental dose-optimization program which includes automated exposure control, adjustment of the mA and/or kV according to patient size and/or use of iterative reconstruction technique. COMPARISON:  10/05/2022.  09/07/2022 . FINDINGS: Brain: Stable hyperattenuation in sulci of the high left frontal region comparing to exam from earlier today but decreased  comparing to 09/07/2022. No evidence for interval rebleeding since the study from earlier today. Diffuse loss of parenchymal volume is consistent with atrophy. Patchy low attenuation in the deep hemispheric and periventricular white matter is nonspecific, but likely reflects chronic microvascular ischemic demyelination. No findings to suggest acute infarct. Vascular: No hyperdense vessel or unexpected calcification. Skull: No evidence for fracture. No worrisome lytic or sclerotic lesion. Sinuses/Orbits: Chronic opacification right sphenoid sinus again noted with mucosal disease in the left sphenoid sinus and posterior ethmoid air cells. Mastoid air cells are clear bilaterally. Visualized portions of the globes and intraorbital fat are unremarkable. Other: None. IMPRESSION: 1. Stable hyperattenuation in sulci of the high left frontal region comparing to exam from earlier today but decreased comparing to 09/07/2022. No evidence for interval further bleeding since the study from earlier today. Findings are likely residual from the blood products identified on the 09/07/2022 exam although, technically, trace interval rebleeding between the 09/17/2022 exam and the CT scan from earlier today cannot be completely excluded. 2. Atrophy with chronic small vessel ischemic disease. 3. Chronic paranasal sinus disease. Electronically Signed   By: Misty Stanley M.D.   On: 10/06/2022 06:56   CT Head Wo Contrast  Addendum Date: 10/06/2022   ADDENDUM REPORT: 10/05/2022 23:23 ADDENDUM: These results were called by telephone at the time of interpretation on 10/05/2022 at 11:23 pm to provider Dr. Stark Jock, who verbally acknowledged these results. Electronically Signed   By: Iven Finn M.D.   On: 10/05/2022 23:23   Result Date: 10/05/2022 CLINICAL DATA:  Mental status change, unknown cause EXAM: CT HEAD WITHOUT CONTRAST TECHNIQUE: Contiguous axial images were obtained from the base of the skull through the vertex without  intravenous contrast. RADIATION DOSE REDUCTION: This exam was performed according to the departmental dose-optimization program which includes automated exposure control, adjustment of the mA and/or kV according to patient size and/or use of iterative reconstruction technique. COMPARISON:  CT head 09/07/2022 FINDINGS: Brain: Patchy and confluent areas of decreased attenuation are noted throughout the deep and periventricular white matter of the cerebral hemispheres bilaterally, compatible with chronic microvascular ischemic disease. No evidence of large-territorial acute infarction. No parenchymal hemorrhage. No mass lesion. Trace curvilinear hyperdensity along the left frontal lobe sulci (2:20 1-23) in the same location as prior subarachnoid hemorrhage dating 09/07/2022. No mass effect or midline shift. No hydrocephalus. Basilar cisterns are patent. Vascular: No hyperdense vessel. Skull: No acute fracture or focal lesion. Sinuses/Orbits: Complete opacification of the right sphenoid sinus with associated sphenoid wall hypertrophy consistent with chronic sinusitis. Mucosal thickening of the left sphenoid sinus and bilateral ethmoid sinuses. Paranasal sinuses and mastoid air cells are clear. The orbits are unremarkable. Other: None. IMPRESSION: Trace curvilinear hyperdensity along the left frontal lobe sulci. Finding could represent an acute subarachnoid hemorrhage versus residual of prior bleed on 09/07/2022. Recommend repeat CT in 6 hours to evaluate for evolution/stability. Electronically Signed: By: Iven Finn M.D. On: 10/05/2022 23:18     Subjective: No  acute issues or events overnight, patient is now back to baseline alert oriented and requesting discharge home which is certainly reasonable given her rapid resolution of symptoms.   Discharge Exam: Vitals:   10/27/22 0438 10/27/22 0853  BP: (!) 122/58 (!) 131/59  Pulse: 75 73  Resp: 18 18  Temp: 98.9 F (37.2 C) 98.3 F (36.8 C)  SpO2: 95% 99%    Vitals:   10/26/22 2300 10/27/22 0114 10/27/22 0438 10/27/22 0853  BP:   (!) 122/58 (!) 131/59  Pulse:  73 75 73  Resp:  '18 18 18  '$ Temp:   98.9 F (37.2 C) 98.3 F (36.8 C)  TempSrc:   Oral Oral  SpO2: 95% 98% 95% 99%  Weight:      Height:        General: Pt is alert, awake, not in acute distress Cardiovascular: RRR, S1/S2 +, no rubs, no gallops Respiratory: CTA bilaterally, no wheezing, no rhonchi Abdominal: Soft, NT, ND, bowel sounds + Extremities: no edema, no cyanosis    The results of significant diagnostics from this hospitalization (including imaging, microbiology, ancillary and laboratory) are listed below for reference.     Microbiology: Recent Results (from the past 240 hour(s))  Resp panel by RT-PCR (RSV, Flu A&B, Covid) Anterior Nasal Swab     Status: Abnormal   Collection Time: 10/26/22  6:16 PM   Specimen: Anterior Nasal Swab  Result Value Ref Range Status   SARS Coronavirus 2 by RT PCR POSITIVE (A) NEGATIVE Final    Comment: (NOTE) SARS-CoV-2 target nucleic acids are DETECTED.  The SARS-CoV-2 RNA is generally detectable in upper respiratory specimens during the acute phase of infection. Positive results are indicative of the presence of the identified virus, but do not rule out bacterial infection or co-infection with other pathogens not detected by the test. Clinical correlation with patient history and other diagnostic information is necessary to determine patient infection status. The expected result is Negative.  Fact Sheet for Patients: EntrepreneurPulse.com.au  Fact Sheet for Healthcare Providers: IncredibleEmployment.be  This test is not yet approved or cleared by the [place] FDA and  has been authorized for detection and/or diagnosis of SARS-CoV-2 by FDA under an Emergency Use Authorization (EUA).  This EUA will remain in effect (meaning this test can be used) for the duration of  the COVID-19  declaration under Section 564(b)(1) of the A ct, 21 U.S.C. section 360bbb-3(b)(1), unless the authorization is terminated or revoked sooner.     Influenza A by PCR NEGATIVE NEGATIVE Final   Influenza B by PCR NEGATIVE NEGATIVE Final    Comment: (NOTE) The Xpert Xpress SARS-CoV-2/FLU/RSV plus assay is intended as an aid in the diagnosis of influenza from Nasopharyngeal swab specimens and should not be used as a sole basis for treatment. Nasal washings and aspirates are unacceptable for Xpert Xpress SARS-CoV-2/FLU/RSV testing.  Fact Sheet for Patients: EntrepreneurPulse.com.au  Fact Sheet for Healthcare Providers: IncredibleEmployment.be  This test is not yet approved or cleared by the [place] FDA and has been authorized for detection and/or diagnosis of SARS-CoV-2 by FDA under an Emergency Use Authorization (EUA). This EUA will remain in effect (meaning this test can be used) for the duration of the COVID-19 declaration under Section 564(b)(1) of the Act, 21 U.S.C. section 360bbb-3(b)(1), unless the authorization is terminated or revoked.     Resp Syncytial Virus by PCR NEGATIVE NEGATIVE Final    Comment: (NOTE) Fact Sheet for Patients: EntrepreneurPulse.com.au  Fact Sheet for Healthcare Providers: IncredibleEmployment.be  This test is not yet approved or cleared by the [place] and has been authorized for detection and/or diagnosis of SARS-CoV-2 by FDA under an Emergency Use Authorization (EUA). This EUA will remain in effect (meaning this test can be used) for the duration of the COVID-19 declaration under Section 564(b)(1) of the Act, 21 U.S.C. section 360bbb-3(b)(1), unless the authorization is terminated or revoked.  Performed at [facility], [address] [place], [place] [address]      Labs: BNP (last 3 results) No results for input(s): "BNP" in the  last 8760 hours. Basic Metabolic Panel: Recent Labs  Lab 10/26/22 1643 10/26/22 2335  NA 129* 131*  K 4.0 3.5  CL 96* 99  CO2 22 20*  GLUCOSE 129* 105*  BUN 12 11  CREATININE 0.45 0.53  CALCIUM 9.7 8.6*  MG  --  1.8  PHOS  --  3.2   Liver Function Tests: Recent Labs  Lab 10/26/22 1643 10/26/22 2335  AST 35 36  ALT 31 30  ALKPHOS 53 50  BILITOT 2.8* 2.4*  PROT 7.8 6.2*  ALBUMIN 4.0 3.0*   No results for input(s): "LIPASE", "AMYLASE" in the last 168 hours. Recent Labs  Lab 10/26/22 1647  AMMONIA 34   CBC: Recent Labs  Lab 10/26/22 1643 10/26/22 2335  WBC 4.0 2.6*  NEUTROABS 2.5 1.5*  HGB 11.2* 9.5*  HCT 33.7* 27.8*  MCV 83.2 82.7  PLT 103* 74*   Cardiac Enzymes: No results for input(s): "CKTOTAL", "CKMB", "CKMBINDEX", "TROPONINI" in the last 168 hours. BNP: Invalid input(s): "POCBNP" CBG: Recent Labs  Lab 10/26/22 1750 10/26/22 2330 10/27/22 0435 10/27/22 0851 10/27/22 0948  GLUCAP 124* 103* 109* 106* 194*   D-Dimer No results for input(s): "DDIMER" in the last 72 hours. Hgb A1c No results for input(s): "HGBA1C" in the last 72 hours. Lipid Profile No results for input(s): "CHOL", "HDL", "LDLCALC", "TRIG", "CHOLHDL", "LDLDIRECT" in the last 72 hours. Thyroid function studies Recent Labs    10/26/22 2322  TSH 0.273*   Anemia work up Recent Labs    10/26/22 2322  VITAMINB12 527  FOLATE 19.3  FERRITIN 23  TIBC 330  IRON 34  RETICCTPCT 2.1   Urinalysis    Component Value Date/Time   COLORURINE YELLOW 10/26/2022 1815   APPEARANCEUR CLEAR 10/26/2022 1815   LABSPEC 1.026 10/26/2022 1815   PHURINE 6.0 10/26/2022 1815   GLUCOSEU NEGATIVE 10/26/2022 1815   HGBUR NEGATIVE 10/26/2022 1815   BILIRUBINUR NEGATIVE 10/26/2022 1815   BILIRUBINUR neg 04/20/2014 1007   KETONESUR 15 (A) 10/26/2022 1815   PROTEINUR TRACE (A) 10/26/2022 1815   UROBILINOGEN 0.2 04/20/2014 1007   NITRITE NEGATIVE 10/26/2022 1815   LEUKOCYTESUR TRACE (A)  10/26/2022 1815   Sepsis Labs Recent Labs  Lab 10/26/22 1643 10/26/22 2335  WBC 4.0 2.6*   Microbiology Recent Results (from the past 240 hour(s))  Resp panel by RT-PCR (RSV, Flu A&B, Covid) Anterior Nasal Swab     Status: Abnormal   Collection Time: 10/26/22  6:16 PM   Specimen: Anterior Nasal Swab  Result Value Ref Range Status   SARS Coronavirus 2 by RT PCR POSITIVE (A) NEGATIVE Final    Comment: (NOTE) SARS-CoV-2 target nucleic acids are DETECTED.  The SARS-CoV-2 RNA is generally detectable in upper respiratory specimens during the acute phase of infection. Positive results are indicative of the presence of the identified virus, but do not rule out bacterial infection or co-infection with other pathogens not detected by the  test. Clinical correlation with patient history and other diagnostic information is necessary to determine patient infection status. The expected result is Negative.  Fact Sheet for Patients: EntrepreneurPulse.com.au  Fact Sheet for Healthcare Providers: IncredibleEmployment.be  This test is not yet approved or cleared by the [place] FDA and  has been authorized for detection and/or diagnosis of SARS-CoV-2 by FDA under an Emergency Use Authorization (EUA).  This EUA will remain in effect (meaning this test can be used) for the duration of  the COVID-19 declaration under Section 564(b)(1) of the A ct, 21 U.S.C. section 360bbb-3(b)(1), unless the authorization is terminated or revoked sooner.     Influenza A by PCR NEGATIVE NEGATIVE Final   Influenza B by PCR NEGATIVE NEGATIVE Final    Comment: (NOTE) The Xpert Xpress SARS-CoV-2/FLU/RSV plus assay is intended as an aid in the diagnosis of influenza from Nasopharyngeal swab specimens and should not be used as a sole basis for treatment. Nasal washings and aspirates are unacceptable for Xpert Xpress SARS-CoV-2/FLU/RSV testing.  Fact Sheet for  Patients: EntrepreneurPulse.com.au  Fact Sheet for Healthcare Providers: IncredibleEmployment.be  This test is not yet approved or cleared by the [place] FDA and has been authorized for detection and/or diagnosis of SARS-CoV-2 by FDA under an Emergency Use Authorization (EUA). This EUA will remain in effect (meaning this test can be used) for the duration of the COVID-19 declaration under Section 564(b)(1) of the Act, 21 U.S.C. section 360bbb-3(b)(1), unless the authorization is terminated or revoked.     Resp Syncytial Virus by PCR NEGATIVE NEGATIVE Final    Comment: (NOTE) Fact Sheet for Patients: EntrepreneurPulse.com.au  Fact Sheet for Healthcare Providers: IncredibleEmployment.be  This test is not yet approved or cleared by the [place] FDA and has been authorized for detection and/or diagnosis of SARS-CoV-2 by FDA under an Emergency Use Authorization (EUA). This EUA will remain in effect (meaning this test can be used) for the duration of the COVID-19 declaration under Section 564(b)(1) of the Act, 21 U.S.C. section 360bbb-3(b)(1), unless the authorization is terminated or revoked.  Performed at [facility], [address] [place], [place] [address]      Time coordinating discharge: Over 30 minutes  SIGNED:   Little Ishikawa, DO [facility] 10/27/2022, 2:06 PM Pager   If 7PM-7AM, please contact night-coverage [email or web address]

## 2022-10-27 NOTE — Assessment & Plan Note (Signed)
Suspect viral  Continue to follow

## 2022-10-27 NOTE — Progress Notes (Signed)
Pt arrived to floor

## 2022-10-27 NOTE — Progress Notes (Signed)
Pt was not able to cough up enough to collect for a respiratory culture. Collection cup left in the room and pt instructed to drop a sputum sample into the cup whenever possible.

## 2022-10-28 LAB — T3: T3, Total: 86 ng/dL (ref 71–180)

## 2022-10-28 LAB — URINE CULTURE: Culture: 50000 — AB

## 2022-11-08 DIAGNOSIS — R8290 Unspecified abnormal findings in urine: Secondary | ICD-10-CM | POA: Diagnosis not present

## 2022-11-08 DIAGNOSIS — Z87440 Personal history of urinary (tract) infections: Secondary | ICD-10-CM | POA: Diagnosis not present

## 2022-11-08 DIAGNOSIS — D72819 Decreased white blood cell count, unspecified: Secondary | ICD-10-CM | POA: Diagnosis not present

## 2022-11-08 DIAGNOSIS — I10 Essential (primary) hypertension: Secondary | ICD-10-CM | POA: Diagnosis not present

## 2022-11-24 DIAGNOSIS — N390 Urinary tract infection, site not specified: Secondary | ICD-10-CM | POA: Diagnosis not present

## 2022-12-14 DIAGNOSIS — N390 Urinary tract infection, site not specified: Secondary | ICD-10-CM | POA: Diagnosis not present

## 2022-12-25 DIAGNOSIS — K7460 Unspecified cirrhosis of liver: Secondary | ICD-10-CM | POA: Diagnosis not present

## 2022-12-25 DIAGNOSIS — I81 Portal vein thrombosis: Secondary | ICD-10-CM | POA: Diagnosis not present

## 2022-12-25 DIAGNOSIS — K769 Liver disease, unspecified: Secondary | ICD-10-CM | POA: Diagnosis not present

## 2022-12-31 DIAGNOSIS — D485 Neoplasm of uncertain behavior of skin: Secondary | ICD-10-CM | POA: Diagnosis not present

## 2022-12-31 DIAGNOSIS — C4442 Squamous cell carcinoma of skin of scalp and neck: Secondary | ICD-10-CM | POA: Diagnosis not present

## 2022-12-31 DIAGNOSIS — L821 Other seborrheic keratosis: Secondary | ICD-10-CM | POA: Diagnosis not present

## 2023-01-14 DIAGNOSIS — N3020 Other chronic cystitis without hematuria: Secondary | ICD-10-CM | POA: Diagnosis not present

## 2023-02-04 DIAGNOSIS — K7460 Unspecified cirrhosis of liver: Secondary | ICD-10-CM | POA: Diagnosis not present

## 2023-02-08 DIAGNOSIS — C4442 Squamous cell carcinoma of skin of scalp and neck: Secondary | ICD-10-CM | POA: Diagnosis not present

## 2023-02-11 DIAGNOSIS — R600 Localized edema: Secondary | ICD-10-CM | POA: Diagnosis not present

## 2023-02-11 DIAGNOSIS — E119 Type 2 diabetes mellitus without complications: Secondary | ICD-10-CM | POA: Diagnosis not present

## 2023-02-11 DIAGNOSIS — Z87440 Personal history of urinary (tract) infections: Secondary | ICD-10-CM | POA: Diagnosis not present

## 2023-02-11 DIAGNOSIS — E871 Hypo-osmolality and hyponatremia: Secondary | ICD-10-CM | POA: Diagnosis not present

## 2023-02-11 DIAGNOSIS — K7460 Unspecified cirrhosis of liver: Secondary | ICD-10-CM | POA: Diagnosis not present

## 2023-02-11 DIAGNOSIS — E7800 Pure hypercholesterolemia, unspecified: Secondary | ICD-10-CM | POA: Diagnosis not present

## 2023-02-17 ENCOUNTER — Ambulatory Visit (HOSPITAL_BASED_OUTPATIENT_CLINIC_OR_DEPARTMENT_OTHER): Payer: Medicare Other | Admitting: Family

## 2023-02-17 ENCOUNTER — Encounter (HOSPITAL_BASED_OUTPATIENT_CLINIC_OR_DEPARTMENT_OTHER): Payer: Self-pay | Admitting: Family

## 2023-02-17 VITALS — BP 122/72 | HR 70 | Ht 62.0 in | Wt 146.0 lb

## 2023-02-17 DIAGNOSIS — I81 Portal vein thrombosis: Secondary | ICD-10-CM

## 2023-02-17 DIAGNOSIS — I119 Hypertensive heart disease without heart failure: Secondary | ICD-10-CM | POA: Diagnosis not present

## 2023-02-17 DIAGNOSIS — E7800 Pure hypercholesterolemia, unspecified: Secondary | ICD-10-CM | POA: Diagnosis not present

## 2023-02-17 MED ORDER — HYDROCHLOROTHIAZIDE 12.5 MG PO TABS: 12.5000 mg | ORAL_TABLET | Freq: Every day | ORAL | 0 refills | Status: DC

## 2023-02-17 NOTE — Patient Instructions (Addendum)
Medication Instructions:  Your physician has recommended you make the following change in your medication:   START Hydrochlorothiazide 12.5mg  daily  *If you need a refill on your cardiac medications before your next appointment, please call your pharmacy*  Labs: Your physician recommends that you return for lab work in 7-10 days for [facility]  Please return for Lab work. You may come to the...   [facility] (3rd floor) [address], [place], [place] [address]  Open: 8am-Noon and 1pm-4:30pm  Please ring the doorbell on the small table when you exit the elevator and the Lab Tech will come get you  [facility] [address], [place], [place] [address] Open: 8am-1pm, then 2pm-4:30pm   [facility]- Please see attached locations sheet stapled to your lab work with address and hours.    Follow-Up: At [facility], you and your health needs are our priority.  As part of our continuing mission to provide you with exceptional heart care, we have created designated Provider Care Teams.  These Care Teams include your primary Cardiologist (physician) and Advanced Practice Providers (APPs -  Physician Assistants and Nurse Practitioners) who all work together to provide you with the care you need, when you need it.  We recommend signing up for the patient portal called "MyChart".  Sign up information is provided on this After Visit Summary.  MyChart is used to connect with patients for Virtual Visits (Telemedicine).  Patients are able to view lab/test results, encounter notes, upcoming appointments, etc.  Non-urgent messages can be sent to your provider as well.   To learn more about what you can do with MyChart, go to [email or web address].    Your next appointment:   1 year(s)  Provider:   Chilton Si, MD or Gillian Shields, NP    Other Instructions:  Check blood pressure 3-4 times per week. We will check in after your lab  work and decide whether to continue the Hydrochlorothiazide.  If your blood pressure is persistently well controlled we may trial off the Amlodipine to try to simplify your medication regimen.

## 2023-02-17 NOTE — Progress Notes (Signed)
Office Visit    Patient Name: Amber Crosby Date of Encounter: 02/17/2023  PCP:  Tally Joe, MD   [facility]  Cardiologist:  Chilton Si, MD  Advanced Practice Provider:  No care team member to display Electrophysiologist:  None      Chief Complaint    Amber Crosby is a 77 y.o. female presents today for hypertension follow up    Past Medical History    Past Medical History:  Diagnosis Date   Anxiety    Colon cancer [facility])    Without evidence of recurrence   Diabetes mellitus    Type 2   Ear infection    Essential hypertension    Hemorrhoids    Internal-external small hemorrhoids   Hypercholesterolemia    Portal vein thrombosis 01/29/2020   Past Surgical History:  Procedure Laterality Date   ESOPHAGOGASTRODUODENOSCOPY   08/04/2010   VAGINAL HYSTERECTOMY  1979    Allergies  Allergies  Allergen Reactions   Meperidine And Related Nausea And Vomiting   Other Rash and Hives   Amoxicillin-Pot Clavulanate Other (See Comments)    Patient reports it caused a "sore throat"   Contrast Media [Iodinated Contrast Media]     Pt can not have IV dye under any circumstances   Iohexol     Pt had reaction (hives) even with pre-meds, so patient can not have IV dye for any CT    Morphine And Codeine     History of Present Illness    Amber Crosby is a 77 y.o. 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 Duke Salvia. She is a prior patient of Dr. Patty Sermons.   Follows with [facility] liver team with stable portal vein thrombosis. Since last seen 09/2022 admission with subsrachnoid hemorrhage post fall as well as UTI. Her Eliquis was discontinued. Admission 10/2022 with COVID19 treated with supportive care and IV fluids.   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 Enjoys participating in her Bible study. Lives at home with her husband of 59 years. Since last seen her husband has had multiple health issues and is presently on home hospice with Alzheimers. Her oldest daughter is residing with them to support them. Bothered by weakness of left leg secondary to prior chemotherpay. Osteoporosis in bilateral arms which limits physical activity. Her granddaughter works in physical therapy and has written her out a plan. Does note her LE edema has been more bothersome since HCTZ was discontinued.   EKGs/Labs/Other Studies Reviewed:   The following studies were reviewed today:      EKG:  EKG is not ordered today.    Recent Labs: 10/26/2022: ALT 30; BUN 11; Creatinine, Ser 0.53; Hemoglobin 9.5; Magnesium 1.8; Platelets 74; Potassium 3.5; Sodium 131; TSH 0.273  Recent Lipid Panel    Component Value Date/Time   CHOL 129 03/03/2018 0926   TRIG 47 03/03/2018 0926   HDL 60 03/03/2018 0926   CHOLHDL 2.2 03/03/2018 0926   CHOLHDL 2.8 05/18/2016 1029   VLDL 13 05/18/2016 1029   LDLCALC 60 03/03/2018 0926     Home Medications   Current Meds  Medication Sig   alendronate (FOSAMAX) 70 MG tablet Take 70 mg by mouth once a week. Friday   ALPRAZolam (XANAX) 0.25 MG tablet Take 1 tablet (0.25 mg total)  by mouth at bedtime as needed for anxiety.   amLODipine (NORVASC) 5 MG tablet Take 1 tablet (5 mg total) by mouth daily. NEED APPOINTMENT   aspirin EC 81 MG tablet Take 81 mg by mouth daily.   AZO D-MANNOSE PO Take 4 capsules by mouth daily at 6 (six) AM.   Cholecalciferol (VITAMIN D PO) Take 1 tablet by mouth daily.    cyanocobalamin (VITAMIN B12) 1000 MCG tablet Take 1,000 mcg by mouth daily.   estradiol (ESTRACE) 0.1 MG/GM vaginal cream Place 1 Applicatorful vaginally 2 (two) times a week.   ferrous sulfate 325 (65 FE) MG tablet Take 325 mg by mouth daily with breakfast.   lactulose (CHRONULAC) 10 GM/15ML solution Take 15  mLs (10 g total) by mouth daily.   metformin (FORTAMET) 500 MG (OSM) 24 hr tablet Take 500 mg by mouth daily with breakfast.   nadolol (CORGARD) 40 MG tablet Take 120 mg by mouth daily. 3 tabs daily   ondansetron (ZOFRAN ODT) 4 MG disintegrating tablet Take 1 tablet (4 mg total) by mouth every 8 (eight) hours as needed for nausea or vomiting.   rifaximin (XIFAXAN) 550 MG TABS tablet Take 550 mg by mouth 2 (two) times daily.   rosuvastatin (CRESTOR) 10 MG tablet Take 10 mg by mouth daily.   trimethoprim (TRIMPEX) 100 MG tablet Take 100 mg by mouth at bedtime.     Review of Systems      All other systems reviewed and are otherwise negative except as noted above.  Physical Exam    VS:  BP 122/72   Pulse 70   Ht 5\' 2"  (1.575 m)   Wt 146 lb (66.2 kg)   BMI 26.70 kg/m  , BMI Body mass index is 26.7 kg/m.  Wt Readings from Last 3 Encounters:  02/17/23 146 lb (66.2 kg)  10/26/22 151 lb 14.4 oz (68.9 kg)  10/11/22 151 lb 12.8 oz (68.9 kg)     GEN: Well nourished, well developed, in no acute distress. HEENT: normal. Neck: Supple, no JVD, carotid bruits, or masses. Cardiac: RRR, no murmurs, rubs, or gallops. No clubbing, cyanosis, edema.  Radials/PT 2+ and equal bilaterally.  Respiratory:  Respirations regular and unlabored, clear to auscultation bilaterally. GI: Soft, nontender, nondistended. MS: No deformity or atrophy. Skin: Warm and dry, no rash. Neuro:  Strength and sensation are intact. Psych: Normal affect.  Assessment & Plan    Hypertensive heart disease without heart disease - BP well controlled. Continue Losartan 100mg  QD, Amlodipine 5mg  QD. Notes HCTZ was discontinued during prior admission due to hyponatremia preciptated by UTI, dehydration. Notes daily LE edema. Will trial HCTZ 12.5mg  QD with BMP in1 -2 weeks for monitoring. I do anticipate if we are able to continue the HCTZ we may be able to reduce or stop her Amlodipine as BP routinely 110s-120s at home.   HLD -  10/2022 LDL 95. Continue Rosuvastatin 10mg  daily.   Portal vein thrombosis - OAC prior discontinued due to brain bleed. Per her discussion with her liver team at [facility] thrombosis has been stable.        Disposition: Follow up in 1 year(s) with Chilton Si, MD or APP.  Signed, Alver Sorrow, NP 02/17/2023, 4:07 PM [facility]

## 2023-02-20 ENCOUNTER — Encounter (HOSPITAL_BASED_OUTPATIENT_CLINIC_OR_DEPARTMENT_OTHER): Payer: Self-pay | Admitting: Family

## 2023-02-25 DIAGNOSIS — I81 Portal vein thrombosis: Secondary | ICD-10-CM | POA: Diagnosis not present

## 2023-02-25 DIAGNOSIS — I119 Hypertensive heart disease without heart failure: Secondary | ICD-10-CM | POA: Diagnosis not present

## 2023-02-25 DIAGNOSIS — E7800 Pure hypercholesterolemia, unspecified: Secondary | ICD-10-CM | POA: Diagnosis not present

## 2023-02-26 LAB — BASIC METABOLIC PANEL
BUN/Creatinine Ratio: 24 (ref 12–28)
BUN: 16 mg/dL (ref 8–27)
CO2: 25 mmol/L (ref 20–29)
Calcium: 9.9 mg/dL (ref 8.7–10.3)
Chloride: 97 mmol/L (ref 96–106)
Creatinine, Ser: 0.68 mg/dL (ref 0.57–1.00)
Glucose: 260 mg/dL — ABNORMAL HIGH (ref 70–99)
Potassium: 5.3 mmol/L — ABNORMAL HIGH (ref 3.5–5.2)
Sodium: 134 mmol/L (ref 134–144)
eGFR: 90 mL/min/{1.73_m2} (ref >59)

## 2023-02-27 ENCOUNTER — Encounter (HOSPITAL_BASED_OUTPATIENT_CLINIC_OR_DEPARTMENT_OTHER): Payer: Self-pay

## 2023-02-28 ENCOUNTER — Telehealth (HOSPITAL_BASED_OUTPATIENT_CLINIC_OR_DEPARTMENT_OTHER): Payer: Self-pay

## 2023-02-28 DIAGNOSIS — E875 Hyperkalemia: Secondary | ICD-10-CM

## 2023-02-28 NOTE — Telephone Encounter (Addendum)
Seen by patient Amber Crosby on 02/27/2023  5:28 PM; follow up mychart message sent to patient.   ----- Message from Alver Sorrow, NP sent at 02/27/2023  4:00 PM EDT ----- Blood sugar very elevated, ensure following closely with PCP regarding diabetes.  Normal kidney function and sodium levels. Potassium mildly elevated Ensure avoiding electrolyte drinks, salt substitute, potassium supplement as they can all raise potassium levels.   Please inquire how swelling and BP have been.   Reduce Losartan to half tablet x 2 days then return to whole tablet. BMP Thurs or Friday this week for monitoring of potassium.

## 2023-03-04 DIAGNOSIS — E875 Hyperkalemia: Secondary | ICD-10-CM | POA: Diagnosis not present

## 2023-03-05 LAB — BASIC METABOLIC PANEL
BUN/Creatinine Ratio: 25 (ref 12–28)
BUN: 17 mg/dL (ref 8–27)
CO2: 26 mmol/L (ref 20–29)
Calcium: 10 mg/dL (ref 8.7–10.3)
Chloride: 101 mmol/L (ref 96–106)
Creatinine, Ser: 0.69 mg/dL (ref 0.57–1.00)
Glucose: 164 mg/dL — ABNORMAL HIGH (ref 70–99)
Potassium: 5.2 mmol/L (ref 3.5–5.2)
Sodium: 137 mmol/L (ref 134–144)
eGFR: 90 mL/min/{1.73_m2} (ref >59)

## 2023-03-07 ENCOUNTER — Encounter (HOSPITAL_BASED_OUTPATIENT_CLINIC_OR_DEPARTMENT_OTHER): Payer: Self-pay

## 2023-03-07 ENCOUNTER — Other Ambulatory Visit (HOSPITAL_BASED_OUTPATIENT_CLINIC_OR_DEPARTMENT_OTHER): Payer: Self-pay | Admitting: Family

## 2023-03-07 DIAGNOSIS — I119 Hypertensive heart disease without heart failure: Secondary | ICD-10-CM

## 2023-03-07 MED ORDER — LOSARTAN POTASSIUM-HCTZ 100-12.5 MG PO TABS: 1.0000 | ORAL_TABLET | Freq: Every day | ORAL | 3 refills | Status: DC

## 2023-03-09 ENCOUNTER — Encounter (HOSPITAL_BASED_OUTPATIENT_CLINIC_OR_DEPARTMENT_OTHER): Payer: Self-pay

## 2023-03-09 DIAGNOSIS — I119 Hypertensive heart disease without heart failure: Secondary | ICD-10-CM

## 2023-03-09 DIAGNOSIS — E875 Hyperkalemia: Secondary | ICD-10-CM

## 2023-03-09 NOTE — Telephone Encounter (Signed)
Discussed with Amber Crosby Patient needs repeat labs 1-2 weeks and follow up visit to discuss options given she will be on Trimethoprim moving forward

## 2023-03-11 DIAGNOSIS — H40012 Open angle with borderline findings, low risk, left eye: Secondary | ICD-10-CM | POA: Diagnosis not present

## 2023-03-11 DIAGNOSIS — H2513 Age-related nuclear cataract, bilateral: Secondary | ICD-10-CM | POA: Diagnosis not present

## 2023-03-11 DIAGNOSIS — H52203 Unspecified astigmatism, bilateral: Secondary | ICD-10-CM | POA: Diagnosis not present

## 2023-03-11 DIAGNOSIS — E119 Type 2 diabetes mellitus without complications: Secondary | ICD-10-CM | POA: Diagnosis not present

## 2023-03-23 DIAGNOSIS — I119 Hypertensive heart disease without heart failure: Secondary | ICD-10-CM | POA: Diagnosis not present

## 2023-03-23 DIAGNOSIS — E875 Hyperkalemia: Secondary | ICD-10-CM | POA: Diagnosis not present

## 2023-03-23 LAB — BASIC METABOLIC PANEL
BUN/Creatinine Ratio: 22 (ref 12–28)
BUN: 13 mg/dL (ref 8–27)
CO2: 24 mmol/L (ref 20–29)
Calcium: 9.3 mg/dL (ref 8.7–10.3)
Chloride: 97 mmol/L (ref 96–106)
Creatinine, Ser: 0.6 mg/dL (ref 0.57–1.00)
Glucose: 220 mg/dL — ABNORMAL HIGH (ref 70–99)
Potassium: 5 mmol/L (ref 3.5–5.2)
Sodium: 134 mmol/L (ref 134–144)
eGFR: 93 mL/min/{1.73_m2} (ref >59)

## 2023-03-25 ENCOUNTER — Encounter (HOSPITAL_BASED_OUTPATIENT_CLINIC_OR_DEPARTMENT_OTHER): Payer: Self-pay

## 2023-03-25 DIAGNOSIS — E875 Hyperkalemia: Secondary | ICD-10-CM

## 2023-03-28 NOTE — Addendum Note (Signed)
Addended by: Marlene Lard on: 03/28/2023 08:35 AM   Modules accepted: Orders

## 2023-04-01 DIAGNOSIS — E875 Hyperkalemia: Secondary | ICD-10-CM | POA: Diagnosis not present

## 2023-04-02 LAB — BASIC METABOLIC PANEL
BUN/Creatinine Ratio: 21 (ref 12–28)
BUN: 13 mg/dL (ref 8–27)
CO2: 24 mmol/L (ref 20–29)
Calcium: 9.6 mg/dL (ref 8.7–10.3)
Chloride: 98 mmol/L (ref 96–106)
Creatinine, Ser: 0.61 mg/dL (ref 0.57–1.00)
Glucose: 130 mg/dL — ABNORMAL HIGH (ref 70–99)
Potassium: 4.2 mmol/L (ref 3.5–5.2)
Sodium: 137 mmol/L (ref 134–144)
eGFR: 93 mL/min/{1.73_m2} (ref >59)

## 2023-04-02 LAB — MAGNESIUM: Magnesium: 1.8 mg/dL (ref 1.6–2.3)

## 2023-04-04 ENCOUNTER — Ambulatory Visit (HOSPITAL_BASED_OUTPATIENT_CLINIC_OR_DEPARTMENT_OTHER): Payer: Medicare Other | Admitting: Family

## 2023-04-22 DIAGNOSIS — M8588 Other specified disorders of bone density and structure, other site: Secondary | ICD-10-CM | POA: Diagnosis not present

## 2023-04-22 DIAGNOSIS — R600 Localized edema: Secondary | ICD-10-CM | POA: Diagnosis not present

## 2023-04-22 DIAGNOSIS — Z1231 Encounter for screening mammogram for malignant neoplasm of breast: Secondary | ICD-10-CM | POA: Diagnosis not present

## 2023-04-22 DIAGNOSIS — I8500 Esophageal varices without bleeding: Secondary | ICD-10-CM | POA: Diagnosis not present

## 2023-04-22 DIAGNOSIS — E1165 Type 2 diabetes mellitus with hyperglycemia: Secondary | ICD-10-CM | POA: Diagnosis not present

## 2023-04-22 DIAGNOSIS — E7800 Pure hypercholesterolemia, unspecified: Secondary | ICD-10-CM | POA: Diagnosis not present

## 2023-04-22 DIAGNOSIS — K7460 Unspecified cirrhosis of liver: Secondary | ICD-10-CM | POA: Diagnosis not present

## 2023-04-22 DIAGNOSIS — Z85038 Personal history of other malignant neoplasm of large intestine: Secondary | ICD-10-CM | POA: Diagnosis not present

## 2023-04-22 DIAGNOSIS — I81 Portal vein thrombosis: Secondary | ICD-10-CM | POA: Diagnosis not present

## 2023-04-22 DIAGNOSIS — K7682 Hepatic encephalopathy: Secondary | ICD-10-CM | POA: Diagnosis not present

## 2023-04-22 DIAGNOSIS — M810 Age-related osteoporosis without current pathological fracture: Secondary | ICD-10-CM | POA: Diagnosis not present

## 2023-04-22 DIAGNOSIS — I10 Essential (primary) hypertension: Secondary | ICD-10-CM | POA: Diagnosis not present

## 2023-04-22 DIAGNOSIS — D500 Iron deficiency anemia secondary to blood loss (chronic): Secondary | ICD-10-CM | POA: Diagnosis not present

## 2023-04-22 DIAGNOSIS — R29890 Loss of height: Secondary | ICD-10-CM | POA: Diagnosis not present

## 2023-04-22 DIAGNOSIS — E119 Type 2 diabetes mellitus without complications: Secondary | ICD-10-CM | POA: Diagnosis not present

## 2023-05-27 DIAGNOSIS — D485 Neoplasm of uncertain behavior of skin: Secondary | ICD-10-CM | POA: Diagnosis not present

## 2023-05-27 DIAGNOSIS — D3611 Benign neoplasm of peripheral nerves and autonomic nervous system of face, head, and neck: Secondary | ICD-10-CM | POA: Diagnosis not present

## 2023-05-27 DIAGNOSIS — L720 Epidermal cyst: Secondary | ICD-10-CM | POA: Diagnosis not present

## 2023-05-27 DIAGNOSIS — L570 Actinic keratosis: Secondary | ICD-10-CM | POA: Diagnosis not present

## 2023-06-02 DIAGNOSIS — N3020 Other chronic cystitis without hematuria: Secondary | ICD-10-CM | POA: Diagnosis not present

## 2023-07-12 DIAGNOSIS — L603 Nail dystrophy: Secondary | ICD-10-CM | POA: Diagnosis not present

## 2023-07-12 DIAGNOSIS — L570 Actinic keratosis: Secondary | ICD-10-CM | POA: Diagnosis not present

## 2023-08-12 DIAGNOSIS — I81 Portal vein thrombosis: Secondary | ICD-10-CM | POA: Diagnosis not present

## 2023-08-12 DIAGNOSIS — Z7984 Long term (current) use of oral hypoglycemic drugs: Secondary | ICD-10-CM | POA: Diagnosis not present

## 2023-08-12 DIAGNOSIS — Z7983 Long term (current) use of bisphosphonates: Secondary | ICD-10-CM | POA: Diagnosis not present

## 2023-08-12 DIAGNOSIS — Z23 Encounter for immunization: Secondary | ICD-10-CM | POA: Diagnosis not present

## 2023-08-12 DIAGNOSIS — K766 Portal hypertension: Secondary | ICD-10-CM | POA: Diagnosis not present

## 2023-08-12 DIAGNOSIS — Z7982 Long term (current) use of aspirin: Secondary | ICD-10-CM | POA: Diagnosis not present

## 2023-08-16 DIAGNOSIS — L603 Nail dystrophy: Secondary | ICD-10-CM | POA: Diagnosis not present

## 2023-08-16 DIAGNOSIS — D485 Neoplasm of uncertain behavior of skin: Secondary | ICD-10-CM | POA: Diagnosis not present

## 2023-08-16 DIAGNOSIS — L739 Follicular disorder, unspecified: Secondary | ICD-10-CM | POA: Diagnosis not present

## 2023-08-16 DIAGNOSIS — L859 Epidermal thickening, unspecified: Secondary | ICD-10-CM | POA: Diagnosis not present

## 2023-08-16 DIAGNOSIS — B351 Tinea unguium: Secondary | ICD-10-CM | POA: Diagnosis not present

## 2023-08-16 DIAGNOSIS — L719 Rosacea, unspecified: Secondary | ICD-10-CM | POA: Diagnosis not present

## 2023-08-16 DIAGNOSIS — L601 Onycholysis: Secondary | ICD-10-CM | POA: Diagnosis not present

## 2023-09-13 DIAGNOSIS — R062 Wheezing: Secondary | ICD-10-CM | POA: Diagnosis not present

## 2023-09-13 DIAGNOSIS — R051 Acute cough: Secondary | ICD-10-CM | POA: Diagnosis not present

## 2023-09-13 DIAGNOSIS — Z20822 Contact with and (suspected) exposure to covid-19: Secondary | ICD-10-CM | POA: Diagnosis not present

## 2023-09-13 DIAGNOSIS — B9789 Other viral agents as the cause of diseases classified elsewhere: Secondary | ICD-10-CM | POA: Diagnosis not present

## 2023-09-13 DIAGNOSIS — J988 Other specified respiratory disorders: Secondary | ICD-10-CM | POA: Diagnosis not present

## 2023-11-04 DIAGNOSIS — K7469 Other cirrhosis of liver: Secondary | ICD-10-CM | POA: Diagnosis not present

## 2023-11-04 DIAGNOSIS — Z85038 Personal history of other malignant neoplasm of large intestine: Secondary | ICD-10-CM | POA: Diagnosis not present

## 2023-12-13 DIAGNOSIS — E785 Hyperlipidemia, unspecified: Secondary | ICD-10-CM | POA: Diagnosis not present

## 2023-12-13 DIAGNOSIS — D500 Iron deficiency anemia secondary to blood loss (chronic): Secondary | ICD-10-CM | POA: Diagnosis not present

## 2023-12-13 DIAGNOSIS — E1169 Type 2 diabetes mellitus with other specified complication: Secondary | ICD-10-CM | POA: Diagnosis not present

## 2023-12-13 DIAGNOSIS — D696 Thrombocytopenia, unspecified: Secondary | ICD-10-CM | POA: Diagnosis not present

## 2023-12-13 DIAGNOSIS — I10 Essential (primary) hypertension: Secondary | ICD-10-CM | POA: Diagnosis not present

## 2023-12-13 DIAGNOSIS — Z85038 Personal history of other malignant neoplasm of large intestine: Secondary | ICD-10-CM | POA: Diagnosis not present

## 2023-12-13 DIAGNOSIS — Z0000 Encounter for general adult medical examination without abnormal findings: Secondary | ICD-10-CM | POA: Diagnosis not present

## 2023-12-13 DIAGNOSIS — M810 Age-related osteoporosis without current pathological fracture: Secondary | ICD-10-CM | POA: Diagnosis not present

## 2023-12-13 DIAGNOSIS — K7469 Other cirrhosis of liver: Secondary | ICD-10-CM | POA: Diagnosis not present

## 2023-12-13 DIAGNOSIS — I8500 Esophageal varices without bleeding: Secondary | ICD-10-CM | POA: Diagnosis not present

## 2024-01-04 DIAGNOSIS — H66003 Acute suppurative otitis media without spontaneous rupture of ear drum, bilateral: Secondary | ICD-10-CM | POA: Diagnosis not present

## 2024-01-04 DIAGNOSIS — H6123 Impacted cerumen, bilateral: Secondary | ICD-10-CM | POA: Diagnosis not present

## 2024-01-15 DIAGNOSIS — K766 Portal hypertension: Secondary | ICD-10-CM | POA: Diagnosis not present

## 2024-01-15 DIAGNOSIS — I81 Portal vein thrombosis: Secondary | ICD-10-CM | POA: Diagnosis not present

## 2024-01-19 DIAGNOSIS — L738 Other specified follicular disorders: Secondary | ICD-10-CM | POA: Diagnosis not present

## 2024-01-19 DIAGNOSIS — B351 Tinea unguium: Secondary | ICD-10-CM | POA: Diagnosis not present

## 2024-01-19 DIAGNOSIS — L719 Rosacea, unspecified: Secondary | ICD-10-CM | POA: Diagnosis not present

## 2024-01-23 DIAGNOSIS — R918 Other nonspecific abnormal finding of lung field: Secondary | ICD-10-CM | POA: Diagnosis not present

## 2024-02-14 ENCOUNTER — Encounter (HOSPITAL_BASED_OUTPATIENT_CLINIC_OR_DEPARTMENT_OTHER): Payer: Self-pay | Admitting: Family

## 2024-02-14 ENCOUNTER — Ambulatory Visit (HOSPITAL_BASED_OUTPATIENT_CLINIC_OR_DEPARTMENT_OTHER): Payer: Medicare Other | Admitting: Family

## 2024-02-14 VITALS — BP 110/70 | HR 75 | Ht 61.0 in | Wt 157.5 lb

## 2024-02-14 DIAGNOSIS — E782 Mixed hyperlipidemia: Secondary | ICD-10-CM

## 2024-02-14 DIAGNOSIS — I119 Hypertensive heart disease without heart failure: Secondary | ICD-10-CM | POA: Diagnosis not present

## 2024-02-14 MED ORDER — LOSARTAN POTASSIUM-HCTZ 100-12.5 MG PO TABS: 1.0000 | ORAL_TABLET | Freq: Every day | ORAL | 3 refills | Status: AC

## 2024-02-14 NOTE — Progress Notes (Unsigned)
  Cardiology Office Note:  .   Date:  02/14/2024  ID:  Amber Crosby, DOB Jan 25, 1946, MRN [id number] PCP: Rae Bugler, MD  [facility] Providers Cardiologist:  Maudine Sos, MD { Click to update primary MD,subspecialty MD or APP then REFRESH:1}   History of Present Illness: .   Amber Crosby is a 78 y.o. female ***  8 months ago Three vessel coronary atherosclerosis on CT 2013  Discussed the use of AI scribe software for clinical note transcription with the patient, who gave verbal consent to proceed.  History of Present Illness     ROS: Please see the history of present illness.    All other systems reviewed and are negative.   Studies Reviewed: Amber Crosby    EKG Interpretation Date/Time:  Tuesday Feb 14 2024 15:36:18 EDT Ventricular Rate:  75 PR Interval:  136 QRS Duration:  78 QT Interval:  388 QTC Calculation: 433 R Axis:   40  Text Interpretation: Normal sinus rhythm Normal ECG Confirmed by Amber Crosby ([id number]) on 02/14/2024 3:44:29 PM    *** Risk Assessment/Calculations:   {Does this patient have ATRIAL FIBRILLATION?:[phone]}         Physical Exam:   VS:  BP 110/70 (BP Location: Left Arm, Patient Position: Sitting, Cuff Size: Normal)   Pulse 75   Ht 5\' 1"  (1.549 m)   Wt 157 lb 8 oz (71.4 kg)   SpO2 98%   BMI 29.76 kg/m    Wt Readings from Last 3 Encounters:  02/14/24 157 lb 8 oz (71.4 kg)  02/17/23 146 lb (66.2 kg)  10/26/22 151 lb 14.4 oz (68.9 kg)    GEN: Well nourished, well developed in no acute distress NECK: No JVD; No carotid bruits CARDIAC: ***RRR, no murmurs, rubs, gallops RESPIRATORY:  Clear to auscultation without rales, wheezing or rhonchi  ABDOMEN: Soft, non-tender, non-distended EXTREMITIES:  No edema; No deformity   ASSESSMENT AND PLAN: .   ***  HTN HLD Portal vein  Assessment and Plan Assessment & Plan         {Are you ordering a CV Procedure (e.g. stress test, cath, DCCV, TEE, etc)?   Press F2         :[id number]}  Dispo: ***  Signed, Clearnce Curia, NP

## 2024-02-14 NOTE — Patient Instructions (Addendum)
 Medication Instructions:   STOP Amlodipine   *If you need a refill on your cardiac medications before your next appointment, please call your pharmacy*  Lab Work:  Ask liver team to check CBC (blood counts), fasting lipid panel, anemia panel (TIBC/ferritin/iron).  If you have labs (blood work) drawn today and your tests are completely normal, you will receive your results only by: MyChart Message (if you have MyChart) OR A paper copy in the mail If you have any lab test that is abnormal or we need to change your treatment, we will call you to review the results.  Testing/Procedures: Your EKG today shows normal sinus rhythm which is a good result!  Follow-Up: At [facility], you and your health needs are our priority.  As part of our continuing mission to provide you with exceptional heart care, our providers are all part of one team.  This team includes your primary Cardiologist (physician) and Advanced Practice Providers or APPs (Physician Assistants and Nurse Practitioners) who all work together to provide you with the care you need, when you need it.  Your next appointment:   1 year(s)  Provider:   Maudine Sos, MD, Slater Duncan, NP, or [name], NP    We recommend signing up for the patient portal called "MyChart".  Sign up information is provided on this After Visit Summary.  MyChart is used to connect with patients for Virtual Visits (Telemedicine).  Patients are able to view lab/test results, encounter notes, upcoming appointments, etc.  Non-urgent messages can be sent to your provider as well.   To learn more about what you can do with MyChart, go to [email or web address].   Other Instructions  Check blood pressure once per day for 2 weeks. We will check in with you about your blood pressure in 2 weeks via MyChart.

## 2024-02-17 DIAGNOSIS — R42 Dizziness and giddiness: Secondary | ICD-10-CM | POA: Diagnosis not present

## 2024-02-17 DIAGNOSIS — I10 Essential (primary) hypertension: Secondary | ICD-10-CM | POA: Diagnosis not present

## 2024-02-20 DIAGNOSIS — K766 Portal hypertension: Secondary | ICD-10-CM | POA: Diagnosis not present

## 2024-02-20 DIAGNOSIS — K7460 Unspecified cirrhosis of liver: Secondary | ICD-10-CM | POA: Diagnosis not present

## 2024-02-20 DIAGNOSIS — E559 Vitamin D deficiency, unspecified: Secondary | ICD-10-CM | POA: Diagnosis not present

## 2024-02-28 DIAGNOSIS — R42 Dizziness and giddiness: Secondary | ICD-10-CM | POA: Diagnosis not present

## 2024-03-07 DIAGNOSIS — R42 Dizziness and giddiness: Secondary | ICD-10-CM | POA: Diagnosis not present

## 2024-03-13 DIAGNOSIS — H524 Presbyopia: Secondary | ICD-10-CM | POA: Diagnosis not present

## 2024-03-13 DIAGNOSIS — E119 Type 2 diabetes mellitus without complications: Secondary | ICD-10-CM | POA: Diagnosis not present

## 2024-03-13 DIAGNOSIS — H40012 Open angle with borderline findings, low risk, left eye: Secondary | ICD-10-CM | POA: Diagnosis not present

## 2024-03-13 DIAGNOSIS — H2513 Age-related nuclear cataract, bilateral: Secondary | ICD-10-CM | POA: Diagnosis not present

## 2024-03-23 DIAGNOSIS — N390 Urinary tract infection, site not specified: Secondary | ICD-10-CM | POA: Diagnosis not present

## 2024-03-23 DIAGNOSIS — R319 Hematuria, unspecified: Secondary | ICD-10-CM | POA: Diagnosis not present

## 2024-03-23 DIAGNOSIS — H6121 Impacted cerumen, right ear: Secondary | ICD-10-CM | POA: Diagnosis not present

## 2024-03-23 DIAGNOSIS — R300 Dysuria: Secondary | ICD-10-CM | POA: Diagnosis not present

## 2024-03-25 NOTE — Telephone Encounter (Signed)
 Pt and daughter called regarding office visit on 03/23/24. Pt was diagnosed with UTI and prescribed Macrobid pending culture report. There was concern because pt was complaining of low grade fever and headache. Advised according to preliminary results Macrobid should cover strand of bacteria. Pt has taken 4 doses of Macrobid currently. Advised pt and daughter to continue to monitor pt as she could be developing something else unrelated to UTI and if fever increases or pt develops new or concerning symptoms that pt needs to be evaluated by either our office or PCP. They voiced understanding and will await final results call from nurse on urine culture.

## 2024-03-29 DIAGNOSIS — R42 Dizziness and giddiness: Secondary | ICD-10-CM | POA: Diagnosis not present

## 2024-03-30 DIAGNOSIS — E785 Hyperlipidemia, unspecified: Secondary | ICD-10-CM | POA: Diagnosis not present

## 2024-03-30 DIAGNOSIS — E1169 Type 2 diabetes mellitus with other specified complication: Secondary | ICD-10-CM | POA: Diagnosis not present

## 2024-03-30 DIAGNOSIS — M810 Age-related osteoporosis without current pathological fracture: Secondary | ICD-10-CM | POA: Diagnosis not present

## 2024-03-30 DIAGNOSIS — H6120 Impacted cerumen, unspecified ear: Secondary | ICD-10-CM | POA: Diagnosis not present

## 2024-03-30 DIAGNOSIS — I8500 Esophageal varices without bleeding: Secondary | ICD-10-CM | POA: Diagnosis not present

## 2024-03-30 DIAGNOSIS — I10 Essential (primary) hypertension: Secondary | ICD-10-CM | POA: Diagnosis not present

## 2024-03-30 DIAGNOSIS — N390 Urinary tract infection, site not specified: Secondary | ICD-10-CM | POA: Diagnosis not present

## 2024-03-30 DIAGNOSIS — H6090 Unspecified otitis externa, unspecified ear: Secondary | ICD-10-CM | POA: Diagnosis not present

## 2024-03-30 DIAGNOSIS — E559 Vitamin D deficiency, unspecified: Secondary | ICD-10-CM | POA: Diagnosis not present

## 2024-03-30 DIAGNOSIS — D500 Iron deficiency anemia secondary to blood loss (chronic): Secondary | ICD-10-CM | POA: Diagnosis not present

## 2024-03-30 DIAGNOSIS — D696 Thrombocytopenia, unspecified: Secondary | ICD-10-CM | POA: Diagnosis not present

## 2024-03-30 DIAGNOSIS — K7469 Other cirrhosis of liver: Secondary | ICD-10-CM | POA: Diagnosis not present

## 2024-03-30 NOTE — Telephone Encounter (Signed)
 Pts daughter called in with a clinical concern. She reports that the pt was seen by her PCP today and the PCP reviewed the UC sent during her visit to this clinic.   Pt was placed on macrobid during her visit and the culture showed a resistance to that medication. The results were reviewed by Guss Aye, PA-C and the pt was advised to continue prescribed medications.

## 2024-04-05 DIAGNOSIS — H2512 Age-related nuclear cataract, left eye: Secondary | ICD-10-CM | POA: Diagnosis not present

## 2024-04-05 DIAGNOSIS — H25812 Combined forms of age-related cataract, left eye: Secondary | ICD-10-CM | POA: Diagnosis not present

## 2024-04-09 DIAGNOSIS — N390 Urinary tract infection, site not specified: Secondary | ICD-10-CM | POA: Diagnosis not present

## 2024-04-12 DIAGNOSIS — H2511 Age-related nuclear cataract, right eye: Secondary | ICD-10-CM | POA: Diagnosis not present

## 2024-05-11 DIAGNOSIS — Z961 Presence of intraocular lens: Secondary | ICD-10-CM | POA: Diagnosis not present

## 2024-05-25 ENCOUNTER — Ambulatory Visit (INDEPENDENT_AMBULATORY_CARE_PROVIDER_SITE_OTHER): Admitting: Physician Assistant

## 2024-05-25 ENCOUNTER — Ambulatory Visit (INDEPENDENT_AMBULATORY_CARE_PROVIDER_SITE_OTHER): Admitting: Audiology

## 2024-05-25 ENCOUNTER — Encounter (INDEPENDENT_AMBULATORY_CARE_PROVIDER_SITE_OTHER): Payer: Self-pay | Admitting: Physician Assistant

## 2024-05-25 VITALS — BP 137/81 | HR 74

## 2024-05-25 DIAGNOSIS — R42 Dizziness and giddiness: Secondary | ICD-10-CM | POA: Diagnosis not present

## 2024-05-25 DIAGNOSIS — H6123 Impacted cerumen, bilateral: Secondary | ICD-10-CM | POA: Diagnosis not present

## 2024-05-25 DIAGNOSIS — H903 Sensorineural hearing loss, bilateral: Secondary | ICD-10-CM

## 2024-05-25 NOTE — Progress Notes (Signed)
  [address], [address] [place], [place] [address] [phone]  Audiological Evaluation    Name: Amber Crosby     DOB:   01-06-1946      MRN:   [id number]                                                                                     Service Date: 05/25/2024     Accompanied by: daughter   Patient comes today after Reyes Cohen, PA-C sent a referral for a hearing evaluation due to concerns with hearing loss.   Symptoms Yes Details  Hearing loss  [x]  Not perceived by the patient  Tinnitus  []    Ear pain/ infections/pressure  [x]  Reports had a right ear infection around March/April 2025  Balance problems  []    Noise exposure history  []    Previous ear surgeries  []    Family history of hearing loss  []    Amplification  []    Other  []      Otoscopy: Right ear: Clear external ear canal and notable landmarks visualized on the tympanic membrane. Left ear:  Clear external ear canal and notable landmarks visualized on the tympanic membrane.  Tympanometry: Right ear: Type C- Normal external ear canal volume with negative middle ear pressure and normal tympanic membrane compliance. Left ear: Type A- Normal external ear canal volume with normal middle ear pressure and tympanic membrane compliance.    Pure tone Audiometry: Right ear- Normal to moderately severe sensorineural hearing loss from 250 Hz - 8000 Hz. Left ear-  Normal to moderately severe sensorineural hearing loss from 250 Hz - 8000 Hz.  Speech Audiometry: Right ear- Speech Reception Threshold (SRT) was obtained at 5 dBHL. Left ear-Speech Reception Threshold (SRT) was obtained at 10 dBHL.   Word Recognition Score Tested using NU-6 (recorded) Right ear: 100% was obtained at a presentation level of 60 dBHL with contralateral masking which is deemed as  excellent. Left ear: 100% was obtained at a presentation level of 60 dBHL with contralateral masking which is deemed as  excellent.   The hearing test results were  completed under headphones and results are deemed to be of good reliability. Test technique:  conventional      Recommendations: Follow up with ENT as scheduled for today. Return for a hearing evaluation if concerns with hearing changes arise or per MD recommendation. Recommend hearing aid evaluation, when patient is ready.  Alec Mcphee MARIE LEROUX-MARTINEZ, AUD

## 2024-05-25 NOTE — Progress Notes (Addendum)
 Dear Dr. Wonda, Here is my assessment for our mutual patient, Amber Crosby. Thank you for allowing me the opportunity to care for your patient. Please do not hesitate to contact me should you have any other questions. Sincerely, Amber Cohen PA-C  Otolaryngology Clinic Note Referring provider: Dr. Wonda HPI:  Amber Crosby is a 78 y.o. female kindly referred by Dr. Wonda   The patient is a 78 year old female seen in our office for evaluation of dizziness.  The patient is accompanied by her daughter who helps provide some of the history.  She notes that she has had several episodes of dizziness.  She notes she has never had these before.  She notes that the first 1 started in May 2025, she felt like she rolled over in the bed and had some room spinning sensation.  She notes this took several days for it to resolve and a few weeks before she was back to her baseline without any difficulties with balance.  He notes complete resolution in the meantime, she had another event in August that was very similar.  She notes that she has been having some headaches recently that are intermittent, she notes she did have headaches prior to the dizziness, she describes these as frontal in nature with radiation to the back.  She denies any associated neurologic deficits.  She notes she has had dizziness without the headache as well; no history of actual migraines.  She denies any associated hearing loss or ringing.  She notes she was receiving some physical therapy where they were doing some maneuvers that sound like Epley maneuver which she notes did somewhat help her symptoms.     Independent Review of Additional Tests or Records:  Audiological evaluation on 05/25/2024  Otoscopy: Right ear: Clear external ear canal and notable landmarks visualized on the tympanic membrane. Left ear:  Clear external ear canal and notable landmarks visualized on the tympanic membrane.   Tympanometry: Right ear: Type C- Normal  external ear canal volume with negative middle ear pressure and normal tympanic membrane compliance. Left ear: Type A- Normal external ear canal volume with normal middle ear pressure and tympanic membrane compliance.     Pure tone Audiometry: Right ear- Normal to moderately severe sensorineural hearing loss from 250 Hz - 8000 Hz. Left ear-  Normal to moderately severe sensorineural hearing loss from 250 Hz - 8000 Hz.   Speech Audiometry: Right ear- Speech Reception Threshold (SRT) was obtained at 5 dBHL. Left ear-Speech Reception Threshold (SRT) was obtained at 10 dBHL.   Word Recognition Score Tested using NU-6 (recorded) Right ear: 100% was obtained at a presentation level of 60 dBHL with contralateral masking which is deemed as  excellent. Left ear: 100% was obtained at a presentation level of 60 dBHL with contralateral masking which is deemed as  excellent.   The hearing test results were completed under headphones and results are deemed to be of good reliability. Test technique:  conventional         PMH/Meds/All/SocHx/FamHx/ROS:   Past Medical History:  Diagnosis Date   Anxiety    Colon cancer (HCC)    Without evidence of recurrence   Diabetes mellitus    Type 2   Ear infection    Essential hypertension    Hemorrhoids    Internal-external small hemorrhoids   Hypercholesterolemia    Portal vein thrombosis 01/29/2020     Past Surgical History:  Procedure Laterality Date   ESOPHAGOGASTRODUODENOSCOPY   08/04/2010   VAGINAL HYSTERECTOMY  1979    Family History  Problem Relation Age of Onset   Hypertension Mother    Cancer Mother    Diabetes Mother      Social Connections: Not on file      Current Outpatient Medications:    amLODipine  (NORVASC ) 5 MG tablet, Take 5 mg by mouth daily., Disp: , Rfl:    alendronate (FOSAMAX) 70 MG tablet, Take 70 mg by mouth once a week. Friday, Disp: , Rfl:    ALPRAZolam  (XANAX ) 0.25 MG tablet, Take 1 tablet (0.25 mg total)  by mouth at bedtime as needed for anxiety., Disp: 90 tablet, Rfl: 0   aspirin  EC 81 MG tablet, Take 81 mg by mouth daily., Disp: , Rfl:    AZO D-MANNOSE PO, Take 4 capsules by mouth daily at 6 (six) AM., Disp: , Rfl:    Cholecalciferol (VITAMIN D PO), Take 1 tablet by mouth daily.  (Patient not taking: Reported on 02/14/2024), Disp: , Rfl:    cyanocobalamin  (VITAMIN B12) 1000 MCG tablet, Take 1,000 mcg by mouth daily. (Patient not taking: Reported on 02/14/2024), Disp: , Rfl:    estradiol (ESTRACE) 0.1 MG/GM vaginal cream, Place 1 Applicatorful vaginally 2 (two) times a week. (Patient not taking: Reported on 02/14/2024), Disp: , Rfl:    ferrous sulfate 325 (65 FE) MG tablet, Take 325 mg by mouth daily with breakfast. (Patient not taking: Reported on 02/14/2024), Disp: , Rfl:    lactulose  (CHRONULAC ) 10 GM/15ML solution, Take 15 mLs (10 g total) by mouth daily., Disp: 236 mL, Rfl: 0   losartan -hydrochlorothiazide  (HYZAAR) 100-12.5 MG tablet, Take 1 tablet by mouth daily., Disp: 90 tablet, Rfl: 3   metformin  (FORTAMET ) 500 MG (OSM) 24 hr tablet, Take 500 mg by mouth daily with breakfast. 1000mg  twice dqily, Disp: , Rfl:    nadolol  (CORGARD ) 40 MG tablet, Take 120 mg by mouth daily. 3 tabs daily, Disp: , Rfl:    ondansetron  (ZOFRAN  ODT) 4 MG disintegrating tablet, Take 1 tablet (4 mg total) by mouth every 8 (eight) hours as needed for nausea or vomiting., Disp: 12 tablet, Rfl: 1   rifaximin (XIFAXAN) 550 MG TABS tablet, Take 550 mg by mouth 2 (two) times daily., Disp: , Rfl:    rosuvastatin  (CRESTOR ) 10 MG tablet, Take 10 mg by mouth daily., Disp: , Rfl:    trimethoprim (TRIMPEX) 100 MG tablet, Take 100 mg by mouth at bedtime. (Patient not taking: Reported on 02/14/2024), Disp: , Rfl:    Physical Exam:   BP 137/81   Pulse 74   SpO2 98%   Pertinent Findings  CN II-XII intact- no nystagmus  Bilateral cerumen impaction Anterior rhinoscopy: Septum midline; bilateral inferior turbinates with  hypertrophy No lesions of oral cavity/oropharynx; dentition within normal limits No obviously palpable neck masses/lymphadenopathy/thyromegaly No respiratory distress or stridor  Seprately Identifiable Procedures:  Procedure: Bilateral ear microscopy and cerumen removal using microscope (CPT [phone]) - Mod 50 Pre-procedure diagnosis: bilateral cerumen impaction external auditory canals Post-procedure diagnosis: same Indication: bilateral cerumen impaction; given patient's otologic complaints and history as well as for improved and comprehensive examination of external ear and tympanic membrane, bilateral otologic examination using microscope was performed and impacted cerumen removed  Procedure: Patient was placed semi-recumbent. Both ear canals were examined using the microscope with findings above. Cerumen removed from bilateral external auditory canals using suction and currette with improvement in EAC examination and patency. Left: EAC was patent. TM was intact . Middle ear was aerated. Drainage: none Right: EAC was  patent. TM was intact . Middle ear was aerated . Drainage: none Patient tolerated the procedure well.   Impression & Plans:  Amber Crosby is a 78 y.o. female with the following   Dizziness-  78 year old female seen in our office for evaluation of dizziness.  Unclear etiology as she does have somewhat of a varied history.  She describes some spinning sensation, often triggered by movement.  Initially symptoms most consistent with BPPV the, she also had some improvement with physical therapy.  She is concerned that she is having intermittent headaches and there may be some association between the headache and the episodes of dizziness.  She has complete resolution of symptoms in between episodes, she has no associated neurologic deficits, low suspicion for any lesions, reassuring audiological evaluation with no significant asymmetry.  I do think that she would benefit most from  vestibular rehab as she did note some improvement.  Given her new headaches I do not think it is unreasonable to have evaluation by neurology as her daughter does have some concern for correlation between the headaches and the dizziness as well.  They note they have had family member see [facility] and would like a referral to that location.  The patient will follow-up with me in the office after physical therapy if she has persistent symptoms or will reach out immediately if she develops any new or worsening signs or symptoms.   - f/u PRN   Thank you for allowing me the opportunity to care for your patient. Please do not hesitate to contact me should you have any other questions.  Sincerely, Amber Cohen PA-C [facility] Phone: [phone] Fax: [phone]  05/25/2024, 9:31 AM

## 2024-05-30 ENCOUNTER — Encounter: Payer: Self-pay | Admitting: Neurology

## 2024-05-31 ENCOUNTER — Ambulatory Visit: Admitting: Neurology

## 2024-05-31 ENCOUNTER — Encounter: Payer: Self-pay | Admitting: Hematology and Oncology

## 2024-05-31 ENCOUNTER — Encounter: Payer: Self-pay | Admitting: Neurology

## 2024-05-31 ENCOUNTER — Telehealth: Payer: Self-pay | Admitting: Neurology

## 2024-05-31 VITALS — BP 147/80 | HR 80 | Ht 61.0 in | Wt 155.5 lb

## 2024-05-31 DIAGNOSIS — Z820 Family history of epilepsy and other diseases of the nervous system: Secondary | ICD-10-CM | POA: Diagnosis not present

## 2024-05-31 DIAGNOSIS — R42 Dizziness and giddiness: Secondary | ICD-10-CM | POA: Diagnosis not present

## 2024-05-31 DIAGNOSIS — R413 Other amnesia: Secondary | ICD-10-CM | POA: Diagnosis not present

## 2024-05-31 DIAGNOSIS — G3184 Mild cognitive impairment, so stated: Secondary | ICD-10-CM

## 2024-05-31 DIAGNOSIS — Z8679 Personal history of other diseases of the circulatory system: Secondary | ICD-10-CM

## 2024-05-31 NOTE — Telephone Encounter (Signed)
 no auth required sent to GI [phone]

## 2024-05-31 NOTE — Telephone Encounter (Signed)
 Accidental duplicate note

## 2024-05-31 NOTE — Progress Notes (Signed)
 [facility]  PATIENT: Amber Crosby DOB: [birth year]  REFERRING DOCTOR OR PCP: Reyes Cohen, PA-C; Alm Rav, MD SOURCE: Patient, note from primary care, lab results,  _________________________________   HISTORICAL  CHIEF COMPLAINT:  Chief Complaint  Patient presents with   New Patient (Initial Visit)    Pt in room 10. Daughter in room. Internal referral for Headache, dizziness.    HISTORY OF PRESENT ILLNESS:  I had the pleasure of seeing your patient, Amber Crosby, at [facility] for neurologic consultation regarding her reduced balance/vertigo and mild cognitive impairment.  She is a 78 year old woman with non-insulin -dependent diabetes mellitus, hypertension and portal hypertension due to liver disease (from chemotherapy).   She had 2 episodes of positional vertigo over the past few months..   The first episode was in May 2025 and she had one day of rotational vertigo with sitting up and oter position change.   This was better the next day but she continued to feel off balanced for a while.  A second episode with milder vertigo followed by 2 weeks of being off balanced.   She did the [employer] exercises and flt this was helpful.    Of note, her right ear felt stuffed up with the episodes.  She had an ear infection March 2025 and needed antibiotics and steroids.   With the first episode May 2025 and second episode last month, she was evaluated and there was no evidence of fluid in her ear (as with infection).     Currently, she is not having any vertigo.   She feels her balance is better but she is still being careful.      She reports some difficulty with short term memory, especially coming up with names.     She is not driving since the vertigo episode.  She had not had episodes of getting loss.   Her husband of nearly 60 years died last year.   She is sad but feels her grief is typical and she denies depression.  No suicidal ideation.     She is sleeping well.   She is a retired Engineer, civil (consulting).     She had colon cancer in 2006 and had chemotherapy.  She has had some liver issues and esophageal varices since the chemotherapy and feels strength is reduced.         05/31/2024    5:34 PM  Montreal Cognitive Assessment   Visuospatial/ Executive (0/5) 3  Naming (0/3) 3  Attention: Read list of digits (0/2) 1  Attention: Read list of letters (0/1) 1  Attention: Serial 7 subtraction starting at 100 (0/3) 2  Language: Repeat phrase (0/2) 2  Language : Fluency (0/1) 1  Abstraction (0/2) 2  Delayed Recall (0/5) 0  Orientation (0/6) 5  Total 20  Adjusted Score (based on education) 20       Vascular risks:  She has T2 NIDDM, HTN.  She d    She has portal hypertension from the liver changes.    REVIEW OF SYSTEMS: Constitutional: No fevers, chills, sweats, or change in appetite Eyes: No visual changes, double vision, eye pain Ear, nose and throat: No hearing loss, ear pain, nasal congestion, sore throat Cardiovascular: No chest pain, palpitations Respiratory:  No shortness of breath at rest or with exertion.   No wheezes GastrointestinaI: No nausea, vomiting, diarrhea, abdominal pain, fecal incontinence Genitourinary:  No dysuria, urinary retention or frequency.  No nocturia. Musculoskeletal:  No neck pain, back  pain Integumentary: No rash, pruritus, skin lesions Neurological: as above Psychiatric: No depression at this time.  No anxiety Endocrine: No palpitations, diaphoresis, change in appetite, change in weigh or increased thirst Hematologic/Lymphatic:  No anemia, purpura, petechiae. Allergic/Immunologic: No itchy/runny eyes, nasal congestion, recent allergic reactions, rashes  ALLERGIES: Allergies  Allergen Reactions   Meperidine And Related Nausea And Vomiting   Other Rash and Hives   Amoxicillin -Pot Clavulanate Other (See Comments)    Patient reports it caused a sore throat   Contrast Media [Iodinated Contrast Media]      Pt can not have IV dye under any circumstances   Iohexol     Pt had reaction (hives) even with pre-meds, so patient can not have IV dye for any CT    Morphine And Codeine     HOME MEDICATIONS:  Current Outpatient Medications:    alendronate (FOSAMAX) 70 MG tablet, Take 70 mg by mouth once a week. Friday, Disp: , Rfl:    ALPRAZolam  (XANAX ) 0.25 MG tablet, Take 1 tablet (0.25 mg total) by mouth at bedtime as needed for anxiety., Disp: 90 tablet, Rfl: 0   amLODipine  (NORVASC ) 5 MG tablet, Take 5 mg by mouth daily., Disp: , Rfl:    aspirin  EC 81 MG tablet, Take 81 mg by mouth daily., Disp: , Rfl:    AZO D-MANNOSE PO, Take 4 capsules by mouth daily at 6 (six) AM., Disp: , Rfl:    Cholecalciferol (VITAMIN D PO), Take 1 tablet by mouth daily. , Disp: , Rfl:    estradiol (ESTRACE) 0.1 MG/GM vaginal cream, Place 1 Applicatorful vaginally 2 (two) times a week., Disp: , Rfl:    ferrous sulfate 325 (65 FE) MG tablet, Take 325 mg by mouth daily with breakfast., Disp: , Rfl:    lactulose  (CHRONULAC ) 10 GM/15ML solution, Take 15 mLs (10 g total) by mouth daily., Disp: 236 mL, Rfl: 0   losartan -hydrochlorothiazide  (HYZAAR) 100-12.5 MG tablet, Take 1 tablet by mouth daily., Disp: 90 tablet, Rfl: 3   metformin  (FORTAMET ) 500 MG (OSM) 24 hr tablet, Take 500 mg by mouth daily with breakfast. 1000mg  twice dqily, Disp: , Rfl:    nadolol  (CORGARD ) 40 MG tablet, Take 120 mg by mouth daily. 3 tabs daily, Disp: , Rfl:    ondansetron  (ZOFRAN  ODT) 4 MG disintegrating tablet, Take 1 tablet (4 mg total) by mouth every 8 (eight) hours as needed for nausea or vomiting., Disp: 12 tablet, Rfl: 1   rifaximin (XIFAXAN) 550 MG TABS tablet, Take 550 mg by mouth 2 (two) times daily., Disp: , Rfl:    rosuvastatin  (CRESTOR ) 10 MG tablet, Take 10 mg by mouth daily., Disp: , Rfl:    trimethoprim (TRIMPEX) 100 MG tablet, Take 100 mg by mouth at bedtime., Disp: , Rfl:    cyanocobalamin  (VITAMIN B12) 1000 MCG tablet, Take 1,000  mcg by mouth daily. (Patient not taking: Reported on 05/31/2024), Disp: , Rfl:   PAST MEDICAL HISTORY: Past Medical History:  Diagnosis Date   Anxiety    Colon cancer (HCC)    Without evidence of recurrence   Diabetes mellitus    Type 2   Ear infection    Essential hypertension    Hemorrhoids    Internal-external small hemorrhoids   Hypercholesterolemia    Portal vein thrombosis 01/29/2020    PAST SURGICAL HISTORY: Past Surgical History:  Procedure Laterality Date   ESOPHAGOGASTRODUODENOSCOPY   08/04/2010   VAGINAL HYSTERECTOMY  1979    FAMILY HISTORY: Family History  Problem  Relation Age of Onset   Hypertension Mother    Cancer Mother    Diabetes Mother     SOCIAL HISTORY: Social History   Socioeconomic History   Marital status: Married    Spouse name: Not on file   Number of children: Not on file   Years of education: Not on file   Highest education level: Not on file  Occupational History   Not on file  Tobacco Use   Smoking status: Never   Smokeless tobacco: Never  Vaping Use   Vaping status: Never Used  Substance and Sexual Activity   Alcohol use: No   Drug use: No   Sexual activity: Not on file  Other Topics Concern   Not on file  Social History Narrative   Not on file   Social Drivers of Health   Financial Resource Strain: Not on file  Food Insecurity: No Food Insecurity (10/26/2022)   Hunger Vital Sign    Worried About Running Out of Food in the Last Year: Never true    Ran Out of Food in the Last Year: Never true  Transportation Needs: No Transportation Needs (10/26/2022)   PRAPARE - Administrator, Civil Service (Medical): No    Lack of Transportation (Non-Medical): No  Physical Activity: Not on file  Stress: Not on file  Social Connections: Not on file  Intimate Partner Violence: Not At Risk (02/20/2024)   Received from [facility]   Humiliation, Afraid, Rape, and Kick questionnaire    Within the last year, have you been  afraid of your partner or ex-partner?: No    Within the last year, have you been humiliated or emotionally abused in other ways by your partner or ex-partner?: No    Within the last year, have you been kicked, hit, slapped, or otherwise physically hurt by your partner or ex-partner?: No    Within the last year, have you been raped or forced to have any kind of sexual activity by your partner or ex-partner?: No       PHYSICAL EXAM  Vitals:   05/31/24 0928  BP: (!) 147/80  Pulse: 80  Weight: 155 lb 8 oz (70.5 kg)  Height: 5' 1 (1.549 m)    Body mass index is 29.38 kg/m.   General: The patient is well-developed and well-nourished and in no acute distress  HEENT:  Head is [place]/AT.  Sclera are anicteric.   Neck: No carotid bruits are noted.  The neck is nontender.  Cardiovascular: The heart has a regular rate and rhythm with a normal S1 and S2. There were no murmurs, gallops or rubs.    Skin: Extremities are without rash or  edema.  Musculoskeletal:  Back is nontender  Neurologic Exam  Mental status: The patient is alert and oriented x 2 at the time of the examination. The patient has reduced short-term memory (0/5, no improvement with category prompts) and reduced focus/attention [id number]).   Speech is normal.  Cranial nerves: Extraocular movements are full. Pupils are equal, round, and reactive to light and accomodation.  Visual fields are full.  Facial symmetry is present. There is good facial sensation to soft touch bilaterally.Facial strength is normal.  Trapezius and sternocleidomastoid strength is normal. No dysarthria is noted.  The tongue is midline, and the patient has symmetric elevation of the soft palate. No obvious hearing deficits are noted.  Motor:  Muscle bulk is normal.   Tone is normal. Strength is  5 / 5  in all 4 extremities.   Sensory: Sensory testing is intact to pinprick, soft touch and vibration sensation in all 4 extremities.  Coordination:  Cerebellar testing reveals good finger-nose-finger and heel-to-shin bilaterally.  Gait and station: Station is normal.   Gait is normal. Tandem gait is wide. Romberg is negative.   Reflexes: Deep tendon reflexes are symmetric and normal bilaterally.        DIAGNOSTIC DATA (LABS, IMAGING, TESTING) - I reviewed patient records, labs, notes, testing and imaging myself where available.  Lab Results  Component Value Date   WBC 2.6 (L) 10/26/2022   HGB 9.5 (L) 10/26/2022   HCT 27.8 (L) 10/26/2022   MCV 82.7 10/26/2022   PLT 74 (L) 10/26/2022      Component Value Date/Time   NA 137 04/01/2023 0914   NA 139 03/24/2012 1002   K 4.2 04/01/2023 0914   K 5.0 (H) 03/24/2012 1002   CL 98 04/01/2023 0914   CL 95 (L) 03/24/2012 1002   CO2 24 04/01/2023 0914   CO2 29 03/24/2012 1002   GLUCOSE 130 (H) 04/01/2023 0914   GLUCOSE 105 (H) 10/26/2022 2335   GLUCOSE 140 (H) 03/24/2012 1002   BUN 13 04/01/2023 0914   BUN 8 03/24/2012 1002   CREATININE 0.61 04/01/2023 0914   CREATININE 0.55 (L) 05/18/2016 1029   CALCIUM  9.6 04/01/2023 0914   CALCIUM  9.6 03/24/2012 1002   PROT 6.2 (L) 10/26/2022 2335   PROT 6.5 03/03/2018 0926   PROT 7.5 03/24/2012 1002   ALBUMIN 3.0 (L) 10/26/2022 2335   ALBUMIN 4.3 03/03/2018 0926   AST 36 10/26/2022 2335   AST 37 03/24/2012 1002   ALT 30 10/26/2022 2335   ALT 35 03/24/2012 1002   ALKPHOS 50 10/26/2022 2335   ALKPHOS 80 03/24/2012 1002   BILITOT 2.4 (H) 10/26/2022 2335   BILITOT 2.0 (H) 03/03/2018 0926   BILITOT 1.50 03/24/2012 1002   GFRNONAA >60 10/26/2022 2335   GFRNONAA >89 04/20/2014 1123   GFRAA >60 08/29/2018 1445   GFRAA >89 04/20/2014 1123   Lab Results  Component Value Date   CHOL 129 03/03/2018   HDL 60 03/03/2018   LDLCALC 60 03/03/2018   TRIG 47 03/03/2018   CHOLHDL 2.2 03/03/2018   Lab Results  Component Value Date   HGBA1C 6.9 (H) 10/06/2022   Lab Results  Component Value Date   VITAMINB12 527 10/26/2022   Lab Results   Component Value Date   TSH 0.273 (L) 10/26/2022       ASSESSMENT AND PLAN  Memory loss - Plan: APOE Alzheimer's Risk, ATN PROFILE, Vitamin B12, Thyroid  Panel With TSH  Mild cognitive impairment - Plan: APOE Alzheimer's Risk, ATN PROFILE, Vitamin B12, Thyroid  Panel With TSH, MR BRAIN WO CONTRAST  Vertigo - Plan: MR BRAIN WO CONTRAST  Family history of Alzheimer disease - Plan: APOE Alzheimer's Risk  History of subarachnoid hemorrhage - Plan: MR BRAIN WO CONTRAST   In summary, Ms. Kinkaid is a 78 year old woman with 2 episodes of positional vertigo and mild cognitive impairment.  As she still feels her balance is little bit off she will do some vestibular therapy.  But any medical treatment as she is not having the severe symptoms like she had initially.  I advised her to do the Brandt-Daroff exercises if these return and to let us  know  The second issue is mild cognitive impairment.   She scored 20/30 on the [facility] cognitive assessment and had 0/5 for delayed recall, even after  category prompts.  Etiology is uncertain.  She has amnestic MCI, we need to be concerned about Alzheimer's or other neurodegenerative process.  We will check the ATN biomarker profile, B12, TSH.  Based on the results we may want to consider adding donepezil.  It is also possible she has early vascular dementia.  She has severe white matter changes on her CT scan and I will check a MRI of the brain to further evaluate.  I would still consider donepezil and continued vigilance of comorbidities.  She will return to see us  in 6 months or sooner if there are new or worsening neurologic symptoms.  Thank you for asking me to see Ms. Harker.  Please let me know if I can be of further assistance with her patients in the future.   This visit is part of a comprehensive longitudinal care medical relationship regarding the patients primary diagnosis of MCI and related concerns.  Linah Klapper A. Vear, MD, [facility] 05/31/2024, 9:43  AM Certified in Neurology, Clinical Neurophysiology, Sleep Medicine and Neuroimaging  [facility] [address], [address] [place], [place] [address] [phone]

## 2024-06-13 ENCOUNTER — Ambulatory Visit: Payer: Self-pay | Admitting: Neurology

## 2024-06-13 LAB — ATN PROFILE
A -- Beta-amyloid 42/40 Ratio: 0.097 — ABNORMAL LOW (ref >0.102)
Beta-amyloid 40: 184.96 pg/mL
Beta-amyloid 42: 17.86 pg/mL
N -- NfL, Plasma: 3.63 pg/mL (ref 0.00–6.04)
T -- p-tau181: 0.98 pg/mL — ABNORMAL HIGH (ref 0.00–0.97)

## 2024-06-13 LAB — APOE ALZHEIMER'S RISK

## 2024-06-13 LAB — VITAMIN B12: Vitamin B-12: 624 pg/mL (ref 232–1245)

## 2024-06-13 LAB — THYROID PANEL WITH TSH
Free Thyroxine Index: 2.6 (ref 1.2–4.9)
T3 Uptake Ratio: 30 % (ref 24–39)
T4, Total: 8.8 ug/dL (ref 4.5–12.0)
TSH: 0.904 u[IU]/mL (ref 0.450–4.500)

## 2024-06-13 NOTE — Telephone Encounter (Addendum)
 Called pt's daughter and let her know the below Results per WID Dr. Buck, pt's daughter states that she was going to hold off medication until pt does her MRI and get those results back, but wants script to be sent to the pharmacy anyway.   ----- Message from True Buck sent at 06/13/2024 12:41 PM EDT ----- Thyroid  function panel benign and vitamin B12 level benign.  Her chemical profile and genetic marker testing for Alzheimer's dementia does support possible underlying Alzheimer's dementia.  They had  discussed starting her on low-dose donepezil.  If she wishes to start this, we can send a prescription to her pharmacy for donepezil 5 mg once daily in the evening.   Please advise patient that common side effects may include dry eyes, dry mouth, confusion, low pulse, low blood pressure, also GI related side effects (nausea, vomiting, diarrhea, constipation),  headaches; rare side effects may include hallucinations and seizures.   Please also assist with a follow-up appointment if she does not have 1 pending.  She was supposed to have a 62-month follow-up with Dr. Vear, may see NP.   ----- Message ----- From: Joshua Maurilio CROME, RN Sent: 06/13/2024   7:54 AM EDT To: True Buck, MD  Dr. Buck- you are WID this am. Dr. Vear pt. Please review   Dr. Vear mentioned prior to being out:if consistent with AD, add donepezil 5mg  po every day  ----- Message ----- From: Interface, [facility] Lab Results In Sent: 06/01/2024   7:38 AM EDT To: Charlie DELENA Vear, MD

## 2024-06-13 NOTE — Telephone Encounter (Signed)
 Since the brain MRI is coming up for tomorrow, we will hold off on the donepezil prescription until the results are in for the brain MRI.

## 2024-06-14 ENCOUNTER — Ambulatory Visit
Admission: RE | Admit: 2024-06-14 | Discharge: 2024-06-14 | Disposition: A | Discharge status: Home / Self Care | Source: Ambulatory Visit | Attending: Neurology | Admitting: Neurology

## 2024-06-14 DIAGNOSIS — R42 Dizziness and giddiness: Secondary | ICD-10-CM | POA: Diagnosis not present

## 2024-06-14 DIAGNOSIS — G3184 Mild cognitive impairment, so stated: Secondary | ICD-10-CM | POA: Diagnosis not present

## 2024-06-14 DIAGNOSIS — Z8679 Personal history of other diseases of the circulatory system: Secondary | ICD-10-CM

## 2024-06-18 ENCOUNTER — Telehealth: Payer: Self-pay

## 2024-06-18 DIAGNOSIS — H9203 Otalgia, bilateral: Secondary | ICD-10-CM | POA: Diagnosis not present

## 2024-06-18 DIAGNOSIS — H6993 Unspecified Eustachian tube disorder, bilateral: Secondary | ICD-10-CM | POA: Diagnosis not present

## 2024-06-18 NOTE — Telephone Encounter (Signed)
 Pt daughter called to request Nurse give Her a call back Pt daughter will like to discuss something's with Nurse about PT last MRI    CALLBACK NUMBER IS  [phone]

## 2024-06-18 NOTE — Telephone Encounter (Signed)
 I called and spoke to the patient's daughter. I informed her we received the MRI results and that we will call her back once the doctor reviews them.

## 2024-06-18 NOTE — Telephone Encounter (Signed)
 I spoke with the patient's daughter about the recent blood test and MRI.  The Alzheimer's disease biomarker studies showed reduced amyloid beta 42/40 ratio and increased [id number].  This pattern is consistent with Alzheimer's disease.  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    I will send in a prescription for donepezil.  Because she has liver disease I will have her start at 2.5 mg and then increase to 5 mg.  If she has no liver issues, we could consider increasing this dose further.  I also discussed the MRI of the brain.  It showed about 10-12 chronic microhemorrhages in a pattern that could be consistent with amyloid angiopathy.  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  This is in the same distribution as the subarachnoid hemorrhage from late 2023 and is likely related.  Of note, she had been on Eliquis  at the time.  She had had a fall and the subarachnoid hemorrhag was felt to be due to the fal.  She hit her nose and actually broke it but the rest of the head was not hit and there was no loss of consciousness.  Therefore, it is also possible that she had a spontaneous subarachnoid hemorrhage, possibly related to the amyloid angiopathy and this could have led to reduced balance or strength increasing the possibility of a fall.  I discussed with the daughter that due to these findings (and also being E4/E4), she is not a candidate for antiamyloid treatment  I discussed with the daughter that exercise and good cognitive/social activity could also help for Alzheimer's disease.  Additionally, since the daughter would likely have at least 1 copy of the E4, a good lifestyle earlier on in life might be helpful for her   .

## 2024-06-25 ENCOUNTER — Other Ambulatory Visit: Payer: Self-pay | Admitting: Neurology

## 2024-06-25 ENCOUNTER — Telehealth: Payer: Self-pay

## 2024-06-25 ENCOUNTER — Other Ambulatory Visit: Payer: Self-pay

## 2024-06-25 DIAGNOSIS — Z820 Family history of epilepsy and other diseases of the nervous system: Secondary | ICD-10-CM

## 2024-06-25 DIAGNOSIS — R413 Other amnesia: Secondary | ICD-10-CM

## 2024-06-25 DIAGNOSIS — G3184 Mild cognitive impairment, so stated: Secondary | ICD-10-CM

## 2024-06-25 MED ORDER — DONEPEZIL HCL 5 MG PO TABS: 5.0000 mg | ORAL_TABLET | Freq: Every day | ORAL | 11 refills | Status: DC

## 2024-06-25 MED ORDER — DONEPEZIL HCL 5 MG PO TABS: 5.0000 mg | ORAL_TABLET | Freq: Every day | ORAL | 11 refills | Status: AC

## 2024-06-25 NOTE — Telephone Encounter (Signed)
 Called pt's pharmacy and cancelled pt's Aricept 5 mg due to family wanting script sent to [facility] [address].   [facility] to [facility] [address].

## 2024-06-28 ENCOUNTER — Encounter: Payer: Self-pay | Admitting: Neurology

## 2024-07-04 ENCOUNTER — Ambulatory Visit: Admitting: Neurology

## 2024-09-22 ENCOUNTER — Encounter: Payer: Self-pay | Admitting: Neurology

## 2024-11-23 ENCOUNTER — Ambulatory Visit (INDEPENDENT_AMBULATORY_CARE_PROVIDER_SITE_OTHER): Admitting: Physician Assistant
